# Patient Record
Sex: Male | Born: 1980 | Race: Black or African American | Hispanic: No | Marital: Married | Smoking: Former smoker
Health system: Southern US, Community
[De-identification: ages and names within clinical notes are randomized; demographics above are authoritative.]

## PROBLEM LIST (undated history)

## (undated) DIAGNOSIS — I10 Essential (primary) hypertension: Secondary | ICD-10-CM

## (undated) DIAGNOSIS — J45909 Unspecified asthma, uncomplicated: Secondary | ICD-10-CM

## (undated) DIAGNOSIS — G4730 Sleep apnea, unspecified: Secondary | ICD-10-CM

## (undated) HISTORY — DX: Essential (primary) hypertension: I10

---
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
## 2002-06-22 ENCOUNTER — Emergency Department (HOSPITAL_COMMUNITY): Admission: EM | Admit: 2002-06-22 | Discharge: 2002-06-22 | Payer: Self-pay | Admitting: Emergency Medicine

## 2002-10-02 ENCOUNTER — Emergency Department (HOSPITAL_COMMUNITY): Admission: EM | Admit: 2002-10-02 | Discharge: 2002-10-02 | Payer: Self-pay | Admitting: Emergency Medicine

## 2002-10-02 ENCOUNTER — Encounter: Payer: Self-pay | Admitting: Emergency Medicine

## 2002-10-14 ENCOUNTER — Emergency Department (HOSPITAL_COMMUNITY): Admission: EM | Admit: 2002-10-14 | Discharge: 2002-10-14 | Payer: Self-pay | Admitting: Emergency Medicine

## 2002-10-14 ENCOUNTER — Encounter: Payer: Self-pay | Admitting: Emergency Medicine

## 2003-09-22 ENCOUNTER — Emergency Department (HOSPITAL_COMMUNITY): Admission: EM | Admit: 2003-09-22 | Discharge: 2003-09-22 | Payer: Self-pay

## 2004-05-15 ENCOUNTER — Ambulatory Visit: Payer: Self-pay | Admitting: Nurse Practitioner

## 2004-06-14 ENCOUNTER — Emergency Department (HOSPITAL_COMMUNITY): Admission: EM | Admit: 2004-06-14 | Discharge: 2004-06-14 | Payer: Self-pay | Admitting: Emergency Medicine

## 2004-07-11 ENCOUNTER — Emergency Department (HOSPITAL_COMMUNITY): Admission: EM | Admit: 2004-07-11 | Discharge: 2004-07-11 | Payer: Self-pay | Admitting: Family Medicine

## 2005-04-09 ENCOUNTER — Emergency Department (HOSPITAL_COMMUNITY): Admission: EM | Admit: 2005-04-09 | Discharge: 2005-04-09 | Payer: Self-pay | Admitting: Family Medicine

## 2005-09-02 ENCOUNTER — Emergency Department (HOSPITAL_COMMUNITY): Admission: EM | Admit: 2005-09-02 | Discharge: 2005-09-03 | Payer: Self-pay | Admitting: Emergency Medicine

## 2005-10-24 ENCOUNTER — Emergency Department (HOSPITAL_COMMUNITY): Admission: EM | Admit: 2005-10-24 | Discharge: 2005-10-24 | Payer: Self-pay | Admitting: Family Medicine

## 2006-10-20 ENCOUNTER — Emergency Department (HOSPITAL_COMMUNITY): Admission: EM | Admit: 2006-10-20 | Discharge: 2006-10-20 | Payer: Self-pay | Admitting: Emergency Medicine

## 2006-10-26 ENCOUNTER — Emergency Department (HOSPITAL_COMMUNITY): Admission: EM | Admit: 2006-10-26 | Discharge: 2006-10-26 | Payer: Self-pay | Admitting: Family Medicine

## 2006-12-15 ENCOUNTER — Emergency Department (HOSPITAL_COMMUNITY): Admission: EM | Admit: 2006-12-15 | Discharge: 2006-12-15 | Payer: Self-pay | Admitting: Emergency Medicine

## 2007-08-19 ENCOUNTER — Emergency Department (HOSPITAL_COMMUNITY): Admission: EM | Admit: 2007-08-19 | Discharge: 2007-08-19 | Payer: Self-pay | Admitting: Emergency Medicine

## 2008-03-23 ENCOUNTER — Emergency Department (HOSPITAL_COMMUNITY): Admission: EM | Admit: 2008-03-23 | Discharge: 2008-03-23 | Payer: Self-pay | Admitting: Emergency Medicine

## 2008-03-26 ENCOUNTER — Emergency Department (HOSPITAL_COMMUNITY): Admission: EM | Admit: 2008-03-26 | Discharge: 2008-03-26 | Payer: Self-pay | Admitting: Family Medicine

## 2009-05-11 ENCOUNTER — Emergency Department (HOSPITAL_COMMUNITY): Admission: EM | Admit: 2009-05-11 | Discharge: 2009-05-11 | Payer: Self-pay | Admitting: Emergency Medicine

## 2010-04-25 ENCOUNTER — Emergency Department (HOSPITAL_COMMUNITY): Admission: EM | Admit: 2010-04-25 | Discharge: 2010-04-25 | Payer: Self-pay | Admitting: Family Medicine

## 2011-03-21 ENCOUNTER — Emergency Department (HOSPITAL_COMMUNITY)
Admission: EM | Admit: 2011-03-21 | Discharge: 2011-03-22 | Disposition: A | Discharge status: Home / Self Care | Payer: Self-pay | Attending: Emergency Medicine | Admitting: Emergency Medicine

## 2011-03-21 DIAGNOSIS — R110 Nausea: Secondary | ICD-10-CM | POA: Insufficient documentation

## 2011-03-21 DIAGNOSIS — R0602 Shortness of breath: Secondary | ICD-10-CM | POA: Insufficient documentation

## 2011-03-21 DIAGNOSIS — R0789 Other chest pain: Secondary | ICD-10-CM | POA: Insufficient documentation

## 2011-03-21 DIAGNOSIS — J45909 Unspecified asthma, uncomplicated: Secondary | ICD-10-CM | POA: Insufficient documentation

## 2011-03-22 ENCOUNTER — Emergency Department (HOSPITAL_COMMUNITY): Payer: Self-pay

## 2011-05-30 LAB — CULTURE, ROUTINE-ABSCESS

## 2013-03-07 ENCOUNTER — Encounter (HOSPITAL_COMMUNITY): Payer: Self-pay | Admitting: Emergency Medicine

## 2013-03-07 ENCOUNTER — Other Ambulatory Visit (HOSPITAL_COMMUNITY)
Admission: RE | Admit: 2013-03-07 | Discharge: 2013-03-07 | Disposition: A | Discharge status: Home / Self Care | Payer: Self-pay | Source: Ambulatory Visit | Attending: Emergency Medicine | Admitting: Emergency Medicine

## 2013-03-07 ENCOUNTER — Emergency Department (INDEPENDENT_AMBULATORY_CARE_PROVIDER_SITE_OTHER)
Admission: EM | Admit: 2013-03-07 | Discharge: 2013-03-07 | Disposition: A | Discharge status: Home / Self Care | Payer: Self-pay | Source: Home / Self Care

## 2013-03-07 DIAGNOSIS — Z9189 Other specified personal risk factors, not elsewhere classified: Secondary | ICD-10-CM

## 2013-03-07 DIAGNOSIS — Z202 Contact with and (suspected) exposure to infections with a predominantly sexual mode of transmission: Secondary | ICD-10-CM

## 2013-03-07 DIAGNOSIS — Z113 Encounter for screening for infections with a predominantly sexual mode of transmission: Secondary | ICD-10-CM | POA: Insufficient documentation

## 2013-03-07 HISTORY — DX: Unspecified asthma, uncomplicated: J45.909

## 2013-03-07 LAB — POCT URINALYSIS DIP (DEVICE)
Bilirubin Urine: NEGATIVE
Glucose, UA: NEGATIVE mg/dL
Ketones, ur: NEGATIVE mg/dL
Leukocytes, UA: NEGATIVE
Nitrite: NEGATIVE

## 2013-03-07 LAB — HIV ANTIBODY (ROUTINE TESTING W REFLEX): HIV: NONREACTIVE

## 2013-03-07 LAB — RPR: RPR Ser Ql: NONREACTIVE

## 2013-03-07 MED ORDER — CEFTRIAXONE SODIUM 1 G IJ SOLR
INTRAMUSCULAR | Status: AC
  Filled 2013-03-07: qty 10

## 2013-03-07 MED ORDER — LIDOCAINE HCL (PF) 1 % IJ SOLN
INTRAMUSCULAR | Status: AC
  Filled 2013-03-07: qty 5

## 2013-03-07 MED ORDER — CEFTRIAXONE SODIUM 1 G IJ SOLR
1.0000 g | Freq: Once | INTRAMUSCULAR | Status: AC
  Administered 2013-03-07: 1 g via INTRAMUSCULAR

## 2013-03-07 MED ORDER — AZITHROMYCIN 250 MG PO TABS
ORAL_TABLET | ORAL | Status: AC
  Filled 2013-03-07: qty 4

## 2013-03-07 MED ORDER — AZITHROMYCIN 250 MG PO TABS
1000.0000 mg | ORAL_TABLET | Freq: Once | ORAL | Status: AC
  Administered 2013-03-07: 1000 mg via ORAL

## 2013-03-07 NOTE — ED Notes (Signed)
Pt c/o discharge and itching on penis x 1 day. Had unprotected sex x 4 days ago. Denies abdominal pain, fever, or rash. No burning with urination. Patient is alert and oriented.

## 2013-03-11 NOTE — ED Provider Notes (Signed)
   History    CSN: [id number] Arrival date & time 03/07/13  1054  First MD Initiated Contact with Patient 03/07/13 1255     Chief Complaint  Patient presents with  . SEXUALLY TRANSMITTED DISEASE   (Consider location/radiation/quality/duration/timing/severity/associated sxs/prior Treatment) HPI   32 yo bm presents with penile discharge and itching x one day.  Had unprotected sex 4 days ago.  Discharge is white.  No dysuria, hematuria, abd pain, testicular pain.  No other complaints.   Past Medical History  Diagnosis Date  . Asthma    History reviewed. No pertinent past surgical history. History reviewed. No pertinent family history. History  Substance Use Topics  . Smoking status: Current Every Day Smoker -- 1.00 packs/day    Types: Cigarettes  . Smokeless tobacco: Not on file  . Alcohol Use: Yes     Comment: occasionally    Review of Systems  Constitutional: Negative.   HENT: Negative.   Eyes: Negative.   Respiratory: Negative.   Cardiovascular: Negative.   Gastrointestinal: Negative.   Endocrine: Negative.   Genitourinary: Positive for discharge. Negative for dysuria, urgency, frequency, hematuria, flank pain, decreased urine volume, penile swelling, scrotal swelling, enuresis, genital sores, penile pain and testicular pain.  Neurological: Negative.   Psychiatric/Behavioral: Negative.     Allergies  Review of patient's allergies indicates no known allergies.  Home Medications  No current outpatient prescriptions on file. BP 157/91  Pulse 64  Temp(Src) 98 F (36.7 C) (Oral)  Resp 14  SpO2 97% Physical Exam  Constitutional: He is oriented to person, place, and time. He appears well-developed and well-nourished.  HENT:  Head: Normocephalic and atraumatic.  Eyes: EOM are normal. Pupils are equal, round, and reactive to light.  Neck: Normal range of motion.  Cardiovascular: Normal rate and regular rhythm.   Pulmonary/Chest: Effort normal and breath sounds normal.   Abdominal: Soft. He exhibits no distension and no mass. There is no tenderness. There is no rebound and no guarding.  Genitourinary: Penis normal. No penile tenderness.  Musculoskeletal: Normal range of motion.  Neurological: He is alert and oriented to person, place, and time.  Skin: Skin is warm and dry.  Psychiatric: He has a normal mood and affect.    ED Course  Procedures (including critical care time) Labs Reviewed  RPR  HIV ANTIBODY (ROUTINE TESTING)  POCT URINALYSIS DIP (DEVICE)  URINE CYTOLOGY ANCILLARY ONLY   No results found. 1. Possible exposure to STD     MDM  Will go ahead and treat for possible urethritis.  Will contact him with results.  Return 3-5 days if continues to be symptomatic.  All questions answered.   Meds ordered this encounter  Medications  . cefTRIAXone (ROCEPHIN) injection 1 g    Sig:   . azithromycin (ZITHROMAX) tablet 1,000 mg    Sig:     Zonia Kief, PA-C 03/11/13 1425  Zonia Kief, PA-C 03/11/13 1426

## 2013-03-13 NOTE — ED Provider Notes (Signed)
Medical screening examination/treatment/procedure(s) were performed by non-physician practitioner and as supervising physician I was immediately available for consultation/collaboration.  Leslee Home, M.D.   Reuben Likes, MD 03/13/13 [phone]

## 2013-04-16 ENCOUNTER — Emergency Department (HOSPITAL_COMMUNITY)
Admission: EM | Admit: 2013-04-16 | Discharge: 2013-04-16 | Disposition: A | Discharge status: Home / Self Care | Payer: Self-pay | Attending: Emergency Medicine | Admitting: Emergency Medicine

## 2013-04-16 ENCOUNTER — Encounter (HOSPITAL_COMMUNITY): Payer: Self-pay | Admitting: *Deleted

## 2013-04-16 ENCOUNTER — Emergency Department (HOSPITAL_COMMUNITY): Payer: Self-pay

## 2013-04-16 DIAGNOSIS — J069 Acute upper respiratory infection, unspecified: Secondary | ICD-10-CM

## 2013-04-16 DIAGNOSIS — R6883 Chills (without fever): Secondary | ICD-10-CM | POA: Insufficient documentation

## 2013-04-16 DIAGNOSIS — R079 Chest pain, unspecified: Secondary | ICD-10-CM | POA: Insufficient documentation

## 2013-04-16 DIAGNOSIS — J45901 Unspecified asthma with (acute) exacerbation: Secondary | ICD-10-CM | POA: Insufficient documentation

## 2013-04-16 DIAGNOSIS — R0981 Nasal congestion: Secondary | ICD-10-CM

## 2013-04-16 DIAGNOSIS — J029 Acute pharyngitis, unspecified: Secondary | ICD-10-CM | POA: Insufficient documentation

## 2013-04-16 DIAGNOSIS — R609 Edema, unspecified: Secondary | ICD-10-CM | POA: Insufficient documentation

## 2013-04-16 DIAGNOSIS — F17200 Nicotine dependence, unspecified, uncomplicated: Secondary | ICD-10-CM | POA: Insufficient documentation

## 2013-04-16 DIAGNOSIS — J40 Bronchitis, not specified as acute or chronic: Secondary | ICD-10-CM

## 2013-04-16 DIAGNOSIS — J3489 Other specified disorders of nose and nasal sinuses: Secondary | ICD-10-CM | POA: Insufficient documentation

## 2013-04-16 DIAGNOSIS — R0602 Shortness of breath: Secondary | ICD-10-CM | POA: Insufficient documentation

## 2013-04-16 LAB — CBC WITH DIFFERENTIAL/PLATELET
Basophils Absolute: 0.1 10*3/uL (ref 0.0–0.1)
Basophils Relative: 1 % (ref 0–1)
Eosinophils Absolute: 0.6 K/uL (ref 0.0–0.7)
Eosinophils Relative: 7 % — ABNORMAL HIGH (ref 0–5)
HCT: 46.8 % (ref 39.0–52.0)
Hemoglobin: 16.4 g/dL (ref 13.0–17.0)
Lymphocytes Relative: 20 % (ref 12–46)
Lymphs Abs: 1.8 10*3/uL (ref 0.7–4.0)
MCH: 29 pg (ref 26.0–34.0)
MCHC: 35 g/dL (ref 30.0–36.0)
MCV: 82.8 fL (ref 78.0–100.0)
Monocytes Absolute: 1 10*3/uL (ref 0.1–1.0)
Monocytes Relative: 11 % (ref 3–12)
Neutro Abs: 5.5 10*3/uL (ref 1.7–7.7)
Neutrophils Relative %: 61 % (ref 43–77)
Platelets: 294 10*3/uL (ref 150–400)
RBC: 5.65 MIL/uL (ref 4.22–5.81)
RDW: 13.5 % (ref 11.5–15.5)
WBC: 9.1 10*3/uL (ref 4.0–10.5)

## 2013-04-16 LAB — BASIC METABOLIC PANEL WITH GFR
BUN: 9 mg/dL (ref 6–23)
Calcium: 9.6 mg/dL (ref 8.4–10.5)
Creatinine, Ser: 1 mg/dL (ref 0.50–1.35)
GFR calc non Af Amer: >90 mL/min (ref >90)
Glucose, Bld: 112 mg/dL — ABNORMAL HIGH (ref 70–99)
Potassium: 4.1 meq/L (ref 3.5–5.1)

## 2013-04-16 LAB — BASIC METABOLIC PANEL
CO2: 24 mEq/L (ref 19–32)
Chloride: 103 mEq/L (ref 96–112)
GFR calc Af Amer: >90 mL/min (ref >90)
Sodium: 137 mEq/L (ref 135–145)

## 2013-04-16 LAB — POCT I-STAT TROPONIN I: Troponin i, poc: 0.01 ng/mL (ref 0.00–0.08)

## 2013-04-16 MED ORDER — PREDNISONE 20 MG PO TABS
60.0000 mg | ORAL_TABLET | Freq: Once | ORAL | Status: AC
  Administered 2013-04-16: 60 mg via ORAL
  Filled 2013-04-16: qty 3

## 2013-04-16 MED ORDER — ALBUTEROL SULFATE HFA 108 (90 BASE) MCG/ACT IN AERS
1.0000 | INHALATION_SPRAY | Freq: Four times a day (QID) | RESPIRATORY_TRACT | Status: DC | PRN
  Administered 2013-04-16: 2 via RESPIRATORY_TRACT
  Filled 2013-04-16: qty 6.7

## 2013-04-16 MED ORDER — HYDROCOD POLST-CHLORPHEN POLST 10-8 MG/5ML PO LQCR: 5.0000 mL | Freq: Two times a day (BID) | ORAL | Status: DC | PRN

## 2013-04-16 MED ORDER — PREDNISONE 20 MG PO TABS: 40.0000 mg | ORAL_TABLET | Freq: Every day | ORAL | Status: DC

## 2013-04-16 MED ORDER — TRIAMCINOLONE ACETONIDE(NASAL) 55 MCG/ACT NA INHA: 2.0000 | Freq: Every day | NASAL | Status: DC

## 2013-04-16 NOTE — ED Notes (Signed)
Pt with cough and upper airway congestion x 2 days.  States chest and back (beneath R shoulder blade) began hurting yesterday, esp when coughing.  States coughing up yellow sputum.

## 2013-04-16 NOTE — Discharge Instructions (Signed)
 Bronchitis Bronchitis is the body's way of reacting to injury and/or infection (inflammation) of the bronchi. Bronchi are the air tubes that extend from the windpipe into the lungs. If the inflammation becomes severe, it may cause shortness of breath. CAUSES  Inflammation may be caused by:  A virus.  Germs (bacteria).  Dust.  Allergens.  Pollutants and many other irritants. The cells lining the bronchial tree are covered with tiny hairs (cilia). These constantly beat upward, away from the lungs, toward the mouth. This keeps the lungs free of pollutants. When these cells become too irritated and are unable to do their job, mucus begins to develop. This causes the characteristic cough of bronchitis. The cough clears the lungs when the cilia are unable to do their job. Without either of these protective mechanisms, the mucus would settle in the lungs. Then you would develop pneumonia. Smoking is a common cause of bronchitis and can contribute to pneumonia. Stopping this habit is the single most important thing you can do to help yourself. TREATMENT   Your caregiver may prescribe an antibiotic if the cough is caused by bacteria. Also, medicines that open up your airways make it easier to breathe. Your caregiver may also recommend or prescribe an expectorant. It will loosen the mucus to be coughed up. Only take over-the-counter or prescription medicines for pain, discomfort, or fever as directed by your caregiver.  Removing whatever causes the problem (smoking, for example) is critical to preventing the problem from getting worse.  Cough suppressants may be prescribed for relief of cough symptoms.  Inhaled medicines may be prescribed to help with symptoms now and to help prevent problems from returning.  For those with recurrent (chronic) bronchitis, there may be a need for steroid medicines. SEEK IMMEDIATE MEDICAL CARE IF:   During treatment, you develop more pus-like mucus (purulent  sputum).  You have a fever.  Your baby is older than 3 months with a rectal temperature of 102 F (38.9 C) or higher.  Your baby is 42 months old or younger with a rectal temperature of 100.4 F (38 C) or higher.  You become progressively more ill.  You have increased difficulty breathing, wheezing, or shortness of breath. It is necessary to seek immediate medical care if you are elderly or sick from any other disease. MAKE SURE YOU:   Understand these instructions.  Will watch your condition.  Will get help right away if you are not doing well or get worse. Document Released: 08/18/2005 Document Revised: 11/10/2011 Document Reviewed: 06/27/2008 [facility] Patient Information 2014 [place], [place].    Upper Respiratory Infection, Adult An upper respiratory infection (URI) is also sometimes known as the common cold. The upper respiratory tract includes the nose, sinuses, throat, trachea, and bronchi. Bronchi are the airways leading to the lungs. Most people improve within 1 week, but symptoms can last up to 2 weeks. A residual cough may last even longer.  CAUSES Many different viruses can infect the tissues lining the upper respiratory tract. The tissues become irritated and inflamed and often become very moist. Mucus production is also common. A cold is contagious. You can easily spread the virus to others by oral contact. This includes kissing, sharing a glass, coughing, or sneezing. Touching your mouth or nose and then touching a surface, which is then touched by another person, can also spread the virus. SYMPTOMS  Symptoms typically develop 1 to 3 days after you come in contact with a cold virus. Symptoms vary from person to person.  They may include:  Runny nose.  Sneezing.  Nasal congestion.  Sinus irritation.  Sore throat.  Loss of voice (laryngitis).  Cough.  Fatigue.  Muscle aches.  Loss of appetite.  Headache.  Low-grade fever. DIAGNOSIS  You might  diagnose your own cold based on familiar symptoms, since most people get a cold 2 to 3 times a year. Your caregiver can confirm this based on your exam. Most importantly, your caregiver can check that your symptoms are not due to another disease such as strep throat, sinusitis, pneumonia, asthma, or epiglottitis. Blood tests, throat tests, and X-rays are not necessary to diagnose a common cold, but they may sometimes be helpful in excluding other more serious diseases. Your caregiver will decide if any further tests are required. RISKS AND COMPLICATIONS  You may be at risk for a more severe case of the common cold if you smoke cigarettes, have chronic heart disease (such as heart failure) or lung disease (such as asthma), or if you have a weakened immune system. The very young and very old are also at risk for more serious infections. Bacterial sinusitis, middle ear infections, and bacterial pneumonia can complicate the common cold. The common cold can worsen asthma and chronic obstructive pulmonary disease (COPD). Sometimes, these complications can require emergency medical care and may be life-threatening. PREVENTION  The best way to protect against getting a cold is to practice good hygiene. Avoid oral or hand contact with people with cold symptoms. Wash your hands often if contact occurs. There is no clear evidence that vitamin C, vitamin E, echinacea, or exercise reduces the chance of developing a cold. However, it is always recommended to get plenty of rest and practice good nutrition. TREATMENT  Treatment is directed at relieving symptoms. There is no cure. Antibiotics are not effective, because the infection is caused by a virus, not by bacteria. Treatment may include:  Increased fluid intake. Sports drinks offer valuable electrolytes, sugars, and fluids.  Breathing heated mist or steam (vaporizer or shower).  Eating chicken soup or other clear broths, and maintaining good nutrition.  Getting  plenty of rest.  Using gargles or lozenges for comfort.  Controlling fevers with ibuprofen  or acetaminophen  as directed by your caregiver.  Increasing usage of your inhaler if you have asthma. Zinc gel and zinc lozenges, taken in the first 24 hours of the common cold, can shorten the duration and lessen the severity of symptoms. Pain medicines may help with fever, muscle aches, and throat pain. A variety of non-prescription medicines are available to treat congestion and runny nose. Your caregiver can make recommendations and may suggest nasal or lung inhalers for other symptoms.  HOME CARE INSTRUCTIONS   Only take over-the-counter or prescription medicines for pain, discomfort, or fever as directed by your caregiver.  Use a warm mist humidifier or inhale steam from a shower to increase air moisture. This may keep secretions moist and make it easier to breathe.  Drink enough water and fluids to keep your urine clear or pale yellow.  Rest as needed.  Return to work when your temperature has returned to normal or as your caregiver advises. You may need to stay home longer to avoid infecting others. You can also use a face mask and careful hand washing to prevent spread of the virus. SEEK MEDICAL CARE IF:   After the first few days, you feel you are getting worse rather than better.  You need your caregiver's advice about medicines to control symptoms.  You  develop chills, worsening shortness of breath, or brown or red sputum. These may be signs of pneumonia.  You develop yellow or brown nasal discharge or pain in the face, especially when you bend forward. These may be signs of sinusitis.  You develop a fever, swollen neck glands, pain with swallowing, or white areas in the back of your throat. These may be signs of strep throat. SEEK IMMEDIATE MEDICAL CARE IF:   You have a fever.  You develop severe or persistent headache, ear pain, sinus pain, or chest pain.  You develop wheezing,  a prolonged cough, cough up blood, or have a change in your usual mucus (if you have chronic lung disease).  You develop sore muscles or a stiff neck. Document Released: 02/11/2001 Document Revised: 11/10/2011 Document Reviewed: 12/20/2010 [facility] Patient Information 2014 [place], [place].    Narcotic and benzodiazepine use may cause drowsiness, slowed breathing or dependence.  Please use with caution and do not drive, operate machinery or watch young children alone while taking them.  Taking combinations of these medications or drinking alcohol will potentiate these effects.

## 2013-04-16 NOTE — ED Provider Notes (Signed)
CSN: [id number]     Arrival date & time 04/16/13  1624 History     First MD Initiated Contact with Patient 04/16/13 1814     Chief Complaint  Patient presents with  . Cough  . Chest Pain   (Consider location/radiation/quality/duration/timing/severity/associated sxs/prior Treatment) Patient is a 32 y.o. male presenting with cough and chest pain. The history is provided by the patient.  Cough Cough characteristics:  Productive Sputum characteristics:  Yellow Severity:  Moderate Onset quality:  Gradual Duration:  3 days Timing:  Constant Progression:  Unchanged Chronicity:  New Smoker: former smoker.   Context: upper respiratory infection and weather changes   Context: not sick contacts   Relieved by:  Nothing Ineffective treatments:  Decongestant and cough suppressants Associated symptoms: chest pain, chills, rhinorrhea, shortness of breath, sinus congestion and sore throat   Associated symptoms: no fever   Risk factors: no recent infection and no recent travel   Chest Pain Associated symptoms: cough and shortness of breath   Associated symptoms: no fever     Past Medical History  Diagnosis Date  . Asthma    History reviewed. No pertinent past surgical history. History reviewed. No pertinent family history. History  Substance Use Topics  . Smoking status: Current Every Day Smoker -- 1.00 packs/day    Types: Cigarettes  . Smokeless tobacco: Not on file  . Alcohol Use: Yes     Comment: occasionally    Review of Systems  Constitutional: Positive for chills. Negative for fever.  HENT: Positive for congestion, sore throat, rhinorrhea and sinus pressure.   Respiratory: Positive for cough and shortness of breath.   Cardiovascular: Positive for chest pain.  All other systems reviewed and are negative.    Allergies  Review of patient's allergies indicates no known allergies.  Home Medications   Current Outpatient Rx  Name  Route  Sig  Dispense  Refill  .  diphenhydrAMINE (BENADRYL) 25 MG tablet   Oral   Take 50 mg by mouth daily as needed (runny nose).         . GuaiFENesin (MUCINEX MAXIMUM STRENGTH PO)   Oral   Take 5 mL by mouth every 6 (six) hours as needed (congestion).         . Phenylephrine-DM-GG-APAP (TYLENOL COLD/FLU SEVERE PO)   Oral   Take 5 mL by mouth every 6 (six) hours as needed (for cold symptoms).         . chlorpheniramine-HYDROcodone (TUSSIONEX PENNKINETIC ER) 10-8 MG/5ML LQCR   Oral   Take 5 mL by mouth every 12 (twelve) hours as needed.   80 mL   0   . predniSONE (DELTASONE) 20 MG tablet   Oral   Take 2 tablets (40 mg total) by mouth daily.   12 tablet   0   . triamcinolone (NASACORT AQ) 55 MCG/ACT nasal inhaler   Nasal   Place 2 sprays into the nose daily. Do not use more than 2 weeks   1 Inhaler   0    BP 159/91  Pulse 86  Temp(Src) 98.4 F (36.9 C) (Oral)  Resp 20  SpO2 97% Physical Exam  Nursing note and vitals reviewed. Constitutional: He is oriented to person, place, and time. He appears well-developed and well-nourished.  HENT:  Head: Normocephalic and atraumatic.  Nose: Mucosal edema, rhinorrhea and sinus tenderness present. Right sinus exhibits maxillary sinus tenderness. Left sinus exhibits maxillary sinus tenderness.  Mouth/Throat: Uvula is midline, oropharynx is clear and moist and mucous  membranes are normal.  Eyes: Conjunctivae are normal. Right eye exhibits no discharge. Left eye exhibits no discharge. No scleral icterus.  Neck: Normal range of motion. Neck supple.  Cardiovascular: Normal rate, regular rhythm and intact distal pulses.   No murmur heard. Pulmonary/Chest: No respiratory distress. He has wheezes. He has no rales.  Abdominal: Soft.  Musculoskeletal: Normal range of motion. He exhibits no tenderness.  Neurological: He is alert and oriented to person, place, and time. He exhibits normal muscle tone. Coordination normal.  Skin: Skin is warm and dry. No rash noted.   Psychiatric: He has a normal mood and affect.    ED Course   Procedures (including critical care time)  Labs Reviewed  BASIC METABOLIC PANEL - Abnormal; Notable for the following:    Glucose, Bld 112 (*)    All other components within normal limits  CBC WITH DIFFERENTIAL - Abnormal; Notable for the following:    Eosinophils Relative 7 (*)    All other components within normal limits  POCT I-STAT TROPONIN I   Dg Chest 2 View (if Patient Has Fever And/or Copd)  04/16/2013   *RADIOLOGY REPORT*  Clinical Data: Chest pain and cough  CHEST - 2 VIEW  Comparison:  March 22, 2011  Findings:  Lungs clear.  Heart size and pulmonary vascularity are normal.  No adenopathy.  No bone lesions.  No pneumothorax.  IMPRESSION: No abnormality noted.   Original Report Authenticated By: Bretta Bang, M.D.   1. Bronchitis   2. Nasal congestion   3. URI (upper respiratory infection)     ra sat is 97% and I interpret to be adequate  MDM  Pt with nasal congestion, sinus congestion and likely URI causing post nasal drip and thus sore throat and cough.  Pt with yellow sputum consistent with bronchitis as well, mild whezing on exam.  No stridor.  Pt with paroxysmal coughing during exam.  Will give nasal decongestant, steroids, inhlaer and cough suppressant.  Pt wil improe over next several days.  No fever, not toxic appearing here.    Gavin Pound. Oletta Lamas, MD 04/16/13 [phone]

## 2013-06-26 ENCOUNTER — Emergency Department (HOSPITAL_COMMUNITY)
Admission: EM | Admit: 2013-06-26 | Discharge: 2013-06-26 | Disposition: A | Discharge status: Home / Self Care | Payer: Self-pay | Attending: Emergency Medicine | Admitting: Emergency Medicine

## 2013-06-26 ENCOUNTER — Encounter (HOSPITAL_COMMUNITY): Payer: Self-pay | Admitting: Emergency Medicine

## 2013-06-26 DIAGNOSIS — T63461A Toxic effect of venom of wasps, accidental (unintentional), initial encounter: Secondary | ICD-10-CM | POA: Insufficient documentation

## 2013-06-26 DIAGNOSIS — Y929 Unspecified place or not applicable: Secondary | ICD-10-CM | POA: Insufficient documentation

## 2013-06-26 DIAGNOSIS — F17200 Nicotine dependence, unspecified, uncomplicated: Secondary | ICD-10-CM | POA: Insufficient documentation

## 2013-06-26 DIAGNOSIS — Y939 Activity, unspecified: Secondary | ICD-10-CM | POA: Insufficient documentation

## 2013-06-26 DIAGNOSIS — T6391XA Toxic effect of contact with unspecified venomous animal, accidental (unintentional), initial encounter: Secondary | ICD-10-CM | POA: Insufficient documentation

## 2013-06-26 DIAGNOSIS — J45909 Unspecified asthma, uncomplicated: Secondary | ICD-10-CM | POA: Insufficient documentation

## 2013-06-26 DIAGNOSIS — W57XXXA Bitten or stung by nonvenomous insect and other nonvenomous arthropods, initial encounter: Secondary | ICD-10-CM

## 2013-06-26 MED ORDER — DIPHENHYDRAMINE HCL 25 MG PO CAPS
50.0000 mg | ORAL_CAPSULE | Freq: Once | ORAL | Status: AC
  Administered 2013-06-26: 50 mg via ORAL
  Filled 2013-06-26: qty 2

## 2013-06-26 NOTE — ED Provider Notes (Signed)
Medical screening examination/treatment/procedure(s) were performed by non-physician practitioner and as supervising physician I was immediately available for consultation/collaboration.  EKG Interpretation   None         Gwyneth Sprout, MD 06/26/13 2250

## 2013-06-26 NOTE — ED Provider Notes (Signed)
CSN: [id number]     Arrival date & time 06/26/13  1534 History   First MD Initiated Contact with Patient 06/26/13 1542    This chart was scribed for Luis Drape PA-C, a non-physician practitioner working with Gwyneth Sprout, MD by Lewanda Rife, ED Scribe. This patient was seen in room TR05C/TR05C and the patient's care was started at 4:39 PM     Chief Complaint  Patient presents with  . Insect Bite   (Consider location/radiation/quality/duration/timing/severity/associated sxs/prior Treatment) The history is provided by the patient. No language interpreter was used.   HPI Comments: Luis Beard is a 32 y.o. 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 Reports associated constant, but improving pain and swelling to site. Denies any aggravating or alleviating factors. Denies taking any medications PTA to relieve symptoms. Denies associated difficulty breathing, lip swelling, and tongue swelling.   Past Medical History  Diagnosis Date  . Asthma    No past surgical history on file. History reviewed. No pertinent family history. History  Substance Use Topics  . Smoking status: Current Every Day Smoker -- 1.00 packs/day    Types: Cigarettes  . Smokeless tobacco: Not on file  . Alcohol Use: Yes     Comment: occasionally    Review of Systems  Respiratory: Negative for shortness of breath.   Skin: Positive for rash.  All other systems reviewed and are negative.   A complete 10 system review of systems was obtained and all systems are negative except as noted in the HPI and PMHx.   Allergies  Review of patient's allergies indicates no known allergies.  Home Medications  No current outpatient prescriptions on file. BP 144/95  Pulse 67  Temp(Src) 97.8 F (36.6 C) (Oral)  Resp 20  SpO2 97% Physical  Exam  Nursing note and vitals reviewed. Constitutional: He is oriented to person, place, and time. He appears well-developed and well-nourished. No distress.  Speaking in complete sentences not in apparent distress   HENT:  Head: Normocephalic and atraumatic.  Mouth/Throat: Uvula is midline and oropharynx is clear and moist. No posterior oropharyngeal edema.  Airway patent   Eyes: Conjunctivae and EOM are normal.  Neck: Neck supple. No tracheal deviation present.  Cardiovascular: Normal rate, regular rhythm and normal heart sounds.  Exam reveals no gallop.   No murmur heard. Pulmonary/Chest: Effort normal and breath sounds normal. No respiratory distress. He has no wheezes. He has no rales. He exhibits no tenderness.  Musculoskeletal: Normal range of motion.  Neurological: He is alert and oriented to person, place, and time.  Skin: Skin is warm and dry. No rash noted.  Skin was clear, with no evidence of rash   Psychiatric: He has a normal mood and affect. His behavior is normal.    ED Course  Procedures  COORDINATION OF CARE:  Nursing notes reviewed. Vital signs reviewed. Initial pt interview and examination performed.   5:13 PM Pt still in no apparent distress. No rash, and no difficulty breathing. Pt informed of return precautions and is comfortable with discharge at this time.      Treatment plan initiated: Medications  diphenhydrAMINE (BENADRYL) capsule 50 mg (50 mg Oral Given 06/26/13 1648)   Initial diagnostic testing ordered.    EKG Interpretation   None       MDM   1. Insect bite  Patient with bee sting.  Childhood allergy.  No evidence of anaphalaxis.  No respiratory distress.  Observed for 2 hour post sting.  No change.  Continue benadryl.  Return precautions given.  Patient is stable and ready for discharge.  I personally performed the services described in this documentation, which was scribed in my presence. The recorded information has been reviewed and  is accurate.     Roxy Horseman, PA-C 06/26/13 [id number]

## 2013-06-26 NOTE — ED Notes (Signed)
Pt presents to department for evaluation of possible allergic reaction. States he was stung by yellow jacket this afternoon. Welt noted to L neck. Denies breathing problems, respirations unlabored, speaking complete sentences. Pt is alert and oriented x4.

## 2013-11-13 ENCOUNTER — Encounter (HOSPITAL_COMMUNITY): Payer: Self-pay | Admitting: Emergency Medicine

## 2013-11-13 ENCOUNTER — Emergency Department (INDEPENDENT_AMBULATORY_CARE_PROVIDER_SITE_OTHER)
Admission: EM | Admit: 2013-11-13 | Discharge: 2013-11-13 | Disposition: A | Discharge status: Home / Self Care | Payer: Self-pay | Source: Home / Self Care | Attending: Family Medicine | Admitting: Family Medicine

## 2013-11-13 ENCOUNTER — Other Ambulatory Visit (HOSPITAL_COMMUNITY)
Admission: RE | Admit: 2013-11-13 | Discharge: 2013-11-13 | Disposition: A | Discharge status: Home / Self Care | Payer: Self-pay | Source: Ambulatory Visit | Attending: Emergency Medicine | Admitting: Emergency Medicine

## 2013-11-13 DIAGNOSIS — Z202 Contact with and (suspected) exposure to infections with a predominantly sexual mode of transmission: Secondary | ICD-10-CM

## 2013-11-13 DIAGNOSIS — Z113 Encounter for screening for infections with a predominantly sexual mode of transmission: Secondary | ICD-10-CM | POA: Insufficient documentation

## 2013-11-13 DIAGNOSIS — R369 Urethral discharge, unspecified: Secondary | ICD-10-CM

## 2013-11-13 MED ORDER — LIDOCAINE HCL (PF) 1 % IJ SOLN
INTRAMUSCULAR | Status: AC
  Filled 2013-11-13: qty 5

## 2013-11-13 MED ORDER — CEFTRIAXONE SODIUM 250 MG IJ SOLR
250.0000 mg | Freq: Once | INTRAMUSCULAR | Status: AC
  Administered 2013-11-13: 250 mg via INTRAMUSCULAR

## 2013-11-13 MED ORDER — AZITHROMYCIN 250 MG PO TABS
1000.0000 mg | ORAL_TABLET | Freq: Once | ORAL | Status: AC
  Administered 2013-11-13: 1000 mg via ORAL

## 2013-11-13 MED ORDER — CEFTRIAXONE SODIUM 250 MG IJ SOLR
INTRAMUSCULAR | Status: AC
  Filled 2013-11-13: qty 250

## 2013-11-13 MED ORDER — AZITHROMYCIN 250 MG PO TABS
ORAL_TABLET | ORAL | Status: AC
  Filled 2013-11-13: qty 4

## 2013-11-13 NOTE — ED Provider Notes (Signed)
Medical screening examination/treatment/procedure(s) were performed by resident physician or non-physician practitioner and as supervising physician I was immediately available for consultation/collaboration.   Shadae Reino DOUGLAS MD.   Milas Schappell D Itzamara Casas, MD 11/13/13 2006 

## 2013-11-13 NOTE — ED Notes (Signed)
Call back number for lab issues verified 

## 2013-11-13 NOTE — ED Provider Notes (Signed)
CSN: [id number]     Arrival date & time 11/13/13  1840 History   First MD Initiated Contact with Patient 11/13/13 1933     Chief Complaint  Patient presents with  . SEXUALLY TRANSMITTED DISEASE    Patient is a 33 y.o. male presenting with penile discharge. The history is provided by the patient.  Penile Discharge This is a new problem. The current episode started 12 to 24 hours ago. The problem occurs constantly. The problem has not changed since onset.Pertinent negatives include no chest pain, no abdominal pain, no headaches and no shortness of breath. He has tried nothing for the symptoms.  Pt reports condom broke during intercourse approx 1 week ago. Yesterday pt began to notice clear and mucoid penile d/c and irritation (almost like itching) when he voids. Suspects he may have been exposed to STD. Denies rash or lesions to genital area.  Past Medical History  Diagnosis Date  . Asthma    History reviewed. No pertinent past surgical history. History reviewed. No pertinent family history. History  Substance Use Topics  . Smoking status: Current Every Day Smoker -- 1.00 packs/day    Types: Cigarettes  . Smokeless tobacco: Not on file  . Alcohol Use: Yes     Comment: occasionally    Review of Systems  Respiratory: Negative for shortness of breath.   Cardiovascular: Negative for chest pain.  Gastrointestinal: Negative for abdominal pain.  Genitourinary: Positive for discharge.  Neurological: Negative for headaches.  All other systems reviewed and are negative.    Allergies  Review of patient's allergies indicates no known allergies.  Home Medications  No current outpatient prescriptions on file. BP 143/76  Pulse 87  Temp(Src) 98 F (36.7 C) (Oral)  Resp 14  SpO2 99% Physical Exam  Constitutional: He is oriented to person, place, and time. He appears well-developed and well-nourished.  HENT:  Head: Normocephalic and atraumatic.  Eyes: Conjunctivae are normal.   Cardiovascular: Normal rate.   Pulmonary/Chest: Effort normal.  Neurological: He is alert and oriented to person, place, and time.  Skin: Skin is warm and dry.  Psychiatric: He has a normal mood and affect.    ED Course  Procedures (including critical care time) Labs Review Labs Reviewed  URINE CYTOLOGY ANCILLARY ONLY   Imaging Review No results found.   MDM   1. Penile discharge   2. Possible exposure to STD    Treated w/ Rocephin 250 mg IM and Zithromax 1 Gm PO. HIV testing offered but declined as he has recently had HIV testing and is neg. STD and safer sex info provided.    Leanne ChangKatherine P Neveah Bang, NP 11/13/13 1945

## 2013-11-13 NOTE — ED Notes (Signed)
Condom broke 2 days ago while having sex with this girl, and today noticed started to have a d/c

## 2013-11-14 LAB — URINE CYTOLOGY ANCILLARY ONLY
Chlamydia: NEGATIVE
Neisseria Gonorrhea: NEGATIVE
Trichomonas: NEGATIVE

## 2013-11-15 LAB — URINE CYTOLOGY ANCILLARY ONLY
Bacterial vaginitis: NEGATIVE
Candida vaginitis: NEGATIVE

## 2014-07-10 ENCOUNTER — Emergency Department (HOSPITAL_COMMUNITY): Payer: Self-pay

## 2014-07-10 ENCOUNTER — Encounter (HOSPITAL_COMMUNITY): Payer: Self-pay

## 2014-07-10 ENCOUNTER — Emergency Department (HOSPITAL_COMMUNITY)
Admission: EM | Admit: 2014-07-10 | Discharge: 2014-07-10 | Disposition: A | Discharge status: Home / Self Care | Payer: Self-pay | Attending: Emergency Medicine | Admitting: Emergency Medicine

## 2014-07-10 DIAGNOSIS — M549 Dorsalgia, unspecified: Secondary | ICD-10-CM | POA: Insufficient documentation

## 2014-07-10 DIAGNOSIS — R059 Cough, unspecified: Secondary | ICD-10-CM

## 2014-07-10 DIAGNOSIS — R05 Cough: Secondary | ICD-10-CM

## 2014-07-10 DIAGNOSIS — J029 Acute pharyngitis, unspecified: Secondary | ICD-10-CM | POA: Insufficient documentation

## 2014-07-10 DIAGNOSIS — J45909 Unspecified asthma, uncomplicated: Secondary | ICD-10-CM | POA: Insufficient documentation

## 2014-07-10 DIAGNOSIS — Z720 Tobacco use: Secondary | ICD-10-CM | POA: Insufficient documentation

## 2014-07-10 MED ORDER — SALINE SPRAY 0.65 % NA SOLN: 1.0000 | NASAL | Status: DC | PRN

## 2014-07-10 MED ORDER — IBUPROFEN 400 MG PO TABS
400.0000 mg | ORAL_TABLET | Freq: Once | ORAL | Status: AC
  Administered 2014-07-10: 400 mg via ORAL
  Filled 2014-07-10: qty 1

## 2014-07-10 MED ORDER — BENZONATATE 100 MG PO CAPS: 200.0000 mg | ORAL_CAPSULE | Freq: Two times a day (BID) | ORAL | Status: DC | PRN

## 2014-07-10 NOTE — ED Provider Notes (Signed)
CSN: [id number]     Arrival date & time 07/10/14  1221 History  This chart was scribed for non-physician practitioner, Roxy Horsemanobert Thiago Ragsdale, PA-C, working with Raeford RazorStephen Kohut, MD by Charline BillsEssence Howell, ED Scribe. This patient was seen in room TR08C/TR08C and the patient's care was started at 12:56 PM.   Chief Complaint  Patient presents with  . Nasal Congestion   The history is provided by the patient. No language interpreter was used.   HPI Comments: Luis Beard is a 33 y.o. male, with a h/o asthma, who presents to the Emergency Department with a chief complaint of constant nasal congestion onset 3 days ago. Pt reports associated HA, back pain, sore throat, productive cough with dark yellow sputum, subjective fever, sinus pressure, rhinorrhea. He has tried Mucinex and Tylenol Cold and Flu without relief.   Past Medical History  Diagnosis Date  . Asthma    History reviewed. No pertinent past surgical history. No family history on file. History  Substance Use Topics  . Smoking status: Current Every Day Smoker -- 1.00 packs/day    Types: Cigarettes  . Smokeless tobacco: Not on file  . Alcohol Use: Yes     Comment: occasionally    Review of Systems  Constitutional: Positive for fever (subjective).  HENT: Positive for congestion, rhinorrhea, sinus pressure and sore throat.   Respiratory: Positive for cough.   Musculoskeletal: Positive for back pain.  Neurological: Positive for headaches.   Allergies  Review of patient's allergies indicates no known allergies.  Home Medications   Prior to Admission medications   Not on File   Triage Vitals: BP 151/82 mmHg  Pulse 65  Temp(Src) 97.7 F (36.5 C) (Oral)  Resp 18  Ht 5\' 8"  (1.727 m)  Wt 250 lb (113.399 kg)  BMI 38.02 kg/m2  SpO2 97% Physical Exam  Constitutional: He is oriented to person, place, and time. He appears well-developed and well-nourished. No distress.  HENT:  Head: Normocephalic and atraumatic.  Mild tenderness to  palpation over the frontal and maxillary sinuses Oropharynx is mildly erythematous  No tonsillar exudates  No abscess Moist mucous membranes   Eyes: Conjunctivae and EOM are normal.  Neck: Neck supple.  Cardiovascular: Normal rate, regular rhythm and normal heart sounds.  Exam reveals no gallop and no friction rub.   No murmur heard. Pulmonary/Chest: Effort normal and breath sounds normal. No respiratory distress. He has no wheezes. He has no rales. He exhibits no tenderness.  Normal breath sounds No wheezes, rales or chest tenderness  Musculoskeletal: Normal range of motion.  Neurological: He is alert and oriented to person, place, and time.  Skin: Skin is warm and dry.  Psychiatric: He has a normal mood and affect. His behavior is normal.  Nursing note and vitals reviewed.  ED Course  Procedures (including critical care time) DIAGNOSTIC STUDIES: Oxygen Saturation is 97% on RA, normal by my interpretation.    COORDINATION OF CARE: 1:00 PM-Discussed treatment plan which includes CXR with pt at bedside and pt agreed to plan.   Labs Review Labs Reviewed - No data to display  Imaging Review Dg Chest 2 View  07/10/2014   CLINICAL DATA:  Cough, congestion, sore throat and body aches for 3 days, history asthma, smoking  EXAM: CHEST  2 VIEW  COMPARISON:  04/16/2013  FINDINGS: Normal heart size, mediastinal contours and pulmonary vascularity.  Lungs clear.  No pleural effusion or pneumothorax.  Bones unremarkable.  IMPRESSION: Normal exam.   Electronically Signed   By: Loraine LericheMark  Tyron RussellBoles M.D.   On: 07/10/2014 13:51    EKG Interpretation None      MDM   Final diagnoses:  Cough    Pt CXR negative for acute infiltrate. Patients symptoms are consistent with URI, likely viral etiology. Discussed that antibiotics are not indicated for viral infections. Pt will be discharged with symptomatic treatment.  Verbalizes understanding and is agreeable with plan. Pt is hemodynamically stable & in NAD  prior to dc.   I personally performed the services described in this documentation, which was scribed in my presence. The recorded information has been reviewed and is accurate.    Roxy Horsemanobert Caysen Whang, PA-C 07/10/14 1545  Raeford RazorStephen Kohut, MD 07/12/14 [phone]

## 2014-07-10 NOTE — ED Notes (Signed)
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 Has been congested feeling since Friday. Has tried OTC meds but nothing is helping.

## 2014-07-10 NOTE — Discharge Instructions (Signed)
Cough, Adult ° A cough is a reflex that helps clear your throat and airways. It can help heal the body or may be a reaction to an irritated airway. A cough may only last 2 or 3 weeks (acute) or may last more than 8 weeks (chronic).  °CAUSES °Acute cough: °· Viral or bacterial infections. °Chronic cough: °· Infections. °· Allergies. °· Asthma. °· Post-nasal drip. °· Smoking. °· Heartburn or acid reflux. °· Some medicines. °· Chronic lung problems (COPD). °· Cancer. °SYMPTOMS  °· Cough. °· Fever. °· Chest pain. °· Increased breathing rate. °· High-pitched whistling sound when breathing (wheezing). °· Colored mucus that you cough up (sputum). °TREATMENT  °· A bacterial cough may be treated with antibiotic medicine. °· A viral cough must run its course and will not respond to antibiotics. °· Your caregiver may recommend other treatments if you have a chronic cough. °HOME CARE INSTRUCTIONS  °· Only take over-the-counter or prescription medicines for pain, discomfort, or fever as directed by your caregiver. Use cough suppressants only as directed by your caregiver. °· Use a cold steam vaporizer or humidifier in your bedroom or home to help loosen secretions. °· Sleep in a semi-upright position if your cough is worse at night. °· Rest as needed. °· Stop smoking if you smoke. °SEEK IMMEDIATE MEDICAL CARE IF:  °· You have pus in your sputum. °· Your cough starts to worsen. °· You cannot control your cough with suppressants and are losing sleep. °· You begin coughing up blood. °· You have difficulty breathing. °· You develop pain which is getting worse or is uncontrolled with medicine. °· You have a fever. °MAKE SURE YOU:  °· Understand these instructions. °· Will watch your condition. °· Will get help right away if you are not doing well or get worse. °Document Released: 02/14/2011 Document Revised: 11/10/2011 Document Reviewed: 02/14/2011 °ExitCare® Patient Information ©2015 [employer]. This information is not intended  to replace advice given to you by your health care provider. Make sure you discuss any questions you have with your health care provider. °Upper Respiratory Infection, Adult °An upper respiratory infection (URI) is also sometimes known as the common cold. The upper respiratory tract includes the nose, sinuses, throat, trachea, and bronchi. Bronchi are the airways leading to the lungs. Most people improve within 1 week, but symptoms can last up to 2 weeks. A residual cough may last even longer.  °CAUSES °Many different viruses can infect the tissues lining the upper respiratory tract. The tissues become irritated and inflamed and often become very moist. Mucus production is also common. A cold is contagious. You can easily spread the virus to others by oral contact. This includes kissing, sharing a glass, coughing, or sneezing. Touching your mouth or nose and then touching a surface, which is then touched by another person, can also spread the virus. °SYMPTOMS  °Symptoms typically develop 1 to 3 days after you come in contact with a cold virus. Symptoms vary from person to person. They may include: °· Runny nose. °· Sneezing. °· Nasal congestion. °· Sinus irritation. °· Sore throat. °· Loss of voice (laryngitis). °· Cough. °· Fatigue. °· Muscle aches. °· Loss of appetite. °· Headache. °· Low-grade fever. °DIAGNOSIS  °You might diagnose your own cold based on familiar symptoms, since most people get a cold 2 to 3 times a year. Your caregiver can confirm this based on your exam. Most importantly, your caregiver can check that your symptoms are not due to another disease such   as strep throat, sinusitis, pneumonia, asthma, or epiglottitis. Blood tests, throat tests, and X-rays are not necessary to diagnose a common cold, but they may sometimes be helpful in excluding other more serious diseases. Your caregiver will decide if any further tests are required. °RISKS AND COMPLICATIONS  °You may be at risk for a more severe  case of the common cold if you smoke cigarettes, have chronic heart disease (such as heart failure) or lung disease (such as asthma), or if you have a weakened immune system. The very young and very old are also at risk for more serious infections. Bacterial sinusitis, middle ear infections, and bacterial pneumonia can complicate the common cold. The common cold can worsen asthma and chronic obstructive pulmonary disease (COPD). Sometimes, these complications can require emergency medical care and may be life-threatening. °PREVENTION  °The best way to protect against getting a cold is to practice good hygiene. Avoid oral or hand contact with people with cold symptoms. Wash your hands often if contact occurs. There is no clear evidence that vitamin C, vitamin E, echinacea, or exercise reduces the chance of developing a cold. However, it is always recommended to get plenty of rest and practice good nutrition. °TREATMENT  °Treatment is directed at relieving symptoms. There is no cure. Antibiotics are not effective, because the infection is caused by a virus, not by bacteria. Treatment may include: °· Increased fluid intake. Sports drinks offer valuable electrolytes, sugars, and fluids. °· Breathing heated mist or steam (vaporizer or shower). °· Eating chicken soup or other clear broths, and maintaining good nutrition. °· Getting plenty of rest. °· Using gargles or lozenges for comfort. °· Controlling fevers with ibuprofen or acetaminophen as directed by your caregiver. °· Increasing usage of your inhaler if you have asthma. °Zinc gel and zinc lozenges, taken in the first 24 hours of the common cold, can shorten the duration and lessen the severity of symptoms. Pain medicines may help with fever, muscle aches, and throat pain. A variety of non-prescription medicines are available to treat congestion and runny nose. Your caregiver can make recommendations and may suggest nasal or lung inhalers for other symptoms.  °HOME  CARE INSTRUCTIONS  °· Only take over-the-counter or prescription medicines for pain, discomfort, or fever as directed by your caregiver. °· Use a warm mist humidifier or inhale steam from a shower to increase air moisture. This may keep secretions moist and make it easier to breathe. °· Drink enough water and fluids to keep your urine clear or pale yellow. °· Rest as needed. °· Return to work when your temperature has returned to normal or as your caregiver advises. You may need to stay home longer to avoid infecting others. You can also use a face mask and careful hand washing to prevent spread of the virus. °SEEK MEDICAL CARE IF:  °· After the first few days, you feel you are getting worse rather than better. °· You need your caregiver's advice about medicines to control symptoms. °· You develop chills, worsening shortness of breath, or brown or red sputum. These may be signs of pneumonia. °· You develop yellow or brown nasal discharge or pain in the face, especially when you bend forward. These may be signs of sinusitis. °· You develop a fever, swollen neck glands, pain with swallowing, or white areas in the back of your throat. These may be signs of strep throat. °SEEK IMMEDIATE MEDICAL CARE IF:  °· You have a fever. °· You develop severe or persistent headache, ear   pain, sinus pain, or chest pain. °· You develop wheezing, a prolonged cough, cough up blood, or have a change in your usual mucus (if you have chronic lung disease). °· You develop sore muscles or a stiff neck. °Document Released: 02/11/2001 Document Revised: 11/10/2011 Document Reviewed: 11/23/2013 °ExitCare® Patient Information ©2015 [employer]. This information is not intended to replace advice given to you by your health care provider. Make sure you discuss any questions you have with your health care provider. ° °

## 2014-09-06 ENCOUNTER — Emergency Department (INDEPENDENT_AMBULATORY_CARE_PROVIDER_SITE_OTHER)
Admission: EM | Admit: 2014-09-06 | Discharge: 2014-09-06 | Disposition: A | Discharge status: Home / Self Care | Payer: Self-pay | Source: Home / Self Care | Attending: Family Medicine | Admitting: Family Medicine

## 2014-09-06 ENCOUNTER — Encounter (HOSPITAL_COMMUNITY): Payer: Self-pay | Admitting: Emergency Medicine

## 2014-09-06 DIAGNOSIS — J40 Bronchitis, not specified as acute or chronic: Secondary | ICD-10-CM

## 2014-09-06 MED ORDER — ALBUTEROL SULFATE HFA 108 (90 BASE) MCG/ACT IN AERS: 2.0000 | INHALATION_SPRAY | Freq: Four times a day (QID) | RESPIRATORY_TRACT | Status: DC | PRN

## 2014-09-06 MED ORDER — IPRATROPIUM-ALBUTEROL 0.5-2.5 (3) MG/3ML IN SOLN
3.0000 mL | Freq: Once | RESPIRATORY_TRACT | Status: AC
  Administered 2014-09-06: 3 mL via RESPIRATORY_TRACT

## 2014-09-06 MED ORDER — IPRATROPIUM BROMIDE 0.06 % NA SOLN: 2.0000 | Freq: Four times a day (QID) | NASAL | Status: DC

## 2014-09-06 MED ORDER — GUAIFENESIN-CODEINE 100-10 MG/5ML PO SOLN: 5.0000 mL | Freq: Every evening | ORAL | Status: DC | PRN

## 2014-09-06 MED ORDER — IPRATROPIUM-ALBUTEROL 0.5-2.5 (3) MG/3ML IN SOLN
RESPIRATORY_TRACT | Status: AC
  Filled 2014-09-06: qty 3

## 2014-09-06 MED ORDER — PREDNISONE 10 MG PO TABS: 30.0000 mg | ORAL_TABLET | Freq: Every day | ORAL | Status: DC

## 2014-09-06 NOTE — ED Provider Notes (Signed)
Luis Beard is a 34 y.o. male who presents to Urgent Care today for cough congestion sore throat. No fevers or chills. Patient had some wheezing and mild shortness of breath. Cough is nonproductive. Patient also has body ache and headache. He had a few episodes of vomiting which have since resolved. He notes soreness in his chest with coughing and deep inspiration. No exertional chest pain nonradiating pain. He's tried Tylenol and Mucinex which have helped a little. He is also uses albuterol which helped some tube has run out.   Past Medical History  Diagnosis Date  . Asthma    History reviewed. No pertinent past surgical history. History  Substance Use Topics  . Smoking status: Current Every Day Smoker -- 1.00 packs/day    Types: Cigarettes  . Smokeless tobacco: Not on file  . Alcohol Use: Yes     Comment: occasionally   ROS as above Medications: No current facility-administered medications for this encounter.   Current Outpatient Prescriptions  Medication Sig Dispense Refill  . albuterol (PROVENTIL HFA;VENTOLIN HFA) 108 (90 BASE) MCG/ACT inhaler Inhale 2 puffs into the lungs every 6 (six) hours as needed for wheezing or shortness of breath. 1 Inhaler 2  . guaiFENesin-codeine 100-10 MG/5ML syrup Take 5 mLs by mouth at bedtime as needed for cough. 120 mL 0  . ipratropium (ATROVENT) 0.06 % nasal spray Place 2 sprays into both nostrils 4 (four) times daily. 15 mL 1  . predniSONE (DELTASONE) 10 MG tablet Take 3 tablets (30 mg total) by mouth daily. 15 tablet 0  . [DISCONTINUED] sodium chloride (OCEAN) 0.65 % SOLN nasal spray Place 1 spray into both nostrils as needed for congestion. 1 Bottle 0   No Known Allergies   Exam:  BP 154/101 mmHg  Pulse 83  Temp(Src) 97.8 F (36.6 C) (Oral)  Resp 20  SpO2 95% Gen: Well NAD HEENT: EOMI,  MMM posterior pharynx cobblestoning. Normal tympanic membranes bilaterally Lungs: Normal work of breathing. Slight wheezing bilaterally Heart: RRR  no MRG Abd: NABS, Soft. Nondistended, Nontender Exts: Brisk capillary refill, warm and well perfused.   Patient was given a 2.5/0.5 mg DuoNeb nebulizer treatment and felt a little better.  No results found for this or any previous visit (from the past 24 hour(s)). No results found.  Assessment and Plan: 34 y.o. male with bronchitis. Treatment with prednisone and Atrovent nasal spray albuterol and codeine cough medication. Work note provided.  Discussed warning signs or symptoms. Please see discharge instructions. Patient expresses understanding.     Rodolph BongEvan S Corey, MD 09/06/14 [phone]

## 2014-09-06 NOTE — Discharge Instructions (Signed)
Thank you for coming in today. °Call or go to the emergency room if you get worse, have trouble breathing, have chest pains, or palpitations.  ° °Acute Bronchitis °Bronchitis is inflammation of the airways that extend from the windpipe into the lungs (bronchi). The inflammation often causes mucus to develop. This leads to a cough, which is the most common symptom of bronchitis.  °In acute bronchitis, the condition usually develops suddenly and goes away over time, usually in a couple weeks. Smoking, allergies, and asthma can make bronchitis worse. Repeated episodes of bronchitis may cause further lung problems.  °CAUSES °Acute bronchitis is most often caused by the same virus that causes a cold. The virus can spread from person to person (contagious) through coughing, sneezing, and touching contaminated objects. °SIGNS AND SYMPTOMS  °· Cough.   °· Fever.   °· Coughing up mucus.   °· Body aches.   °· Chest congestion.   °· Chills.   °· Shortness of breath.   °· Sore throat.   °DIAGNOSIS  °Acute bronchitis is usually diagnosed through a physical exam. Your health care provider will also ask you questions about your medical history. Tests, such as chest X-rays, are sometimes done to rule out other conditions.  °TREATMENT  °Acute bronchitis usually goes away in a couple weeks. Oftentimes, no medical treatment is necessary. Medicines are sometimes given for relief of fever or cough. Antibiotic medicines are usually not needed but may be prescribed in certain situations. In some cases, an inhaler may be recommended to help reduce shortness of breath and control the cough. A cool mist vaporizer may also be used to help thin bronchial secretions and make it easier to clear the chest.  °HOME CARE INSTRUCTIONS °· Get plenty of rest.   °· Drink enough fluids to keep your urine clear or pale yellow (unless you have a medical condition that requires fluid restriction). Increasing fluids may help thin your respiratory secretions  (sputum) and reduce chest congestion, and it will prevent dehydration.   °· Take medicines only as directed by your health care provider. °· If you were prescribed an antibiotic medicine, finish it all even if you start to feel better. °· Avoid smoking and secondhand smoke. Exposure to cigarette smoke or irritating chemicals will make bronchitis worse. If you are a smoker, consider using nicotine gum or skin patches to help control withdrawal symptoms. Quitting smoking will help your lungs heal faster.   °· Reduce the chances of another bout of acute bronchitis by washing your hands frequently, avoiding people with cold symptoms, and trying not to touch your hands to your mouth, nose, or eyes.   °· Keep all follow-up visits as directed by your health care provider.   °SEEK MEDICAL CARE IF: °Your symptoms do not improve after 1 week of treatment.  °SEEK IMMEDIATE MEDICAL CARE IF: °· You develop an increased fever or chills.   °· You have chest pain.   °· You have severe shortness of breath. °· You have bloody sputum.   °· You develop dehydration. °· You faint or repeatedly feel like you are going to pass out. °· You develop repeated vomiting. °· You develop a severe headache. °MAKE SURE YOU:  °· Understand these instructions. °· Will watch your condition. °· Will get help right away if you are not doing well or get worse. °Document Released: 09/25/2004 Document Revised: 01/02/2014 Document Reviewed: 02/08/2013 °[employer]® Patient Information ©2015 [employer]. This information is not intended to replace advice given to you by your health care provider. Make sure you discuss any questions you have with your   health care provider. ° °

## 2014-09-06 NOTE — ED Notes (Signed)
C/o cold sx onset 5-6 days Sx include: cough, BA, HA, v/d, bilateral rib pain due to cough Alert, no signs of acute distress.

## 2014-09-12 ENCOUNTER — Emergency Department (HOSPITAL_COMMUNITY)
Admission: EM | Admit: 2014-09-12 | Discharge: 2014-09-12 | Disposition: A | Discharge status: Home / Self Care | Payer: Self-pay | Attending: Emergency Medicine | Admitting: Emergency Medicine

## 2014-09-12 ENCOUNTER — Emergency Department (HOSPITAL_COMMUNITY): Payer: Self-pay

## 2014-09-12 ENCOUNTER — Encounter (HOSPITAL_COMMUNITY): Payer: Self-pay | Admitting: *Deleted

## 2014-09-12 DIAGNOSIS — Z720 Tobacco use: Secondary | ICD-10-CM | POA: Insufficient documentation

## 2014-09-12 DIAGNOSIS — R05 Cough: Secondary | ICD-10-CM

## 2014-09-12 DIAGNOSIS — R059 Cough, unspecified: Secondary | ICD-10-CM

## 2014-09-12 DIAGNOSIS — J45909 Unspecified asthma, uncomplicated: Secondary | ICD-10-CM | POA: Insufficient documentation

## 2014-09-12 DIAGNOSIS — R112 Nausea with vomiting, unspecified: Secondary | ICD-10-CM

## 2014-09-12 DIAGNOSIS — B349 Viral infection, unspecified: Secondary | ICD-10-CM | POA: Insufficient documentation

## 2014-09-12 DIAGNOSIS — R109 Unspecified abdominal pain: Secondary | ICD-10-CM

## 2014-09-12 DIAGNOSIS — Z79899 Other long term (current) drug therapy: Secondary | ICD-10-CM | POA: Insufficient documentation

## 2014-09-12 DIAGNOSIS — R1013 Epigastric pain: Secondary | ICD-10-CM | POA: Insufficient documentation

## 2014-09-12 LAB — CBC WITH DIFFERENTIAL/PLATELET
Basophils Absolute: 0 10*3/uL (ref 0.0–0.1)
Basophils Relative: 0 % (ref 0–1)
Eosinophils Absolute: 0.2 10*3/uL (ref 0.0–0.7)
Eosinophils Relative: 3 % (ref 0–5)
HCT: 48.7 % (ref 39.0–52.0)
HEMOGLOBIN: 16.1 g/dL (ref 13.0–17.0)
LYMPHS PCT: 17 % (ref 12–46)
Lymphs Abs: 1.3 10*3/uL (ref 0.7–4.0)
MCH: 27.2 pg (ref 26.0–34.0)
MCHC: 33.1 g/dL (ref 30.0–36.0)
MCV: 82.3 fL (ref 78.0–100.0)
MONOS PCT: 9 % (ref 3–12)
Monocytes Absolute: 0.7 10*3/uL (ref 0.1–1.0)
NEUTROS ABS: 5.3 10*3/uL (ref 1.7–7.7)
Neutrophils Relative %: 71 % (ref 43–77)
Platelets: 307 10*3/uL (ref 150–400)
RBC: 5.92 MIL/uL — AB (ref 4.22–5.81)
RDW: 13.5 % (ref 11.5–15.5)
WBC: 7.6 10*3/uL (ref 4.0–10.5)

## 2014-09-12 LAB — URINALYSIS, ROUTINE W REFLEX MICROSCOPIC
BILIRUBIN URINE: NEGATIVE
Glucose, UA: NEGATIVE mg/dL
Hgb urine dipstick: NEGATIVE
Ketones, ur: NEGATIVE mg/dL
Leukocytes, UA: NEGATIVE
Nitrite: NEGATIVE
PH: 7.5 (ref 5.0–8.0)
Protein, ur: NEGATIVE mg/dL
SPECIFIC GRAVITY, URINE: 1.034 — AB (ref 1.005–1.030)
Urobilinogen, UA: 1 mg/dL (ref 0.0–1.0)

## 2014-09-12 LAB — COMPREHENSIVE METABOLIC PANEL
ALK PHOS: 93 U/L (ref 39–117)
ALT: 42 U/L (ref 0–53)
AST: 28 U/L (ref 0–37)
Albumin: 3.9 g/dL (ref 3.5–5.2)
Anion gap: 11 (ref 5–15)
BILIRUBIN TOTAL: 1.4 mg/dL — AB (ref 0.3–1.2)
BUN: 11 mg/dL (ref 6–23)
CALCIUM: 9 mg/dL (ref 8.4–10.5)
CO2: 24 mmol/L (ref 19–32)
Chloride: 102 mEq/L (ref 96–112)
Creatinine, Ser: 1.14 mg/dL (ref 0.50–1.35)
GFR calc Af Amer: >90 mL/min (ref >90)
GFR, EST NON AFRICAN AMERICAN: 84 mL/min — AB (ref >90)
Glucose, Bld: 124 mg/dL — ABNORMAL HIGH (ref 70–99)
Potassium: 4 mmol/L (ref 3.5–5.1)
SODIUM: 137 mmol/L (ref 135–145)
Total Protein: 7.3 g/dL (ref 6.0–8.3)

## 2014-09-12 LAB — LIPASE, BLOOD: LIPASE: 31 U/L (ref 11–59)

## 2014-09-12 MED ORDER — IOHEXOL 300 MG/ML  SOLN
25.0000 mL | INTRAMUSCULAR | Status: AC
  Administered 2014-09-12: 25 mL via ORAL

## 2014-09-12 MED ORDER — MORPHINE SULFATE 4 MG/ML IJ SOLN
4.0000 mg | Freq: Once | INTRAMUSCULAR | Status: AC
  Administered 2014-09-12: 4 mg via INTRAVENOUS
  Filled 2014-09-12: qty 1

## 2014-09-12 MED ORDER — IOHEXOL 300 MG/ML  SOLN
100.0000 mL | Freq: Once | INTRAMUSCULAR | Status: AC | PRN
  Administered 2014-09-12: 100 mL via INTRAVENOUS

## 2014-09-12 MED ORDER — SODIUM CHLORIDE 0.9 % IV BOLUS (SEPSIS)
1000.0000 mL | Freq: Once | INTRAVENOUS | Status: AC
  Administered 2014-09-12: 1000 mL via INTRAVENOUS

## 2014-09-12 MED ORDER — ONDANSETRON HCL 4 MG/2ML IJ SOLN
4.0000 mg | Freq: Once | INTRAMUSCULAR | Status: AC
  Administered 2014-09-12: 4 mg via INTRAVENOUS
  Filled 2014-09-12: qty 2

## 2014-09-12 MED ORDER — ONDANSETRON 4 MG PO TBDP: 4.0000 mg | ORAL_TABLET | Freq: Three times a day (TID) | ORAL | Status: DC | PRN

## 2014-09-12 MED ORDER — BENZONATATE 100 MG PO CAPS: 100.0000 mg | ORAL_CAPSULE | Freq: Three times a day (TID) | ORAL | Status: DC | PRN

## 2014-09-12 NOTE — ED Provider Notes (Signed)
CSN: [id number]     Arrival date & time 09/12/14  1236 History   First MD Initiated Contact with Patient 09/12/14 1442     Chief Complaint  Patient presents with  . Abdominal Pain  . Emesis  . Nasal Congestion  . Generalized Body Aches     (Consider location/radiation/quality/duration/timing/severity/associated sxs/prior Treatment) Patient is a 34 y.o. male presenting with abdominal pain and vomiting.  Abdominal Pain Pain location:  Epigastric Pain quality: cramping and sharp   Pain radiates to:  Back Pain severity:  Severe Onset quality:  Gradual Duration:  1 day Timing:  Constant Progression:  Worsening Chronicity:  New Context: recent illness (URI, started on prednisone)   Relieved by:  Nothing Worsened by:  Nothing tried Ineffective treatments:  None tried Associated symptoms: anorexia, chills, cough, fatigue, nausea and vomiting   Associated symptoms: no chest pain, no constipation, no diarrhea (had at beginning of illness (1/1 however has resolved)), no dysuria, no fever, no shortness of breath and no sore throat   Cough:    Cough characteristics:  Non-productive Emesis Associated symptoms: abdominal pain and chills   Associated symptoms: no diarrhea (had at beginning of illness (1/1 however has resolved)), no headaches and no sore throat     Past Medical History  Diagnosis Date  . Asthma    History reviewed. No pertinent past surgical history. History reviewed. No pertinent family history. History  Substance Use Topics  . Smoking status: Current Every Day Smoker -- 1.00 packs/day    Types: Cigarettes  . Smokeless tobacco: Not on file  . Alcohol Use: Yes     Comment: occasionally    Review of Systems  Constitutional: Positive for chills and fatigue. Negative for fever.  HENT: Positive for congestion. Negative for sore throat.   Eyes: Negative for visual disturbance.  Respiratory: Positive for cough. Negative for shortness of breath.   Cardiovascular:  Negative for chest pain.  Gastrointestinal: Positive for nausea, vomiting, abdominal pain and anorexia. Negative for diarrhea (had at beginning of illness (1/1 however has resolved)) and constipation.  Genitourinary: Negative for dysuria and difficulty urinating.  Musculoskeletal: Negative for back pain and neck stiffness.  Skin: Negative for rash.  Neurological: Negative for syncope and headaches.      Allergies  Review of patient's allergies indicates no known allergies.  Home Medications   Prior to Admission medications   Medication Sig Start Date End Date Taking? Authorizing Provider  albuterol (PROVENTIL HFA;VENTOLIN HFA) 108 (90 BASE) MCG/ACT inhaler Inhale 2 puffs into the lungs every 6 (six) hours as needed for wheezing or shortness of breath. 09/06/14  Yes Rodolph Bong, MD  guaiFENesin-codeine 100-10 MG/5ML syrup Take 5 mLs by mouth at bedtime as needed for cough. 09/06/14  Yes Rodolph Bong, MD  ipratropium (ATROVENT) 0.06 % nasal spray Place 2 sprays into both nostrils 4 (four) times daily. 09/06/14  Yes Rodolph Bong, MD  predniSONE (DELTASONE) 10 MG tablet Take 3 tablets (30 mg total) by mouth daily. Patient not taking: Reported on 09/12/2014 09/06/14   Rodolph Bong, MD   BP 125/59 mmHg  Pulse 66  Temp(Src) 98.8 F (37.1 C) (Oral)  Resp 18  SpO2 97% Physical Exam  Constitutional: He is oriented to person, place, and time. He appears well-developed and well-nourished. No distress.  HENT:  Head: Normocephalic and atraumatic.  Eyes: Conjunctivae and EOM are normal.  Neck: Normal range of motion.  Cardiovascular: Normal rate, regular rhythm, normal heart sounds and intact distal  pulses.  Exam reveals no gallop and no friction rub.   No murmur heard. Pulmonary/Chest: Effort normal and breath sounds normal. No respiratory distress. He has no wheezes. He has no rales.  Abdominal: Soft. He exhibits no distension. There is tenderness in the right lower quadrant. There is tenderness at  McBurney's point and positive Murphy's sign. There is no rebound and no guarding.  Musculoskeletal: He exhibits no edema.  Neurological: He is alert and oriented to person, place, and time.  Skin: Skin is warm and dry. He is not diaphoretic.  Nursing note and vitals reviewed.   ED Course  Procedures (including critical care time) Labs Review Labs Reviewed  COMPREHENSIVE METABOLIC PANEL - Abnormal; Notable for the following:    Glucose, Bld 124 (*)    Total Bilirubin 1.4 (*)    GFR calc non Af Amer 84 (*)    All other components within normal limits  CBC WITH DIFFERENTIAL - Abnormal; Notable for the following:    RBC 5.92 (*)    All other components within normal limits  LIPASE, BLOOD  URINALYSIS, ROUTINE W REFLEX MICROSCOPIC    Imaging Review No results found.   EKG Interpretation None      MDM   Final diagnoses:  Cough   34 year old male withmedical history presents with concern of nausea, vomiting and abdominal pain. Patient also reports continuing URI symptoms since January 1. He was seen at an urgent care on the seventh and was given prednisone.  Patient with RLQ tenderness on exam and given abdominal pain had CT abd/pelvis which showed no sign of appendicitis or other intraabdominal abnormality. Normal lipase/transaminases.  CXR shows no sign of pneumonia. Persisting sinus congestion/cough/nausea and /vomiting abdominal pain likely viral syndrome.   Given rx for zofran and tessalon for cough.  Patient discharged in stable condition with understanding of reasons to return.    Alvira MondayErin Konstantinos Cordoba, MD 09/13/14 1158  Audree CamelScott T Goldston, MD 09/15/14 2109

## 2014-09-12 NOTE — ED Notes (Signed)
Epigastric pain radiating to back; 3  episode of emesis.

## 2014-09-12 NOTE — ED Notes (Signed)
Generalized body aches, congestion, emesis, abd. Pain x 2 weeks; worse in las 24 hrs.

## 2014-09-12 NOTE — ED Notes (Signed)
Pt ambulatory to restroom with steady gait. Dr.Goldston aware pts results have came back

## 2015-01-07 ENCOUNTER — Encounter (HOSPITAL_COMMUNITY): Payer: Self-pay | Admitting: Emergency Medicine

## 2015-01-07 ENCOUNTER — Emergency Department (HOSPITAL_COMMUNITY): Payer: Medicaid Other

## 2015-01-07 ENCOUNTER — Emergency Department (HOSPITAL_COMMUNITY)
Admission: EM | Admit: 2015-01-07 | Discharge: 2015-01-07 | Disposition: A | Discharge status: Home / Self Care | Payer: Self-pay | Attending: Emergency Medicine | Admitting: Emergency Medicine

## 2015-01-07 DIAGNOSIS — J45909 Unspecified asthma, uncomplicated: Secondary | ICD-10-CM | POA: Insufficient documentation

## 2015-01-07 DIAGNOSIS — M940 Chondrocostal junction syndrome [Tietze]: Secondary | ICD-10-CM | POA: Insufficient documentation

## 2015-01-07 DIAGNOSIS — F419 Anxiety disorder, unspecified: Secondary | ICD-10-CM | POA: Insufficient documentation

## 2015-01-07 DIAGNOSIS — Z79899 Other long term (current) drug therapy: Secondary | ICD-10-CM | POA: Insufficient documentation

## 2015-01-07 DIAGNOSIS — Z720 Tobacco use: Secondary | ICD-10-CM | POA: Insufficient documentation

## 2015-01-07 DIAGNOSIS — R0789 Other chest pain: Secondary | ICD-10-CM | POA: Insufficient documentation

## 2015-01-07 MED ORDER — IBUPROFEN 200 MG PO TABS
400.0000 mg | ORAL_TABLET | Freq: Once | ORAL | Status: AC
  Administered 2015-01-07: 400 mg via ORAL
  Filled 2015-01-07: qty 2

## 2015-01-07 MED ORDER — NAPROXEN 500 MG PO TABS: 500.0000 mg | ORAL_TABLET | Freq: Two times a day (BID) | ORAL | Status: DC

## 2015-01-07 NOTE — ED Provider Notes (Signed)
CSN: [id number]     Arrival date & time 01/07/15  1859 History   First MD Initiated Contact with Patient 01/07/15 1912     Chief Complaint  Patient presents with  . Chest Pain   Dyer Klug is a 34 y.o. male with a history of asthma who presents to the emergency department complaining of right chest wall pain ongoing for the past 4 days. He reports his pain is constant and is worse with movement or use of his arms. He complains of 7 out of 10 pain describes it as an ache. He reports some increasing amount of coughing but his cough is nonproductive. He denies current shortness of breath. Patient reports he is a smoker and smokes about 2 cigars a day. He also admits to intermittent marijuana use. He reports feeling like his breathing is fine. He has not taken anything for treatment of his pain. The patient denies personal or family history of DVTs or PEs. The patient denies personal or family history of blood clotting disorders such as factor V Leiden, protein C or S deficiency. The patient denies recent long travel, recent surgery or hemoptysis. The patient denies fevers, chills, palpitations, hemoptysis, abdominal pain, nausea, vomiting, numbness, tingling, weakness, leg pain, leg swelling, or rashes. Patient denies personal or close family history of MI.  (Consider location/radiation/quality/duration/timing/severity/associated sxs/prior Treatment) HPI  Past Medical History  Diagnosis Date  . Asthma    No past surgical history on file. No family history on file. History  Substance Use Topics  . Smoking status: Current Every Day Smoker -- 1.00 packs/day    Types: Cigarettes  . Smokeless tobacco: Not on file  . Alcohol Use: Yes     Comment: occasionally    Review of Systems  Constitutional: Negative for fever and chills.  HENT: Negative for congestion, ear pain and sore throat.   Eyes: Negative for pain and visual disturbance.  Respiratory: Positive for cough. Negative for chest  tightness, shortness of breath and wheezing.   Cardiovascular: Positive for chest pain. Negative for palpitations and leg swelling.  Gastrointestinal: Negative for nausea, vomiting, abdominal pain and diarrhea.  Genitourinary: Negative for dysuria and hematuria.  Musculoskeletal: Negative for back pain and neck pain.  Skin: Negative for rash.  Neurological: Negative for syncope, weakness, light-headedness and headaches.      Allergies  Review of patient's allergies indicates no known allergies.  Home Medications   Prior to Admission medications   Medication Sig Start Date End Date Taking? Authorizing Provider  diphenhydrAMINE (SOMINEX) 25 MG tablet Take 50 mg by mouth at bedtime as needed for allergies.   Yes Historical Provider, MD  albuterol (PROVENTIL HFA;VENTOLIN HFA) 108 (90 BASE) MCG/ACT inhaler Inhale 2 puffs into the lungs every 6 (six) hours as needed for wheezing or shortness of breath. Patient not taking: Reported on 01/07/2015 09/06/14   Rodolph Bong, MD  benzonatate (TESSALON) 100 MG capsule Take 1 capsule (100 mg total) by mouth 3 (three) times daily as needed for cough. Patient not taking: Reported on 01/07/2015 09/12/14   Alvira Monday, MD  guaiFENesin-codeine 100-10 MG/5ML syrup Take 5 mLs by mouth at bedtime as needed for cough. Patient not taking: Reported on 01/07/2015 09/06/14   Rodolph Bong, MD  ipratropium (ATROVENT) 0.06 % nasal spray Place 2 sprays into both nostrils 4 (four) times daily. Patient not taking: Reported on 01/07/2015 09/06/14   Rodolph Bong, MD  naproxen (NAPROSYN) 500 MG tablet Take 1 tablet (500 mg total) by  mouth 2 (two) times daily with a meal. 01/07/15   Everlene FarrierWilliam Kaamil Morefield, PA-C  ondansetron (ZOFRAN ODT) 4 MG disintegrating tablet Take 1 tablet (4 mg total) by mouth every 8 (eight) hours as needed for nausea or vomiting. Patient not taking: Reported on 01/07/2015 09/12/14   Alvira MondayErin Schlossman, MD  predniSONE (DELTASONE) 10 MG tablet Take 3 tablets (30 mg total) by  mouth daily. Patient not taking: Reported on 09/12/2014 09/06/14   Rodolph BongEvan S Corey, MD   BP 147/87 mmHg  Pulse 81  Temp(Src) 98.1 F (36.7 C) (Oral)  Resp 18  SpO2 97% Physical Exam  Constitutional: He is oriented to person, place, and time. He appears well-developed and well-nourished. No distress.  Nontoxic appearing. Overweight male.   HENT:  Head: Normocephalic and atraumatic.  Right Ear: External ear normal.  Left Ear: External ear normal.  Mouth/Throat: Oropharynx is clear and moist. No oropharyngeal exudate.  Eyes: Conjunctivae are normal. Pupils are equal, round, and reactive to light. Right eye exhibits no discharge. Left eye exhibits no discharge.  Neck: Neck supple. No JVD present. No tracheal deviation present.  Cardiovascular: Normal rate, regular rhythm, normal heart sounds and intact distal pulses.  Exam reveals no gallop and no friction rub.   No murmur heard. Bilateral radial, posterior tibialis and dorsalis pedis pulses are intact.    Pulmonary/Chest: Effort normal and breath sounds normal. No respiratory distress. He has no wheezes. He has no rales. He exhibits tenderness.  Lungs are clear to auscultation bilaterally. The patient has pinpoint right chest wall tenderness that reproduces his chest pain. No rashes or deformity to his chest.  Abdominal: Soft. He exhibits no distension. There is no tenderness.  Musculoskeletal: He exhibits no edema or tenderness.  No lower extremity edema or tenderness.  Lymphadenopathy:    He has no cervical adenopathy.  Neurological: He is alert and oriented to person, place, and time. Coordination normal.  Skin: Skin is warm and dry. No rash noted. He is not diaphoretic. No erythema. No pallor.  Psychiatric: His behavior is normal. His mood appears anxious.  Appears anxious. Reports feeling anxious about being in the hospital.   Nursing note and vitals reviewed.   ED Course  Procedures (including critical care time) Labs Review Labs  Reviewed - No data to display  Imaging Review Dg Chest 2 View  01/07/2015   CLINICAL DATA:  Chest pain radiating to the right lateral chest. Cough.  EXAM: CHEST  2 VIEW  COMPARISON:  09/12/2014  FINDINGS: Low lung volumes are present, causing crowding of the pulmonary vasculature.  The lungs appear clear.  Cardiac and mediastinal contours normal.  No pleural effusion identified.  IMPRESSION: No active cardiopulmonary disease.   Electronically Signed   By: Gaylyn RongWalter  Liebkemann M.D.   On: 01/07/2015 20:59     EKG Interpretation   Date/Time:  Sunday Jan 07 2015 19:05:23 EDT Ventricular Rate:  105 PR Interval:  128 QRS Duration: 82 QT Interval:  327 QTC Calculation: 432 R Axis:   48 Text Interpretation:  Sinus tachycardia Baseline wander in lead(s) I III  aVL No significant change since last tracing Confirmed by Ethelda ChickJACUBOWITZ  MD,  SAM [phone]([id number]) on 01/07/2015 7:44:44 PM      MDM   Meds given in ED:  Medications  ibuprofen (ADVIL,MOTRIN) tablet 400 mg (400 mg Oral Given 01/07/15 2001)    New Prescriptions   NAPROXEN (NAPROSYN) 500 MG TABLET    Take 1 tablet (500 mg total) by mouth 2 (two) times  daily with a meal.    Final diagnoses:  Chest wall pain  Costochondritis    This is a 34 y.o. male complaining of pinpoint right-sided chest wall pain ongoing for the past 4 days. This pain is constant and worse with cough or movement. His pain is better with staying still. He denies any shortness of breath. On exam patient is afebrile and nontoxic appearing. He appears slightly anxious. He reports feeling anxious about being in the emergency department. His chest pain is reproducible on palpation on the right side of his chest. His pain worsens or lifting his arm. His lungs are clear to auscultation. No murmurs on heart exam. Patient's EKG is unremarkable. His chest x-ray is also unremarkable. At reevaluation the patient reports feeling better and feeling less anxious. His heart rate is 75 and his  oxygen saturation is 96% on room air. His blood pressure has improved to 135/60. Patient exam and history consistent with costochondritis chest wall pain. See no need for blood work at this time. Dr. Ethelda ChickJacubowitz agrees. Will provide the patient a prescription for naproxen. Strict return precautions given. I advised the patient to follow-up with their primary care provider this week. I advised the patient to return to the emergency department with new or worsening symptoms or new concerns. The patient verbalized understanding and agreement with plan.    This patient was discussed with and evaluated by Dr. Ethelda ChickJacubowitz who agrees with assessment and plan.     Everlene FarrierWilliam Enda Santo, PA-C 01/07/15 2130  Doug SouSam Jacubowitz, MD 01/07/15 [phone]

## 2015-01-07 NOTE — Discharge Instructions (Signed)
Chest Wall Pain °Chest wall pain is pain in or around the bones and muscles of your chest. It may take up to 6 weeks to get better. It may take longer if you must stay physically active in your work and activities.  °CAUSES  °Chest wall pain may happen on its own. However, it may be caused by: °· A viral illness like the flu. °· Injury. °· Coughing. °· Exercise. °· Arthritis. °· Fibromyalgia. °· Shingles. °HOME CARE INSTRUCTIONS  °· Avoid overtiring physical activity. Try not to strain or perform activities that cause pain. This includes any activities using your chest or your abdominal and side muscles, especially if heavy weights are used. °· Put ice on the sore area. °¨ Put ice in a plastic bag. °¨ Place a towel between your skin and the bag. °¨ Leave the ice on for 15-20 minutes per hour while awake for the first 2 days. °· Only take over-the-counter or prescription medicines for pain, discomfort, or fever as directed by your caregiver. °SEEK IMMEDIATE MEDICAL CARE IF:  °· Your pain increases, or you are very uncomfortable. °· You have a fever. °· Your chest pain becomes worse. °· You have new, unexplained symptoms. °· You have nausea or vomiting. °· You feel sweaty or lightheaded. °· You have a cough with phlegm (sputum), or you cough up blood. °MAKE SURE YOU:  °· Understand these instructions. °· Will watch your condition. °· Will get help right away if you are not doing well or get worse. °Document Released: 08/18/2005 Document Revised: 11/10/2011 Document Reviewed: 04/14/2011 °[employer]® Patient Information ©2015 [employer]. This information is not intended to replace advice given to you by your health care provider. Make sure you discuss any questions you have with your health care provider. ° °Costochondritis °Costochondritis, sometimes called Tietze syndrome, is a swelling and irritation (inflammation) of the tissue (cartilage) that connects your ribs with your breastbone (sternum). It causes pain in  the chest and rib area. Costochondritis usually goes away on its own over time. It can take up to 6 weeks or longer to get better, especially if you are unable to limit your activities. °CAUSES  °Some cases of costochondritis have no known cause. Possible causes include: °· Injury (trauma). °· Exercise or activity such as lifting. °· Severe coughing. °SIGNS AND SYMPTOMS °· Pain and tenderness in the chest and rib area. °· Pain that gets worse when coughing or taking deep breaths. °· Pain that gets worse with specific movements. °DIAGNOSIS  °Your health care provider will do a physical exam and ask about your symptoms. Chest X-rays or other tests may be done to rule out other problems. °TREATMENT  °Costochondritis usually goes away on its own over time. Your health care provider may prescribe medicine to help relieve pain. °HOME CARE INSTRUCTIONS  °· Avoid exhausting physical activity. Try not to strain your ribs during normal activity. This would include any activities using chest, abdominal, and side muscles, especially if heavy weights are used. °· Apply ice to the affected area for the first 2 days after the pain begins. °¨ Put ice in a plastic bag. °¨ Place a towel between your skin and the bag. °¨ Leave the ice on for 20 minutes, 2-3 times a day. °· Only take over-the-counter or prescription medicines as directed by your health care provider. °SEEK MEDICAL CARE IF: °· You have redness or swelling at the rib joints. These are signs of infection. °· Your pain does not go away despite rest   or medicine. °SEEK IMMEDIATE MEDICAL CARE IF:  °· Your pain increases or you are very uncomfortable. °· You have shortness of breath or difficulty breathing. °· You cough up blood. °· You have worse chest pains, sweating, or vomiting. °· You have a fever or persistent symptoms for more than 2-3 days. °· You have a fever and your symptoms suddenly get worse. °MAKE SURE YOU:  °· Understand these instructions. °· Will watch your  condition. °· Will get help right away if you are not doing well or get worse. °Document Released: 05/28/2005 Document Revised: 06/08/2013 Document Reviewed: 03/22/2013 °ExitCare® Patient Information ©2015 [employer]. This information is not intended to replace advice given to you by your health care provider. Make sure you discuss any questions you have with your health care provider. ° °

## 2015-01-07 NOTE — ED Provider Notes (Signed)
Complains of right-sided parasternal chest pain onset 5 days ago. Pain covers a 1 cm diameter area Constant worse with cough or changing positions. Improved with remaining still No shortness of breath cough is nonproductive. No fever. No treatment prior to coming here. On exam alert no distress lungs clear auscultation heart regular rate and rhythm abdomen nondistended nontender. Chest is exquisitely tender at right parasternal area here pain is exacerbated by forcible flexion of right shoulder. Exam and history is consistent with chest wall pain  Luis SouSam Brixon Zhen, MD 01/07/15 2040

## 2015-01-07 NOTE — ED Notes (Signed)
Pt c/o central CP x 2 days.  States it hurts worse when he lays down and when he coughs.  Denies having a hx of more coughing recently.  Denies NVD.

## 2015-09-18 ENCOUNTER — Encounter (HOSPITAL_COMMUNITY): Payer: Self-pay | Admitting: *Deleted

## 2015-09-18 ENCOUNTER — Emergency Department (INDEPENDENT_AMBULATORY_CARE_PROVIDER_SITE_OTHER)
Admission: EM | Admit: 2015-09-18 | Discharge: 2015-09-18 | Disposition: A | Discharge status: Home / Self Care | Payer: Self-pay | Source: Home / Self Care | Attending: Emergency Medicine | Admitting: Emergency Medicine

## 2015-09-18 DIAGNOSIS — J020 Streptococcal pharyngitis: Secondary | ICD-10-CM

## 2015-09-18 LAB — POCT RAPID STREP A: Streptococcus, Group A Screen (Direct): POSITIVE — AB

## 2015-09-18 MED ORDER — AMOXICILLIN 500 MG PO CAPS: 500.0000 mg | ORAL_CAPSULE | Freq: Two times a day (BID) | ORAL | Status: DC

## 2015-09-18 MED ORDER — HYDROCODONE-HOMATROPINE 5-1.5 MG/5ML PO SYRP: 5.0000 mL | ORAL_SOLUTION | Freq: Four times a day (QID) | ORAL | Status: DC | PRN

## 2015-09-18 NOTE — ED Provider Notes (Signed)
CSN: [id number]     Arrival date & time 09/18/15  1303 History   First MD Initiated Contact with Patient 09/18/15 1341     Chief Complaint  Patient presents with  . URI   (Consider location/radiation/quality/duration/timing/severity/associated sxs/prior Treatment) HPI  He is a 35 year old man here for evaluation of sore throat. He states his symptoms started 2 days ago with nasal congestion, rhinorrhea, sneezing, sore throat, and cough. He also reports body aches. No known fever. Symptoms worsened last night, particularly the sore throat. He also reports a change in his voice. He is able to tolerate his secretions and liquids. No nausea or vomiting. No shortness of breath. He does report intermittent wheezing.  Past Medical History  Diagnosis Date  . Asthma    History reviewed. No pertinent past surgical history. History reviewed. No pertinent family history. Social History  Substance Use Topics  . Smoking status: Current Every Day Smoker -- 1.00 packs/day    Types: Cigarettes  . Smokeless tobacco: None  . Alcohol Use: Yes     Comment: occasionally    Review of Systems As in history of present illness Allergies  Review of patient's allergies indicates no known allergies.  Home Medications   Prior to Admission medications   Medication Sig Start Date End Date Taking? Authorizing Provider  albuterol (PROVENTIL HFA;VENTOLIN HFA) 108 (90 BASE) MCG/ACT inhaler Inhale 2 puffs into the lungs every 6 (six) hours as needed for wheezing or shortness of breath. Patient not taking: Reported on 01/07/2015 09/06/14   Rodolph Bong, MD  amoxicillin (AMOXIL) 500 MG capsule Take 1 capsule (500 mg total) by mouth 2 (two) times daily. 09/18/15   Charm Rings, MD  diphenhydrAMINE (SOMINEX) 25 MG tablet Take 50 mg by mouth at bedtime as needed for allergies.    Historical Provider, MD  guaiFENesin-codeine 100-10 MG/5ML syrup Take 5 mLs by mouth at bedtime as needed for cough. Patient not taking: Reported  on 01/07/2015 09/06/14   Rodolph Bong, MD  HYDROcodone-homatropine [facility]) 5-1.5 MG/5ML syrup Take 5 mLs by mouth every 6 (six) hours as needed for cough. 09/18/15   Charm Rings, MD  naproxen (NAPROSYN) 500 MG tablet Take 1 tablet (500 mg total) by mouth 2 (two) times daily with a meal. 01/07/15   Everlene Farrier, PA-C   Meds Ordered and Administered this Visit  Medications - No data to display  BP 192/86 mmHg  Pulse 109  Temp(Src) 98.2 F (36.8 C) (Oral)  Resp 28  SpO2 98% No data found.  repeat manual blood pressure 160/90  Physical Exam  Constitutional: He is oriented to person, place, and time. He appears well-developed and well-nourished. No distress.  HENT:  Mouth/Throat: Oropharyngeal exudate present.  Oropharynx is quite erythematous. No focal abscess identified. He has copious nasal discharge. Nasal mucosa is edematous and somewhat erythematous. TMs normal bilaterally.  Neck: Neck supple.  Cardiovascular: Normal rate, regular rhythm and normal heart sounds.   No murmur heard. Pulmonary/Chest: Effort normal and breath sounds normal. No respiratory distress. He has no wheezes. He has no rales.  Lymphadenopathy:    He has no cervical adenopathy.  Neurological: He is alert and oriented to person, place, and time.    ED Course  Procedures (including critical care time)  Labs Review Labs Reviewed  POCT RAPID STREP A - Abnormal; Notable for the following:    Streptococcus, Group A Screen (Direct) POSITIVE (*)    All other components within normal limits  Imaging Review No results found.    MDM   1. Strep pharyngitis    He likely also has a viral illness given the nasal congestion and cough. We'll treat with amoxicillin for strep throat. Hycodan as needed for cough. Return precautions reviewed.    Charm Rings, MD 09/18/15 1416

## 2015-09-18 NOTE — ED Notes (Signed)
Pt     Reports      Symptoms  Of  Cough       Congested    And       sorethroat    With   Symptoms         For  Several  Days          Pt   Reports  Pain   When  He  Swallows  As   Well

## 2015-09-18 NOTE — Discharge Instructions (Signed)
You have strep throat. Take amoxicillin twice a day for 10 days. You may also have a virus on top of this. Use over-the-counter nasal saline spray to help with the congestion. Take Hycodan every 4-6 hours as needed for cough. This will also help with the sore throat. Do not drive while taking this medicine. You should see improvement over the next 2 days. Follow-up as needed.

## 2015-10-05 ENCOUNTER — Emergency Department (HOSPITAL_COMMUNITY): Payer: Medicaid Other

## 2015-10-05 ENCOUNTER — Encounter (HOSPITAL_COMMUNITY): Payer: Self-pay

## 2015-10-05 ENCOUNTER — Emergency Department (HOSPITAL_COMMUNITY)
Admission: EM | Admit: 2015-10-05 | Discharge: 2015-10-05 | Disposition: A | Discharge status: Home / Self Care | Payer: Medicaid Other | Attending: Emergency Medicine | Admitting: Emergency Medicine

## 2015-10-05 DIAGNOSIS — Z792 Long term (current) use of antibiotics: Secondary | ICD-10-CM | POA: Insufficient documentation

## 2015-10-05 DIAGNOSIS — J069 Acute upper respiratory infection, unspecified: Secondary | ICD-10-CM | POA: Insufficient documentation

## 2015-10-05 DIAGNOSIS — Z791 Long term (current) use of non-steroidal anti-inflammatories (NSAID): Secondary | ICD-10-CM | POA: Insufficient documentation

## 2015-10-05 DIAGNOSIS — M542 Cervicalgia: Secondary | ICD-10-CM | POA: Insufficient documentation

## 2015-10-05 DIAGNOSIS — J45909 Unspecified asthma, uncomplicated: Secondary | ICD-10-CM | POA: Insufficient documentation

## 2015-10-05 DIAGNOSIS — G478 Other sleep disorders: Secondary | ICD-10-CM | POA: Insufficient documentation

## 2015-10-05 DIAGNOSIS — F17210 Nicotine dependence, cigarettes, uncomplicated: Secondary | ICD-10-CM | POA: Insufficient documentation

## 2015-10-05 DIAGNOSIS — J029 Acute pharyngitis, unspecified: Secondary | ICD-10-CM

## 2015-10-05 MED ORDER — PHENYLEPH-PROMETHAZINE-COD 5-6.25-10 MG/5ML PO SYRP: 5.0000 mL | ORAL_SOLUTION | Freq: Four times a day (QID) | ORAL | Status: DC | PRN

## 2015-10-05 MED ORDER — DEXAMETHASONE SODIUM PHOSPHATE 10 MG/ML IJ SOLN
10.0000 mg | Freq: Once | INTRAMUSCULAR | Status: AC
  Administered 2015-10-05: 10 mg via INTRAMUSCULAR
  Filled 2015-10-05: qty 1

## 2015-10-05 NOTE — ED Provider Notes (Signed)
CSN: [id number]     Arrival date & time 10/05/15  2032 History  By signing my name below, I, [facility], attest that this documentation has been prepared under the direction and in the presence of Felicie Morn, NP. Electronically Signed: Randell Patient, ED Scribe. 10/05/2015. 8:55 PM.   Chief Complaint  Patient presents with  . Sore Throat  . Generalized Body Aches   The history is provided by the patient. No language interpreter was used.  HPI Comments: Luis Beard is a 35 y.o. male with an hx of asthma who presents to the Emergency Department complaining of constant, moderate, sore throat onset 3 week ago. Patient reports that he has seen by Urgent Care 3 weeks ago for his symptoms and was prescribed medications which improved his symptoms until they worsened 3 days after his visit to the Urgent Care. He endorses associated congestion, neck pain, cough productive of yellowish-green mucus, sneezing, body aches, chills, and sleep disturbance. He has taken his prescribed medications and used a saline nasal spray without relief. He denies nausea and vomiting  Past Medical History  Diagnosis Date  . Asthma    History reviewed. No pertinent past surgical history. History reviewed. No pertinent family history. Social History  Substance Use Topics  . Smoking status: Current Every Day Smoker -- 1.00 packs/day    Types: Cigarettes  . Smokeless tobacco: None  . Alcohol Use: Yes     Comment: occasionally    Review of Systems  Constitutional: Positive for chills.  HENT: Positive for congestion, sneezing and sore throat.   Respiratory: Positive for cough.   Gastrointestinal: Negative for nausea and vomiting.  Musculoskeletal: Positive for neck pain.  Psychiatric/Behavioral: Positive for sleep disturbance (Secondary to cough).  All other systems reviewed and are negative.     Allergies  Review of patient's allergies indicates no known allergies.  Home Medications   Prior  to Admission medications   Medication Sig Start Date End Date Taking? Authorizing Provider  albuterol (PROVENTIL HFA;VENTOLIN HFA) 108 (90 BASE) MCG/ACT inhaler Inhale 2 puffs into the lungs every 6 (six) hours as needed for wheezing or shortness of breath. Patient not taking: Reported on 01/07/2015 09/06/14   Rodolph Bong, MD  amoxicillin (AMOXIL) 500 MG capsule Take 1 capsule (500 mg total) by mouth 2 (two) times daily. 09/18/15   Charm Rings, MD  diphenhydrAMINE (SOMINEX) 25 MG tablet Take 50 mg by mouth at bedtime as needed for allergies.    Historical Provider, MD  guaiFENesin-codeine 100-10 MG/5ML syrup Take 5 mLs by mouth at bedtime as needed for cough. Patient not taking: Reported on 01/07/2015 09/06/14   Rodolph Bong, MD  HYDROcodone-homatropine [facility]) 5-1.5 MG/5ML syrup Take 5 mLs by mouth every 6 (six) hours as needed for cough. 09/18/15   Charm Rings, MD  naproxen (NAPROSYN) 500 MG tablet Take 1 tablet (500 mg total) by mouth 2 (two) times daily with a meal. 01/07/15   Everlene Farrier, PA-C   BP 147/101 mmHg  Pulse 86  Temp(Src) 97.5 F (36.4 C) (Oral)  Resp 20  Ht  (1.803 m)  Wt 290 lb (131.543 kg)  BMI 40.46 kg/m2  SpO2 99% Physical Exam  Constitutional: He is oriented to person, place, and time. He appears well-developed and well-nourished. No distress.  HENT:  Head: Normocephalic and atraumatic.  Nose: Mucosal edema present. Right sinus exhibits maxillary sinus tenderness. Left sinus exhibits maxillary sinus tenderness.  Tenderness to the maxillary sinuses with palpation and  percussion. Edematous nasal mucosa.  Eyes: Conjunctivae and EOM are normal.  Neck: Neck supple. No tracheal deviation present.  No lymphadenopathy.  Cardiovascular: Normal rate.   Pulmonary/Chest: Effort normal and breath sounds normal. No respiratory distress.  Lungs CTA bilateally.   Musculoskeletal: Normal range of motion.  Lymphadenopathy:    He has no cervical adenopathy.  Neurological: He  is alert and oriented to person, place, and time.  Skin: Skin is warm and dry.  Psychiatric: He has a normal mood and affect. His behavior is normal.  Nursing note and vitals reviewed.   ED Course  Procedures   DIAGNOSTIC STUDIES: Oxygen Saturation is 99% on RA, normal by my interpretation.    COORDINATION OF CARE: 8:54 PM Will order chest x-ray. Discussed treatment plan with pt at bedside and pt agreed to plan.  Labs Review Labs Reviewed - No data to display  Imaging Review Dg Chest 2 View  10/05/2015  CLINICAL DATA:  Shortness of breath on and off with cough. EXAM: CHEST  2 VIEW COMPARISON:  01/07/2015 FINDINGS: Borderline bronchitic markings. There is no edema, consolidation, effusion, or pneumothorax. Normal heart size and aortic contours. IMPRESSION: No active cardiopulmonary disease. Electronically Signed   By: Marnee Spring M.D.   On: 10/05/2015 21:34   I have personally reviewed and evaluated these images and lab results as part of my medical decision-making.   EKG Interpretation None      MDM   Final diagnoses:  None    Pharyngitis. Pain with swallowing. Tolerating po intake. Decadron IM. Upper respiratory infection. Care instructions provided. Return precautions discussed.  I personally performed the services described in this documentation, which was scribed in my presence. The recorded information has been reviewed and is accurate.   Felicie Morn, NP 10/06/15 0139  Arby Barrette, MD 10/07/15 [phone]

## 2015-10-05 NOTE — ED Notes (Signed)
Pt states he was seen by Urgent Care ~ 3 weeks ago. Was dx with strep and flu. States he was given amoxacillin and hycodan with no relief of symptoms. States continued pain in throat, body aches and chills. Denies any N/V, some soft stools.

## 2015-10-05 NOTE — ED Notes (Signed)
See providers assessment.  

## 2015-10-05 NOTE — Discharge Instructions (Signed)
Pharyngitis °Pharyngitis is redness, pain, and swelling (inflammation) of your pharynx.  °CAUSES  °Pharyngitis is usually caused by infection. Most of the time, these infections are from viruses (viral) and are part of a cold. However, sometimes pharyngitis is caused by bacteria (bacterial). Pharyngitis can also be caused by allergies. Viral pharyngitis may be spread from person to person by coughing, sneezing, and personal items or utensils (cups, forks, spoons, toothbrushes). Bacterial pharyngitis may be spread from person to person by more intimate contact, such as kissing.  °SIGNS AND SYMPTOMS  °Symptoms of pharyngitis include:   °· Sore throat.   °· Tiredness (fatigue).   °· Low-grade fever.   °· Headache. °· Joint pain and muscle aches. °· Skin rashes. °· Swollen lymph nodes. °· Plaque-like film on throat or tonsils (often seen with bacterial pharyngitis). °DIAGNOSIS  °Your health care provider will ask you questions about your illness and your symptoms. Your medical history, along with a physical exam, is often all that is needed to diagnose pharyngitis. Sometimes, a rapid strep test is done. Other lab tests may also be done, depending on the suspected cause.  °TREATMENT  °Viral pharyngitis will usually get better in 3-4 days without the use of medicine. Bacterial pharyngitis is treated with medicines that kill germs (antibiotics).  °HOME CARE INSTRUCTIONS  °· Drink enough water and fluids to keep your urine clear or pale yellow.   °· Only take over-the-counter or prescription medicines as directed by your health care provider:   °· If you are prescribed antibiotics, make sure you finish them even if you start to feel better.   °· Do not take aspirin.   °· Get lots of rest.   °· Gargle with 8 oz of salt water (½ tsp of salt per 1 qt of water) as often as every 1-2 hours to soothe your throat.   °· Throat lozenges (if you are not at risk for choking) or sprays may be used to soothe your throat. °SEEK MEDICAL  CARE IF:  °· You have large, tender lumps in your neck. °· You have a rash. °· You cough up green, yellow-brown, or bloody spit. °SEEK IMMEDIATE MEDICAL CARE IF:  °· Your neck becomes stiff. °· You drool or are unable to swallow liquids. °· You vomit or are unable to keep medicines or liquids down. °· You have severe pain that does not go away with the use of recommended medicines. °· You have trouble breathing (not caused by a stuffy nose). °MAKE SURE YOU:  °· Understand these instructions. °· Will watch your condition. °· Will get help right away if you are not doing well or get worse. °  °This information is not intended to replace advice given to you by your health care provider. Make sure you discuss any questions you have with your health care provider. °  °Document Released: 08/18/2005 Document Revised: 06/08/2013 Document Reviewed: 04/25/2013 °Elsevier Interactive Patient Education ©2016 [employer] Inc[employer] °Upper Respiratory Infection, Adult °Most upper respiratory infections (URIs) are a viral infection of the air passages leading to the lungs. A URI affects the nose, throat, and upper air passages. The most common type of URI is nasopharyngitis and is typically referred to as "the common cold." °URIs run their course and usually go away on their own. Most of the time, a URI does not require medical attention, but sometimes a bacterial infection in the upper airways can follow a viral infection. This is called a secondary infection. Sinus and middle ear infections are common types of secondary upper respiratory infections. °Bacterial pneumonia   can also complicate a URI. A URI can worsen asthma and chronic obstructive pulmonary disease (COPD). Sometimes, these complications can require emergency medical care and may be life threatening.  °CAUSES °Almost all URIs are caused by viruses. A virus is a type of germ and can spread from one person to another.  °RISKS FACTORS °You may be at risk for a URI if:  °· You  smoke.   °· You have chronic heart or lung disease. °· You have a weakened defense (immune) system.   °· You are very young or very old.   °· You have nasal allergies or asthma. °· You work in crowded or poorly ventilated areas. °· You work in health care facilities or schools. °SIGNS AND SYMPTOMS  °Symptoms typically develop 2-3 days after you come in contact with a cold virus. Most viral URIs last 7-10 days. However, viral URIs from the influenza virus (flu virus) can last 14-18 days and are typically more severe. Symptoms may include:  °· Runny or stuffy (congested) nose.   °· Sneezing.   °· Cough.   °· Sore throat.   °· Headache.   °· Fatigue.   °· Fever.   °· Loss of appetite.   °· Pain in your forehead, behind your eyes, and over your cheekbones (sinus pain). °· Muscle aches.   °DIAGNOSIS  °Your health care provider may diagnose a URI by: °· Physical exam. °· Tests to check that your symptoms are not due to another condition such as: °¨ Strep throat. °¨ Sinusitis. °¨ Pneumonia. °¨ Asthma. °TREATMENT  °A URI goes away on its own with time. It cannot be cured with medicines, but medicines may be prescribed or recommended to relieve symptoms. Medicines may help: °· Reduce your fever. °· Reduce your cough. °· Relieve nasal congestion. °HOME CARE INSTRUCTIONS  °· Take medicines only as directed by your health care provider.   °· Gargle warm saltwater or take cough drops to comfort your throat as directed by your health care provider. °· Use a warm mist humidifier or inhale steam from a shower to increase air moisture. This may make it easier to breathe. °· Drink enough fluid to keep your urine clear or pale yellow.   °· Eat soups and other clear broths and maintain good nutrition.   °· Rest as needed.   °· Return to work when your temperature has returned to normal or as your health care provider advises. You may need to stay home longer to avoid infecting others. You can also use a face mask and careful hand  washing to prevent spread of the virus. °· Increase the usage of your inhaler if you have asthma.   °· Do not use any tobacco products, including cigarettes, chewing tobacco, or electronic cigarettes. If you need help quitting, ask your health care provider. °PREVENTION  °The best way to protect yourself from getting a cold is to practice good hygiene.  °· Avoid oral or hand contact with people with cold symptoms.   °· Wash your hands often if contact occurs.   °There is no clear evidence that vitamin C, vitamin E, echinacea, or exercise reduces the chance of developing a cold. However, it is always recommended to get plenty of rest, exercise, and practice good nutrition.  °SEEK MEDICAL CARE IF:  °· You are getting worse rather than better.   °· Your symptoms are not controlled by medicine.   °· You have chills. °· You have worsening shortness of breath. °· You have brown or red mucus. °· You have yellow or brown nasal discharge. °· You have pain in your face, especially   when you bend forward. °· You have a fever. °· You have swollen neck glands. °· You have pain while swallowing. °· You have white areas in the back of your throat. °SEEK IMMEDIATE MEDICAL CARE IF:  °· You have severe or persistent: °¨ Headache. °¨ Ear pain. °¨ Sinus pain. °¨ Chest pain. °· You have chronic lung disease and any of the following: °¨ Wheezing. °¨ Prolonged cough. °¨ Coughing up blood. °¨ A change in your usual mucus. °· You have a stiff neck. °· You have changes in your: °¨ Vision. °¨ Hearing. °¨ Thinking. °¨ Mood. °MAKE SURE YOU:  °· Understand these instructions. °· Will watch your condition. °· Will get help right away if you are not doing well or get worse. °  °This information is not intended to replace advice given to you by your health care provider. Make sure you discuss any questions you have with your health care provider. °  °Document Released: 02/11/2001 Document Revised: 01/02/2015 Document Reviewed: 11/23/2013 °Elsevier  Interactive Patient Education ©2016 Elsevier Inc. ° °

## 2015-12-18 DIAGNOSIS — G4733 Obstructive sleep apnea (adult) (pediatric): Secondary | ICD-10-CM | POA: Insufficient documentation

## 2015-12-18 DIAGNOSIS — J0141 Acute recurrent pansinusitis: Secondary | ICD-10-CM | POA: Insufficient documentation

## 2015-12-18 DIAGNOSIS — J343 Hypertrophy of nasal turbinates: Secondary | ICD-10-CM | POA: Insufficient documentation

## 2015-12-18 DIAGNOSIS — J302 Other seasonal allergic rhinitis: Secondary | ICD-10-CM | POA: Insufficient documentation

## 2016-01-12 IMAGING — CR DG CHEST 2V
2 series · 2 of 2 positions shown · non-contrast
Comparison: July 10, 2014.

CLINICAL DATA: Cough.

EXAM:
CHEST  2 VIEW

[chest pa]
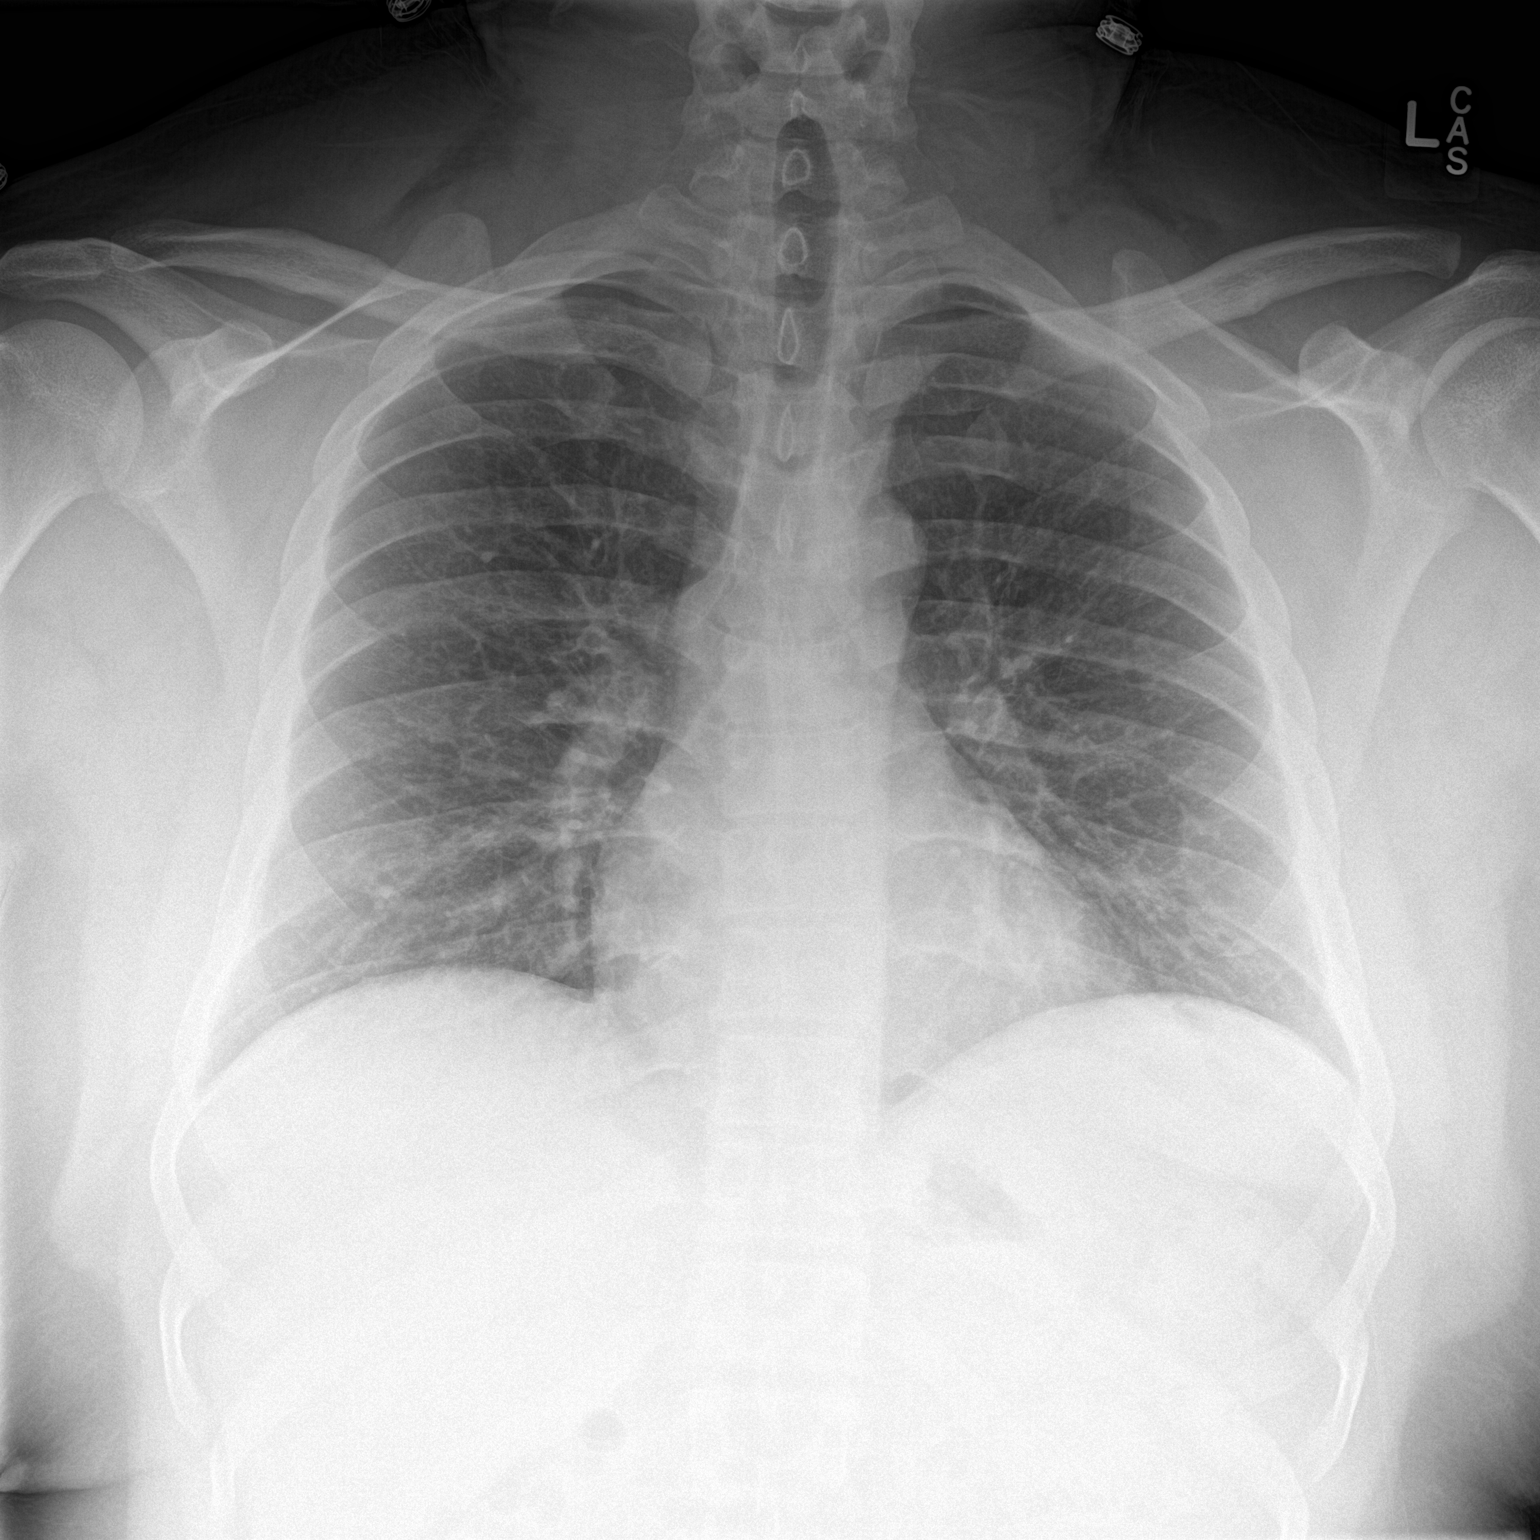

[chest lat]
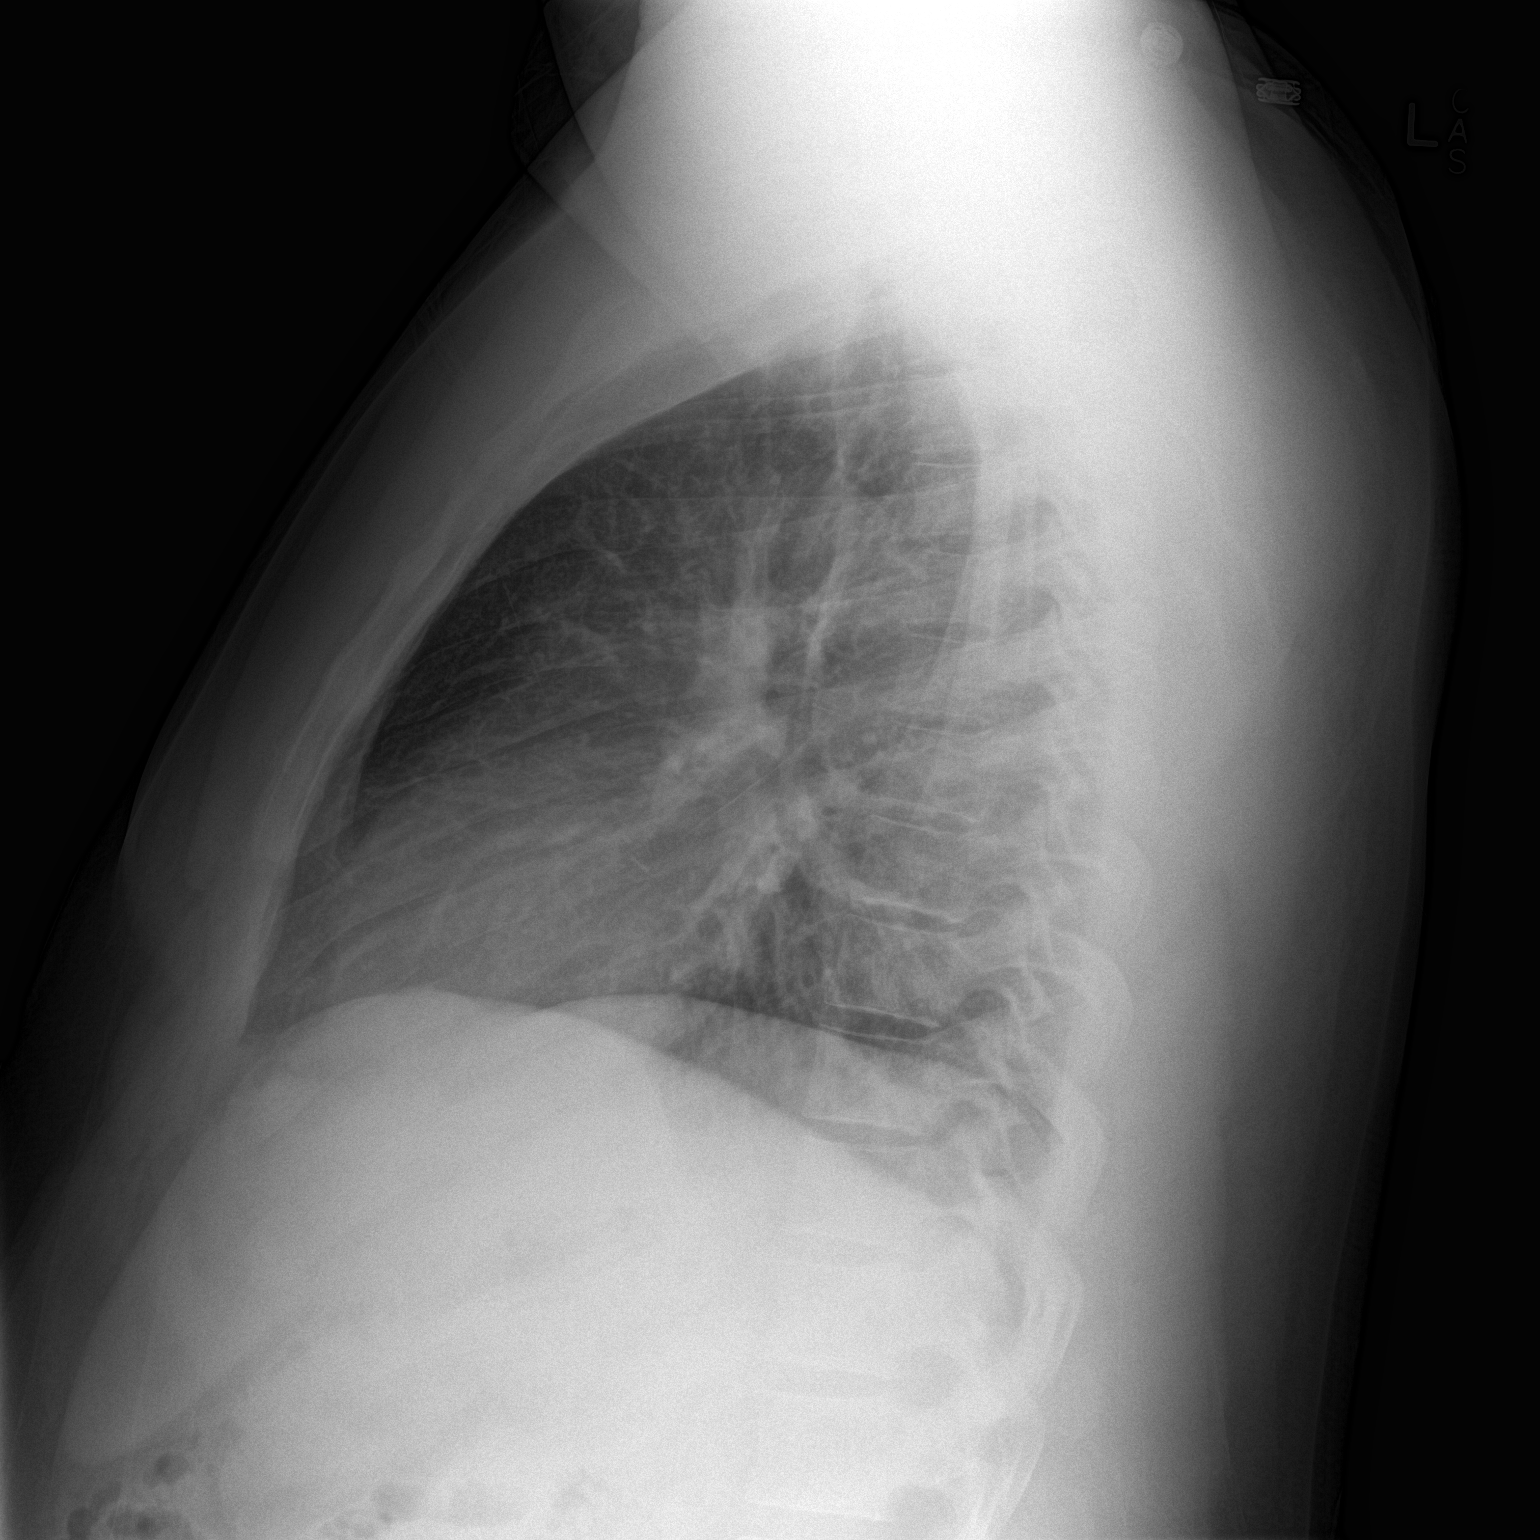

[2 of 2 positions shown; findings below may reference images not displayed]

FINDINGS: The heart size and mediastinal contours are within normal limits.
Both lungs are clear. No pneumothorax or pleural effusion is noted.
The visualized skeletal structures are unremarkable.
IMPRESSION: No acute cardiopulmonary abnormality seen.

## 2016-01-12 IMAGING — CT CT ABD-PELV W/ CM
2 of 4 series · 16 of 46 positions shown, 18 images · IV contrast (Omni 300)
Comparison: None.

CLINICAL DATA: Generalized abdominal pain, emesis, worsening over
the last 24 hr

EXAM:
CT ABDOMEN AND PELVIS WITH CONTRAST
TECHNIQUE: Multidetector CT imaging of the abdomen and pelvis was performed
using the standard protocol following bolus administration of
intravenous contrast.
CONTRAST:  100mL OMNIPAQUE IOHEXOL 300 MG/ML  SOLN

[Series 2: abd/ pelvis 5.0 i30f 1 · axial · 0.85mm/px · z∈[+864,+1304]mm · 13 of 98 slices shown, 15 images]
[im 5/98  soft-tissue]
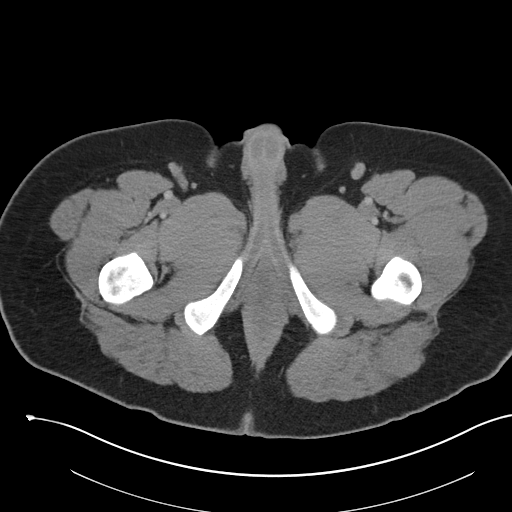
[im 5/98  bone]
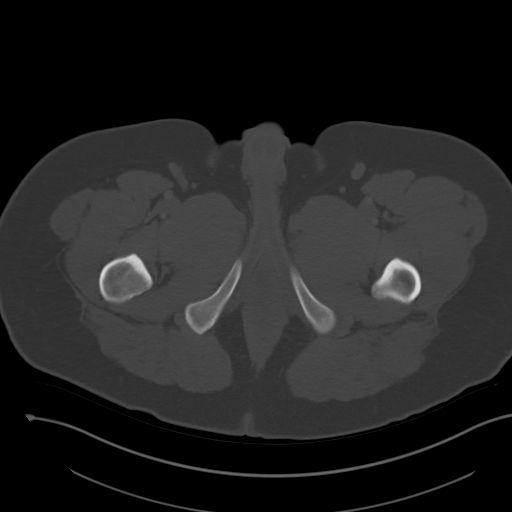
[im 13/98  soft-tissue]
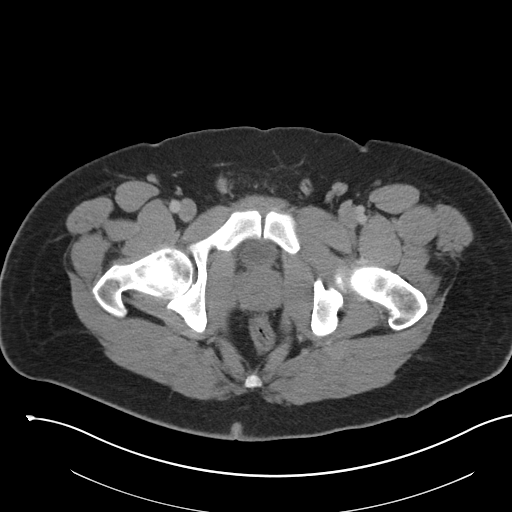
[im 21/98  soft-tissue]
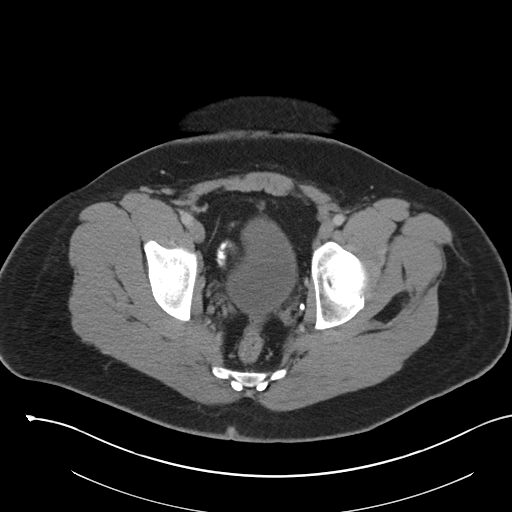
[im 29/98  soft-tissue]
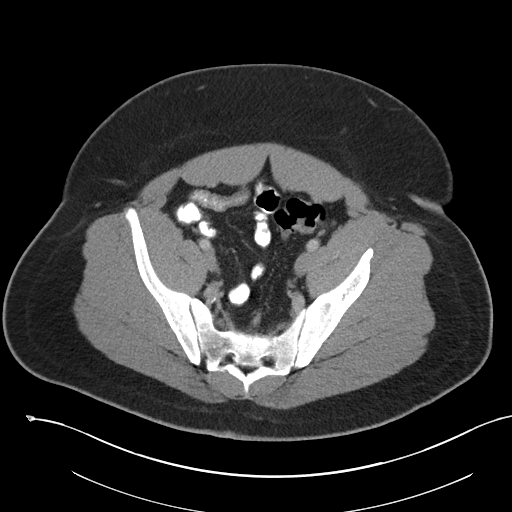
[im 33/98  soft-tissue]
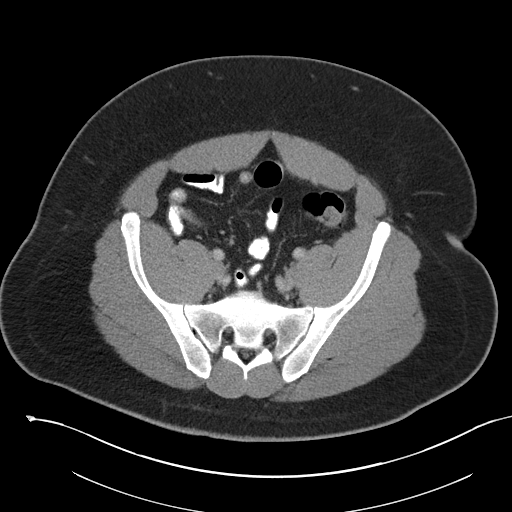
[im 41/98  soft-tissue]
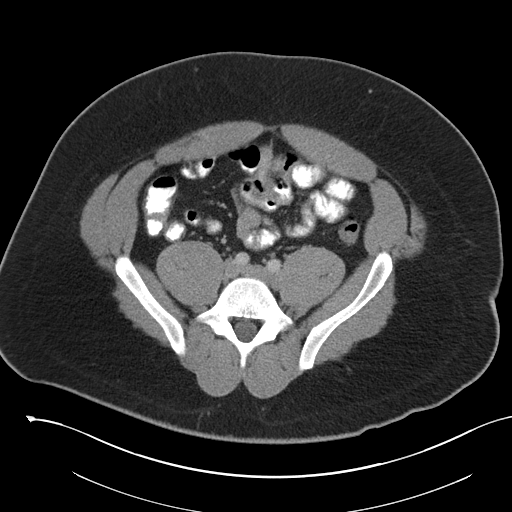
[im 49/98  soft-tissue]
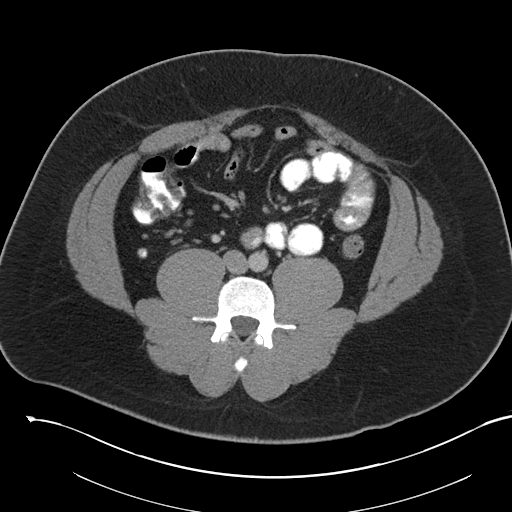
[im 57/98  soft-tissue]
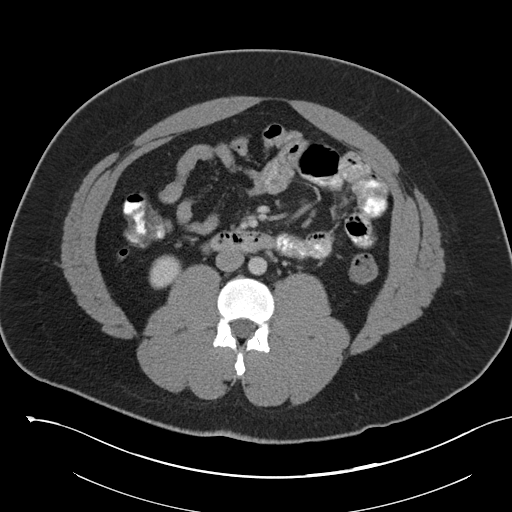
[im 65/98  soft-tissue]
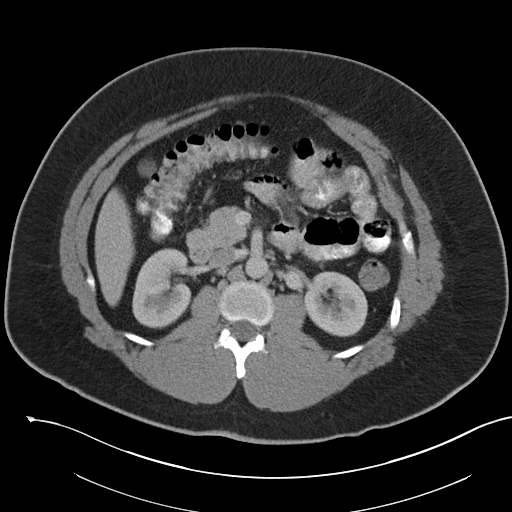
[im 65/98  bone]
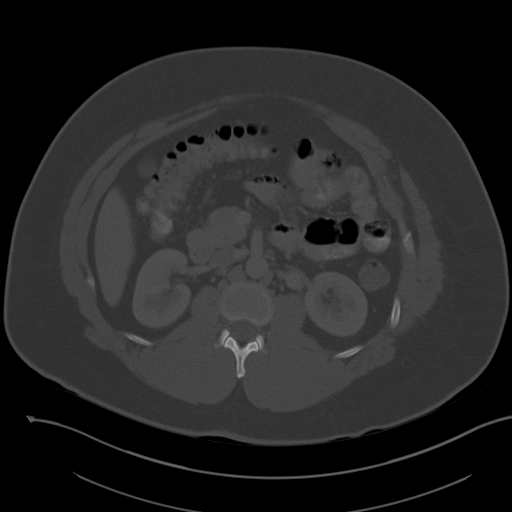
[im 69/98  soft-tissue]
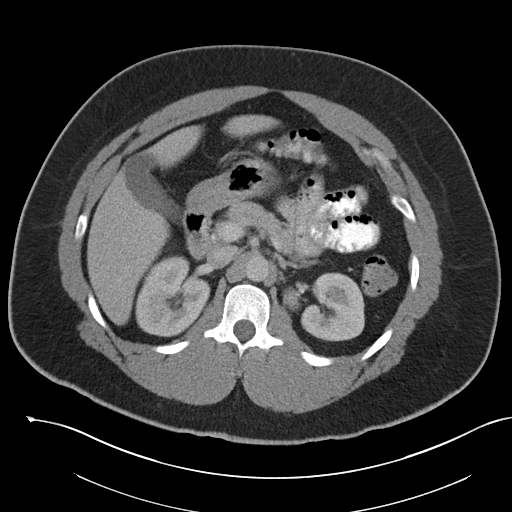
[im 77/98  soft-tissue]
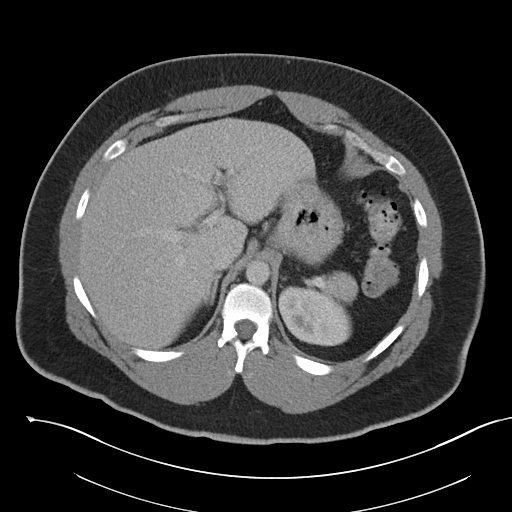
[im 85/98  soft-tissue]
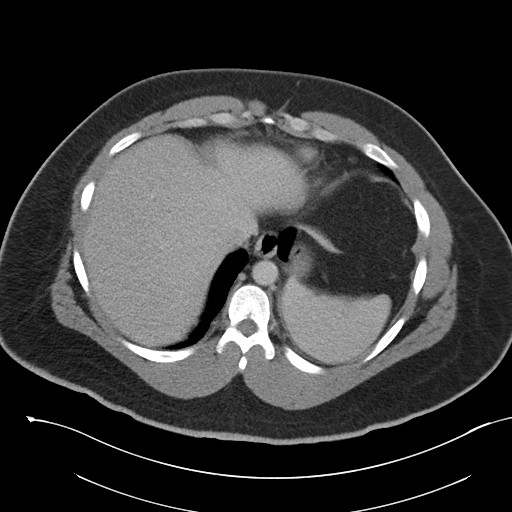
[im 93/98  soft-tissue]
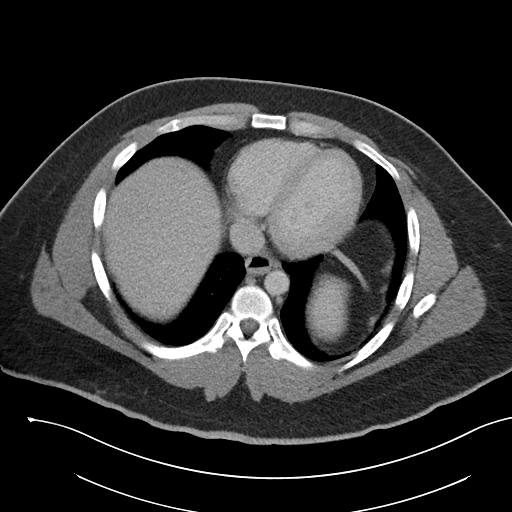

[Series 5: coronals · coronal · 0.77mm/px · 3 of 181 slices shown]
[im 61/181  soft-tissue]
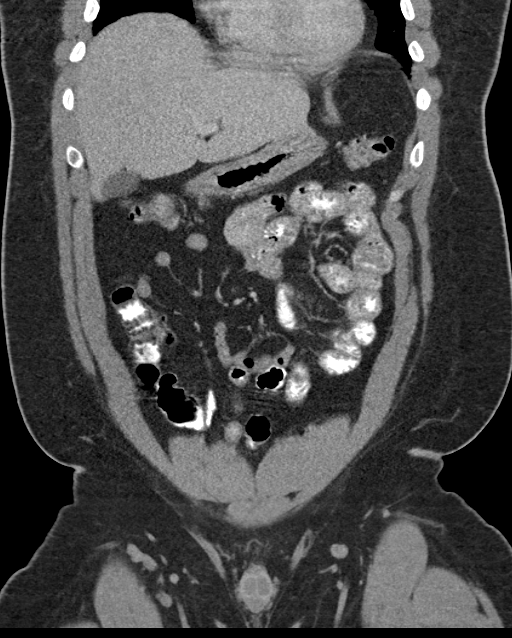
[im 81/181  soft-tissue]
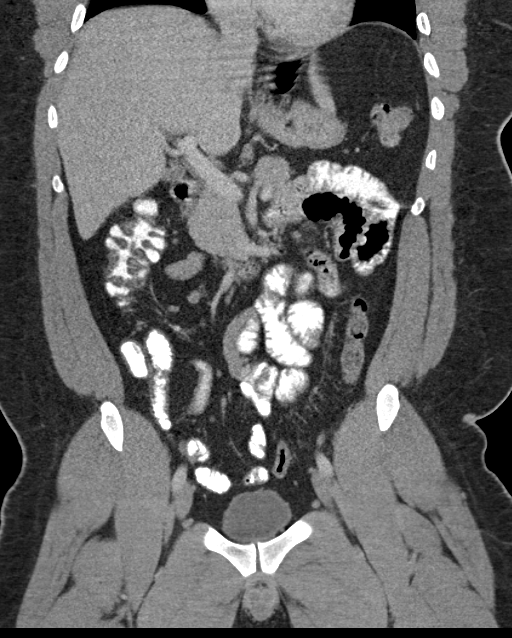
[im 101/181  soft-tissue]
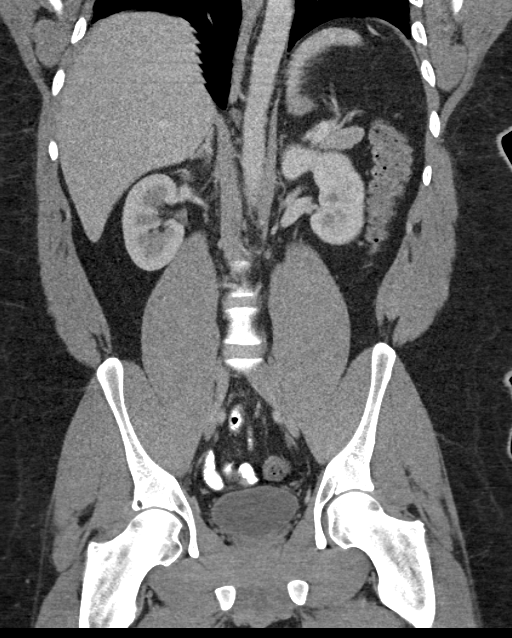

[16 of 46 positions shown; findings below may reference images not displayed]

FINDINGS: Lower chest: Respiratory motion artifact noted. Lung bases otherwise
clear. Normal heart size. No pericardial or pleural effusion.

Abdomen: Liver, gallbladder, biliary system, pancreas, spleen,
accessory splenule, adrenal glands, and kidneys are within normal
limits for age and demonstrate no acute process.

Negative for bowel obstruction, dilatation, ileus, or free air.

No abdominal free fluid, fluid collection, hemorrhage, abscess, or
adenopathy.

Normal retrocecal appendix.

Normal aorta. Negative for aneurysm. No retroperitoneal abnormality.

Pelvis: No pelvic free fluid, fluid collection, hemorrhage, abscess
or adenopathy. Urinary bladder unremarkable. No acute distal bowel
process. No inguinal abnormality or hernia.

No acute osseous finding.
IMPRESSION: No acute intra-abdominal or pelvic finding by CT.

## 2016-02-20 ENCOUNTER — Emergency Department (HOSPITAL_COMMUNITY)
Admission: EM | Admit: 2016-02-20 | Discharge: 2016-02-20 | Disposition: A | Discharge status: Home / Self Care | Payer: No Typology Code available for payment source | Attending: Emergency Medicine | Admitting: Emergency Medicine

## 2016-02-20 ENCOUNTER — Emergency Department (HOSPITAL_COMMUNITY): Payer: No Typology Code available for payment source

## 2016-02-20 ENCOUNTER — Encounter (HOSPITAL_COMMUNITY): Payer: Self-pay | Admitting: Emergency Medicine

## 2016-02-20 DIAGNOSIS — Z79899 Other long term (current) drug therapy: Secondary | ICD-10-CM | POA: Diagnosis not present

## 2016-02-20 DIAGNOSIS — F17210 Nicotine dependence, cigarettes, uncomplicated: Secondary | ICD-10-CM | POA: Diagnosis not present

## 2016-02-20 DIAGNOSIS — J40 Bronchitis, not specified as acute or chronic: Secondary | ICD-10-CM

## 2016-02-20 DIAGNOSIS — R51 Headache: Secondary | ICD-10-CM | POA: Diagnosis not present

## 2016-02-20 DIAGNOSIS — J45909 Unspecified asthma, uncomplicated: Secondary | ICD-10-CM | POA: Insufficient documentation

## 2016-02-20 DIAGNOSIS — J209 Acute bronchitis, unspecified: Secondary | ICD-10-CM | POA: Insufficient documentation

## 2016-02-20 DIAGNOSIS — R05 Cough: Secondary | ICD-10-CM | POA: Diagnosis present

## 2016-02-20 MED ORDER — GUAIFENESIN-CODEINE 100-10 MG/5ML PO SOLN: 5.0000 mL | Freq: Every evening | ORAL | Status: DC | PRN

## 2016-02-20 MED ORDER — ALBUTEROL SULFATE HFA 108 (90 BASE) MCG/ACT IN AERS: 2.0000 | INHALATION_SPRAY | Freq: Four times a day (QID) | RESPIRATORY_TRACT | Status: DC | PRN

## 2016-02-20 MED ORDER — PREDNISONE 20 MG PO TABS: ORAL_TABLET | ORAL | Status: DC

## 2016-02-20 MED ORDER — HYDROCODONE-HOMATROPINE 5-1.5 MG/5ML PO SYRP: 5.0000 mL | ORAL_SOLUTION | Freq: Four times a day (QID) | ORAL | Status: DC | PRN

## 2016-02-20 NOTE — ED Notes (Signed)
Pt states for the last three nights when he lays down he starts coughing and having a headache. Pt reports history of asthma. Pt denies any shortness of breath at this time but states at night while hes coughing he has had some episodes of sob. pts lungs are clear in all fields at this time.

## 2016-02-20 NOTE — ED Notes (Signed)
See PA note for full assessment.  Patient alert and oriented at discharge.  Patient verbalized understanding of discharge instructions and use of inhaler.  Ambulatory to the waiting room with this RN.

## 2016-02-20 NOTE — Discharge Instructions (Signed)

## 2016-02-20 NOTE — ED Provider Notes (Signed)
CSN: [id number]     Arrival date & time 02/20/16  1518 History   First MD Initiated Contact with Patient 02/20/16 1555     Chief Complaint  Patient presents with  . Cough  . Headache     (Consider location/radiation/quality/duration/timing/severity/associated sxs/prior Treatment) HPI   35 year old male with history of asthma presents with complaints of cough. Patient reports gradual onset of progressive worsening cough for the past 3 days. Cough is prominent normally at nighttime. He endorsed having cough productive with yellow sputum, sharp throbbing headache, some occasional wheezing and having pleuritic chest pain. Cough is been persistent keeping him up at night but during the day his symptoms seems to abate. He denies any associated fever, chills, itchy eyes, sneezing, ear pain, neck pain, abdominal pain, back pain. Denies any environmental changes. He has been using DayQuil, NyQuil, Sudafed, and nebulizer machine at home without adequate relief. He is a nonsmoker. No prior history of PE or DVT, no recent surgery, prolonged bed rest, unilateral leg swelling or calf pain.  Past Medical History  Diagnosis Date  . Asthma    History reviewed. No pertinent past surgical history. No family history on file. Social History  Substance Use Topics  . Smoking status: Current Every Day Smoker -- 1.00 packs/day    Types: Cigarettes  . Smokeless tobacco: None  . Alcohol Use: Yes     Comment: occasionally    Review of Systems  All other systems reviewed and are negative.     Allergies  Review of patient's allergies indicates no known allergies.  Home Medications   Prior to Admission medications   Medication Sig Start Date End Date Taking? Authorizing Provider  albuterol (PROVENTIL HFA;VENTOLIN HFA) 108 (90 BASE) MCG/ACT inhaler Inhale 2 puffs into the lungs every 6 (six) hours as needed for wheezing or shortness of breath. Patient not taking: Reported on 01/07/2015 09/06/14   Rodolph BongEvan S  Corey, MD  amoxicillin (AMOXIL) 500 MG capsule Take 1 capsule (500 mg total) by mouth 2 (two) times daily. 09/18/15   Charm RingsErin J Honig, MD  diphenhydrAMINE (SOMINEX) 25 MG tablet Take 50 mg by mouth at bedtime as needed for allergies.    Historical Provider, MD  guaiFENesin-codeine 100-10 MG/5ML syrup Take 5 mLs by mouth at bedtime as needed for cough. Patient not taking: Reported on 01/07/2015 09/06/14   Rodolph BongEvan S Corey, MD  HYDROcodone-homatropine [facility](HYCODAN) 5-1.5 MG/5ML syrup Take 5 mLs by mouth every 6 (six) hours as needed for cough. 09/18/15   Charm RingsErin J Honig, MD  naproxen (NAPROSYN) 500 MG tablet Take 1 tablet (500 mg total) by mouth 2 (two) times daily with a meal. 01/07/15   Everlene FarrierWilliam Dansie, PA-C  Phenyleph-Promethazine-Cod 5-6.25-10 MG/5ML SYRP Take 5 mLs by mouth 4 (four) times daily as needed. 10/05/15   Felicie Mornavid Smith, NP   BP 142/105 mmHg  Pulse 80  Temp(Src) 97.5 F (36.4 C) (Oral)  Resp 22  SpO2 96% Physical Exam  Constitutional: He is oriented to person, place, and time. He appears well-developed and well-nourished. No distress.  African-American male sitting in the chair in no acute discomfort, nontoxic in appearance  HENT:  Head: Atraumatic.  Right Ear: External ear normal.  Left Ear: External ear normal.  Nose: Nose normal.  Mouth/Throat: Oropharynx is clear and moist.  Eyes: Conjunctivae are normal.  Neck: Normal range of motion. Neck supple.  No nuchal rigidity  Cardiovascular: Normal rate and regular rhythm.   Pulmonary/Chest: Effort normal and breath sounds normal. No respiratory distress.  He has no wheezes. He has no rales. He exhibits no tenderness.  Abdominal: Soft. There is no tenderness.  Musculoskeletal: He exhibits no edema.  Lymphadenopathy:    He has no cervical adenopathy.  Neurological: He is alert and oriented to person, place, and time.  Skin: No rash noted.  Psychiatric: He has a normal mood and affect.  Nursing note and vitals reviewed.   ED Course  Procedures  (including critical care time) Labs Review Labs Reviewed - No data to display  Imaging Review Dg Chest 2 View  02/20/2016  CLINICAL DATA:  Shortness of breath, cough, asthma EXAM: CHEST  2 VIEW COMPARISON:  10/05/2015 FINDINGS: The heart size and mediastinal contours are within normal limits. Both lungs are clear. The visualized skeletal structures are unremarkable. IMPRESSION: No active cardiopulmonary disease. Electronically Signed   By: Charlett Nose M.D.   On: 02/20/2016 16:31   I have personally reviewed and evaluated these images and lab results as part of my medical decision-making.   EKG Interpretation None      MDM   Final diagnoses:  Bronchitis    BP 142/105 mmHg  Pulse 80  Temp(Src) 97.5 F (36.4 C) (Oral)  Resp 22  SpO2 96%   4:10 PM Patient presents with persistent nightly cough along with headache. Patient has no nuchal rigidity concerning for meningitis and no fever. Blood pressure is reasonable. Endorse persistent cough, currently not taking any ACE inhibitor. Has some URI symptoms, therefore, chest x-ray ordered to rule out pneumonia given his underlying history of asthma.  4:37 PM CXR neg for pna.  Suspect bronchitis causing his sxs.  Will provide cough suppressant, albuterol inhaler and sxs treatment.  Return precaution discussed.    Fayrene Helper, PA-C 02/20/16 1639  Arby Barrette, MD 02/20/16 [phone]

## 2016-03-06 ENCOUNTER — Encounter (HOSPITAL_COMMUNITY): Payer: Self-pay

## 2016-03-06 ENCOUNTER — Emergency Department (HOSPITAL_COMMUNITY)
Admission: EM | Admit: 2016-03-06 | Discharge: 2016-03-06 | Disposition: A | Discharge status: Home / Self Care | Payer: No Typology Code available for payment source | Attending: Emergency Medicine | Admitting: Emergency Medicine

## 2016-03-06 ENCOUNTER — Emergency Department (HOSPITAL_COMMUNITY): Payer: No Typology Code available for payment source

## 2016-03-06 DIAGNOSIS — J209 Acute bronchitis, unspecified: Secondary | ICD-10-CM | POA: Insufficient documentation

## 2016-03-06 DIAGNOSIS — Z79899 Other long term (current) drug therapy: Secondary | ICD-10-CM | POA: Insufficient documentation

## 2016-03-06 DIAGNOSIS — R05 Cough: Secondary | ICD-10-CM | POA: Diagnosis present

## 2016-03-06 DIAGNOSIS — J45909 Unspecified asthma, uncomplicated: Secondary | ICD-10-CM | POA: Diagnosis not present

## 2016-03-06 DIAGNOSIS — F17210 Nicotine dependence, cigarettes, uncomplicated: Secondary | ICD-10-CM | POA: Insufficient documentation

## 2016-03-06 MED ORDER — AZITHROMYCIN 250 MG PO TABS: ORAL_TABLET | ORAL | Status: DC

## 2016-03-06 MED ORDER — PREDNISONE 10 MG PO TABS: 20.0000 mg | ORAL_TABLET | Freq: Two times a day (BID) | ORAL | Status: DC

## 2016-03-06 MED ORDER — ALBUTEROL SULFATE (2.5 MG/3ML) 0.083% IN NEBU
5.0000 mg | INHALATION_SOLUTION | Freq: Once | RESPIRATORY_TRACT | Status: AC
  Administered 2016-03-06: 5 mg via RESPIRATORY_TRACT
  Filled 2016-03-06: qty 6

## 2016-03-06 MED ORDER — METHYLPREDNISOLONE SODIUM SUCC 125 MG IJ SOLR
125.0000 mg | Freq: Once | INTRAMUSCULAR | Status: AC
  Administered 2016-03-06: 125 mg via INTRAMUSCULAR
  Filled 2016-03-06: qty 2

## 2016-03-06 NOTE — ED Provider Notes (Signed)
CSN: [id number]     Arrival date & time 03/06/16  0915 History   First MD Initiated Contact with Patient 03/06/16 1003     Chief Complaint  Patient presents with  . Cough     (Consider location/radiation/quality/duration/timing/severity/associated sxs/prior Treatment) HPI Comments: Patient is a 35 year old male with no significant past medical history. He presents for evaluation of a several week history of chest congestion, productive cough, generalized malaise. He was seen here one week ago and was told he had bronchitis. He was treated with steroids and an inhaler as well as cough medication, but this does not seem to be helping. He reports continued shortness of breath and productive cough of yellow sputum.  Patient is a 35 y.o. male presenting with cough. The history is provided by the patient.  Cough Cough characteristics:  Productive Sputum characteristics:  Yellow Severity:  Moderate Onset quality:  Gradual Duration:  3 weeks Timing:  Constant Progression:  Worsening Chronicity:  New Smoker: no   Relieved by:  Nothing Worsened by:  Nothing tried   Past Medical History  Diagnosis Date  . Asthma    History reviewed. No pertinent past surgical history. History reviewed. No pertinent family history. Social History  Substance Use Topics  . Smoking status: Current Every Day Smoker -- 1.00 packs/day    Types: Cigarettes  . Smokeless tobacco: None  . Alcohol Use: Yes     Comment: occasionally    Review of Systems  Respiratory: Positive for cough.   All other systems reviewed and are negative.     Allergies  Review of patient's allergies indicates no known allergies.  Home Medications   Prior to Admission medications   Medication Sig Start Date End Date Taking? Authorizing Provider  albuterol (PROVENTIL HFA;VENTOLIN HFA) 108 (90 Base) MCG/ACT inhaler Inhale 2 puffs into the lungs every 6 (six) hours as needed for wheezing or shortness of breath. 02/20/16   Fayrene HelperBowie  Tran, PA-C  amoxicillin (AMOXIL) 500 MG capsule Take 1 capsule (500 mg total) by mouth 2 (two) times daily. 09/18/15   Charm RingsErin J Honig, MD  diphenhydrAMINE (SOMINEX) 25 MG tablet Take 50 mg by mouth at bedtime as needed for allergies.    Historical Provider, MD  guaiFENesin-codeine 100-10 MG/5ML syrup Take 5 mLs by mouth at bedtime as needed for cough. 02/20/16   Fayrene HelperBowie Tran, PA-C  HYDROcodone-homatropine (HYCODAN) 5-1.5 MG/5ML syrup Take 5 mLs by mouth every 6 (six) hours as needed for cough. 02/20/16   Fayrene HelperBowie Tran, PA-C  naproxen (NAPROSYN) 500 MG tablet Take 1 tablet (500 mg total) by mouth 2 (two) times daily with a meal. 01/07/15   Everlene FarrierWilliam Dansie, PA-C  Phenyleph-Promethazine-Cod 5-6.25-10 MG/5ML SYRP Take 5 mLs by mouth 4 (four) times daily as needed. 10/05/15   Felicie Mornavid Smith, NP  predniSONE (DELTASONE) 20 MG tablet 2 tabs po daily x 4 days 02/20/16   Fayrene HelperBowie Tran, PA-C   BP 152/109 mmHg  Pulse 90  Temp(Src) 98.6 F (37 C) (Oral)  Resp 22  Ht 5\' 11"  (1.803 m)  Wt 280 lb (127.007 kg)  BMI 39.07 kg/m2  SpO2 91% Physical Exam  Constitutional: He is oriented to person, place, and time. He appears well-developed and well-nourished. No distress.  HENT:  Head: Normocephalic and atraumatic.  Mouth/Throat: Oropharynx is clear and moist.  Neck: Normal range of motion. Neck supple.  Cardiovascular: Normal rate and regular rhythm.  Exam reveals no friction rub.   No murmur heard. Pulmonary/Chest: Effort normal and breath sounds normal.  No respiratory distress. He has no wheezes. He has no rales.  Abdominal: Soft. Bowel sounds are normal. He exhibits no distension. There is no tenderness.  Musculoskeletal: Normal range of motion. He exhibits no edema.  Neurological: He is alert and oriented to person, place, and time. Coordination normal.  Skin: Skin is warm and dry. He is not diaphoretic.  Nursing note and vitals reviewed.   ED Course  Procedures (including critical care time) Labs Review Labs Reviewed  - No data to display  Imaging Review Dg Chest 2 View  03/06/2016  CLINICAL DATA:  Cough.  Shortness of breath. EXAM: CHEST  2 VIEW COMPARISON:  02/20/2016. FINDINGS: Mediastinum and hilar structures are normal. Mild cardiomegaly. Low lung volumes with mild bibasilar atelectasis and/or infiltrates. No pleural effusion or pneumothorax. IMPRESSION: 1. Mild stable cardiomegaly. 2. No focal infiltrate. Low lung volumes with mild bibasilar atelectasis and/or infiltrates. Electronically Signed   By: Maisie Fushomas  Register   On: 03/06/2016 09:50   I have personally reviewed and evaluated these images and lab results as part of my medical decision-making.    MDM   Final diagnoses:  None    Patient presents with a several week history of persistent cough productive of yellow sputum that is unrelieved with steroids and over-the-counter medications. As the symptoms are not improving with the above interventions and several weeks' time, I feel as though it is appropriate to prescribe an antibiotic. He will be given Zithromax and an additional course of prednisone. He is to return as needed if he worsens.    Geoffery Lyonsouglas Elizah Lydon, MD 03/06/16 1055

## 2016-03-06 NOTE — Discharge Instructions (Signed)
Zithromax and prednisone as prescribed.  Continue using your inhaler 2 puffs every 4 hours as needed for wheezing or difficulty breathing.  Return to the emergency department if you develop severe chest pain, worsening breathing, or other new and concerning symptoms.   Acute Bronchitis Bronchitis is inflammation of the airways that extend from the windpipe into the lungs (bronchi). The inflammation often causes mucus to develop. This leads to a cough, which is the most common symptom of bronchitis.  In acute bronchitis, the condition usually develops suddenly and goes away over time, usually in a couple weeks. Smoking, allergies, and asthma can make bronchitis worse. Repeated episodes of bronchitis may cause further lung problems.  CAUSES Acute bronchitis is most often caused by the same virus that causes a cold. The virus can spread from person to person (contagious) through coughing, sneezing, and touching contaminated objects. SIGNS AND SYMPTOMS   Cough.   Fever.   Coughing up mucus.   Body aches.   Chest congestion.   Chills.   Shortness of breath.   Sore throat.  DIAGNOSIS  Acute bronchitis is usually diagnosed through a physical exam. Your health care provider will also ask you questions about your medical history. Tests, such as chest X-rays, are sometimes done to rule out other conditions.  TREATMENT  Acute bronchitis usually goes away in a couple weeks. Oftentimes, no medical treatment is necessary. Medicines are sometimes given for relief of fever or cough. Antibiotic medicines are usually not needed but may be prescribed in certain situations. In some cases, an inhaler may be recommended to help reduce shortness of breath and control the cough. A cool mist vaporizer may also be used to help thin bronchial secretions and make it easier to clear the chest.  HOME CARE INSTRUCTIONS  Get plenty of rest.   Drink enough fluids to keep your urine clear or pale yellow  (unless you have a medical condition that requires fluid restriction). Increasing fluids may help thin your respiratory secretions (sputum) and reduce chest congestion, and it will prevent dehydration.   Take medicines only as directed by your health care provider.  If you were prescribed an antibiotic medicine, finish it all even if you start to feel better.  Avoid smoking and secondhand smoke. Exposure to cigarette smoke or irritating chemicals will make bronchitis worse. If you are a smoker, consider using nicotine gum or skin patches to help control withdrawal symptoms. Quitting smoking will help your lungs heal faster.   Reduce the chances of another bout of acute bronchitis by washing your hands frequently, avoiding people with cold symptoms, and trying not to touch your hands to your mouth, nose, or eyes.   Keep all follow-up visits as directed by your health care provider.  SEEK MEDICAL CARE IF: Your symptoms do not improve after 1 week of treatment.  SEEK IMMEDIATE MEDICAL CARE IF:  You develop an increased fever or chills.   You have chest pain.   You have severe shortness of breath.  You have bloody sputum.   You develop dehydration.  You faint or repeatedly feel like you are going to pass out.  You develop repeated vomiting.  You develop a severe headache. MAKE SURE YOU:   Understand these instructions.  Will watch your condition.  Will get help right away if you are not doing well or get worse.   This information is not intended to replace advice given to you by your health care provider. Make sure you discuss any questions  you have with your health care provider.   Document Released: 09/25/2004 Document Revised: 09/08/2014 Document Reviewed: 02/08/2013 Elsevier Interactive Patient Education [employer].

## 2016-03-06 NOTE — ED Notes (Signed)
Pt is in stable condition upon d/c and ambulates from ED. 

## 2016-03-06 NOTE — ED Notes (Signed)
Dr. Delo at the bedside 

## 2016-03-06 NOTE — ED Notes (Signed)
Pt presents with worsening cough, pt seen here for same, received inhaler and steroids and cough medication.  Pt took for 1 week, reports relief but reports onset of 102 fever yesterday.  Pt reports shortness of breath.

## 2016-04-09 ENCOUNTER — Ambulatory Visit (HOSPITAL_BASED_OUTPATIENT_CLINIC_OR_DEPARTMENT_OTHER): Payer: No Typology Code available for payment source | Attending: Internal Medicine | Admitting: Internal Medicine

## 2016-04-09 VITALS — Ht 69.0 in | Wt 300.0 lb

## 2016-04-09 DIAGNOSIS — R0683 Snoring: Secondary | ICD-10-CM | POA: Diagnosis not present

## 2016-04-09 DIAGNOSIS — R5383 Other fatigue: Secondary | ICD-10-CM | POA: Insufficient documentation

## 2016-04-09 DIAGNOSIS — G4719 Other hypersomnia: Secondary | ICD-10-CM | POA: Diagnosis not present

## 2016-04-09 DIAGNOSIS — Z6841 Body Mass Index (BMI) 40.0 and over, adult: Secondary | ICD-10-CM | POA: Diagnosis not present

## 2016-04-09 DIAGNOSIS — E669 Obesity, unspecified: Secondary | ICD-10-CM | POA: Insufficient documentation

## 2016-04-09 DIAGNOSIS — G4733 Obstructive sleep apnea (adult) (pediatric): Secondary | ICD-10-CM | POA: Diagnosis not present

## 2016-04-13 DIAGNOSIS — G4733 Obstructive sleep apnea (adult) (pediatric): Secondary | ICD-10-CM

## 2016-04-13 NOTE — Procedures (Signed)
Patient Name: Luis Beard, Luis Beard Date: 04/09/2016 Gender: Male D.O.B: Dec 14, 1980 Age (years): 34 Referring Provider: Nolene Ebbs Height (inches): 51 Interpreting Physician: Baird Lyons MD, ABSM Weight (lbs): 300 RPSGT: Carolin Coy BMI: 44 MRN: [id number] Neck Size: 17.00 CLINICAL INFORMATION Sleep Study Type: Split Night CPAP Indication for sleep study: Excessive Daytime Sleepiness, Fatigue, Obesity, OSA, Snoring Epworth Sleepiness Score: 22  SLEEP STUDY TECHNIQUE As per the AASM Manual for the Scoring of Sleep and Associated Events v2.3 (April 2016) with a hypopnea requiring 4% desaturations. The channels recorded and monitored were frontal, central and occipital EEG, electrooculogram (EOG), submentalis EMG (chin), nasal and oral airflow, thoracic and abdominal wall motion, anterior tibialis EMG, snore microphone, electrocardiogram, and pulse oximetry. Continuous positive airway pressure (CPAP) was initiated when the patient met split night criteria and was titrated according to treat sleep-disordered breathing.  MEDICATIONS Medications taken by the patient : charted for review Medications administered by patient during sleep study : No sleep medicine administered.  RESPIRATORY PARAMETERS Diagnostic Total AHI (/hr): 111.7 RDI (/hr): 116.1 OA Index (/hr): 78.3 CA Index (/hr): 0.0 REM AHI (/hr): 76.4 NREM AHI (/hr): 113.2 Supine AHI (/hr): 100.0 Non-supine AHI (/hr): 113.20 Min O2 Sat (%): 51.00 Mean O2 (%): 74.53 Time below 88% (min): 124.0   Titration Optimal Pressure (cm): 15 AHI at Optimal Pressure (/hr): 0.0 Min O2 at Optimal Pressure (%): 90.0 Supine % at Optimal (%): 0 Sleep % at Optimal (%): 100    SLEEP ARCHITECTURE The recording time for the entire night was 392.2 minutes. During a baseline period of 223.8 minutes, the patient slept for 136.4 minutes in REM and nonREM, yielding a sleep efficiency of 60.9%. Sleep onset after lights out was 6.4 minutes  with a REM latency of 89.0 minutes. The patient spent 40.61% of the night in stage N1 sleep, 55.36% in stage N2 sleep, 0.00% in stage N3 and 4.03% in REM. During the titration period of 163.5 minutes, the patient slept for 133.8 minutes in REM and nonREM, yielding a sleep efficiency of 81.9%. Sleep onset after CPAP initiation was 13.7 minutes with a REM latency of 84.0 minutes. The patient spent 8.59% of the night in stage N1 sleep, 78.70% in stage N2 sleep, 0.00% in stage N3 and 12.70% in REM.  CARDIAC DATA The 2 lead EKG demonstrated sinus rhythm. The mean heart rate was 88.42 beats per minute. Other EKG findings include: None.  LEG MOVEMENT DATA The total Periodic Limb Movements of Sleep (PLMS) were 0. The PLMS index was 0.00 .  IMPRESSIONS - Severe obstructive sleep apnea occurred during the diagnostic portion of the study (AHI = 111.7/hour). An optimal PAP pressure was selected for this patient ( 15 cm of water) - No significant central sleep apnea occurred during the diagnostic portion of the study (CAI = 0.0/hour). - Severe oxygen desaturation was noted during the diagnostic portion of the study (Min O2 = 51.00%). - Supplemental O2 was added and titrated to 3L/m per protocol, prior to CPAP control. - The patient snored with Moderate snoring volume during the diagnostic portion of the study. - No cardiac abnormalities were noted during this study. - Clinically significant periodic limb movements did not occur during sleep.  DIAGNOSIS - Obstructive Sleep Apnea (327.23 [G47.33 ICD-10])  RECOMMENDATIONS - Trial of CPAP therapy on 15 cm H2O with a Medium size Resmed Full Face Mask AirFit F20 mask and heated humidification. - Significant O2 desaturation was noted before CPAP control. Consider reassessment of O2 needs. - Avoid  alcohol, sedatives and other CNS depressants that may worsen sleep apnea and disrupt normal sleep architecture. - Sleep hygiene should be reviewed to assess factors  that may improve sleep quality. - Weight management and regular exercise should be initiated or continued.  [Electronically signed] 04/13/2016 09:45 AM  Baird Lyons MD, ABSM Diplomate, American Board of Sleep Medicine   NPI: [id number]  [place], American Board of Sleep Medicine  ELECTRONICALLY SIGNED ON:  04/13/2016, 9:41 AM [place] PH: [phone]   FX: [phone] [place]

## 2017-04-29 ENCOUNTER — Encounter (HOSPITAL_COMMUNITY): Payer: Self-pay | Admitting: Emergency Medicine

## 2017-04-29 ENCOUNTER — Ambulatory Visit (HOSPITAL_COMMUNITY)
Admission: EM | Admit: 2017-04-29 | Discharge: 2017-04-29 | Disposition: A | Discharge status: Home / Self Care | Payer: No Typology Code available for payment source | Attending: Family Medicine | Admitting: Family Medicine

## 2017-04-29 DIAGNOSIS — S46819A Strain of other muscles, fascia and tendons at shoulder and upper arm level, unspecified arm, initial encounter: Secondary | ICD-10-CM

## 2017-04-29 DIAGNOSIS — S46811A Strain of other muscles, fascia and tendons at shoulder and upper arm level, right arm, initial encounter: Secondary | ICD-10-CM

## 2017-04-29 MED ORDER — DICLOFENAC POTASSIUM 50 MG PO TABS: 50.0000 mg | ORAL_TABLET | Freq: Three times a day (TID) | ORAL | 0 refills | Status: DC

## 2017-04-29 NOTE — Discharge Instructions (Signed)
Apply heat to the muscles that are aching. Limit the use of those same muscles. If it hurts do not do it. It may take several days for complete healing. Take the medication as needed for muscle inflammation and pain.
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
## 2017-05-27 ENCOUNTER — Encounter (HOSPITAL_COMMUNITY): Payer: Self-pay | Admitting: Emergency Medicine

## 2017-05-27 ENCOUNTER — Ambulatory Visit (HOSPITAL_COMMUNITY)
Admission: EM | Admit: 2017-05-27 | Discharge: 2017-05-27 | Disposition: A | Discharge status: Home / Self Care | Payer: Self-pay | Attending: Family Medicine | Admitting: Family Medicine

## 2017-05-27 DIAGNOSIS — J40 Bronchitis, not specified as acute or chronic: Secondary | ICD-10-CM

## 2017-05-27 MED ORDER — HYDROCODONE-HOMATROPINE 5-1.5 MG/5ML PO SYRP: 5.0000 mL | ORAL_SOLUTION | Freq: Four times a day (QID) | ORAL | 0 refills | Status: DC | PRN

## 2017-05-27 MED ORDER — AZITHROMYCIN 250 MG PO TABS: 250.0000 mg | ORAL_TABLET | Freq: Every day | ORAL | 0 refills | Status: DC

## 2017-05-27 NOTE — ED Triage Notes (Signed)
PT reports sinus pain and pressure, drainage, and productive cough for 1 week. PT has tried mucinex with no relief.

## 2017-05-28 NOTE — ED Provider Notes (Signed)
  [facility]   [id number] 05/27/17 Arrival Time: 1850  ASSESSMENT & PLAN:  1. Bronchitis     Meds ordered this encounter  Medications  . HYDROcodone-homatropine (HYCODAN) 5-1.5 MG/5ML syrup    Sig: Take 5 mLs by mouth every 6 (six) hours as needed for cough.    Dispense:  90 mL    Refill:  0  . azithromycin (ZITHROMAX) 250 MG tablet    Sig: Take 1 tablet (250 mg total) by mouth daily. Take first 2 tablets together, then 1 every day until finished.    Dispense:  6 tablet    Refill:  0   Medication sedation precautions given. OTC analgesics and symptom care as needed. He may f/u as needed. Reviewed expectations re: course of current medical issues. Questions answered. Outlined signs and symptoms indicating need for more acute intervention. Patient verbalized understanding. After Visit Summary given.   SUBJECTIVE:  Luis Beard is a 36 y.o. male who presents with complaint of nasal congestion, post-nasal drainage, and a persistent cough for the past two weeks. Nasal congestion getting better. Cough is dry and persistent. No wheezing or SOB. No n/v. Afebrile. Non-smoker. OTC medications without relief. No specific aggravating or alleviating factors reported.  ROS: As per HPI.   OBJECTIVE:  Vitals:   05/27/17 1942 05/27/17 1943  BP:  130/86  Pulse:  78  Resp:  16  Temp:  98 F (36.7 C)  TempSrc:  Oral  SpO2:  97%  Weight: 240 lb (108.9 kg)   Height:  (1.727 m)      General appearance: alert; no distress HEENT: nasal congestion; clear runny nose; throat irritation secondary to post-nasal drainage Neck: supple without LAD Lungs: clear to auscultation bilaterally; active cough, dry Skin: warm and dry Psychological: alert and cooperative; normal mood and affect  No Known Allergies  Past Medical History:  Diagnosis Date  . Asthma    Social History   Social History  . Marital status: Married    Spouse name: N/A  . Number of children: N/A    . Years of education: N/A   Occupational History  . Not on file.   Social History Main Topics  . Smoking status: Former Smoker    Packs/day: 1.00    Types: Cigars    Quit date: 04/15/2017  . Smokeless tobacco: Never Used  . Alcohol use Yes     Comment: occasionally  . Drug use: Yes    Frequency: 7.0 times per week    Types: Marijuana  . Sexual activity: Yes    Birth control/ protection: Condom   Other Topics Concern  . Not on file   Social History Narrative  . No narrative on file            Mardella Layman, MD 05/28/17 0900

## 2017-11-13 ENCOUNTER — Encounter (HOSPITAL_COMMUNITY): Payer: Self-pay | Admitting: Emergency Medicine

## 2017-11-13 ENCOUNTER — Ambulatory Visit (INDEPENDENT_AMBULATORY_CARE_PROVIDER_SITE_OTHER): Payer: Self-pay

## 2017-11-13 ENCOUNTER — Ambulatory Visit (HOSPITAL_COMMUNITY)
Admission: EM | Admit: 2017-11-13 | Discharge: 2017-11-13 | Disposition: A | Discharge status: Home / Self Care | Payer: Self-pay | Attending: Family Medicine | Admitting: Family Medicine

## 2017-11-13 DIAGNOSIS — M75101 Unspecified rotator cuff tear or rupture of right shoulder, not specified as traumatic: Secondary | ICD-10-CM

## 2017-11-13 DIAGNOSIS — M25511 Pain in right shoulder: Secondary | ICD-10-CM

## 2017-11-13 MED ORDER — HYDROCODONE-ACETAMINOPHEN 5-325 MG PO TABS: 1.0000 | ORAL_TABLET | Freq: Four times a day (QID) | ORAL | 0 refills | Status: DC | PRN
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
## 2017-11-13 NOTE — Discharge Instructions (Addendum)
Your exam shows a rotator cuff tear.  The x-rays do not show any bony dislocation or fracture.
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
## 2018-01-23 ENCOUNTER — Encounter (HOSPITAL_COMMUNITY): Payer: Self-pay

## 2018-01-23 ENCOUNTER — Other Ambulatory Visit: Payer: Self-pay

## 2018-01-23 ENCOUNTER — Ambulatory Visit (HOSPITAL_COMMUNITY)
Admission: EM | Admit: 2018-01-23 | Discharge: 2018-01-23 | Disposition: A | Discharge status: Home / Self Care | Payer: Self-pay | Attending: Family Medicine | Admitting: Family Medicine

## 2018-01-23 DIAGNOSIS — Z202 Contact with and (suspected) exposure to infections with a predominantly sexual mode of transmission: Secondary | ICD-10-CM

## 2018-01-23 DIAGNOSIS — R369 Urethral discharge, unspecified: Secondary | ICD-10-CM | POA: Insufficient documentation

## 2018-01-23 DIAGNOSIS — Z87891 Personal history of nicotine dependence: Secondary | ICD-10-CM | POA: Insufficient documentation

## 2018-01-23 MED ORDER — CEFTRIAXONE SODIUM 250 MG IJ SOLR
250.0000 mg | Freq: Once | INTRAMUSCULAR | Status: AC
  Administered 2018-01-23: 250 mg via INTRAMUSCULAR

## 2018-01-23 MED ORDER — AZITHROMYCIN 250 MG PO TABS
ORAL_TABLET | ORAL | Status: AC
  Filled 2018-01-23: qty 4

## 2018-01-23 MED ORDER — AZITHROMYCIN 250 MG PO TABS
1000.0000 mg | ORAL_TABLET | Freq: Once | ORAL | Status: AC
  Administered 2018-01-23: 1000 mg via ORAL

## 2018-01-23 MED ORDER — LIDOCAINE HCL (PF) 1 % IJ SOLN
INTRAMUSCULAR | Status: AC
Start: 2018-01-23 — End: ?
  Filled 2018-01-23: qty 2

## 2018-01-23 MED ORDER — CEFTRIAXONE SODIUM 250 MG IJ SOLR
INTRAMUSCULAR | Status: AC
  Filled 2018-01-23: qty 250

## 2018-01-23 NOTE — ED Provider Notes (Signed)
[facility]    CSN: [id number] Arrival date & time: 01/23/18  1937     History   Chief Complaint Chief Complaint  Patient presents with  . Penile Discharge    HPI Luis Beard is a 37 y.o. male.   This is a 37 year old male, comes in today for STD testings.  Patient endorses 3-day duration of milky penile discharge without dysuria, nausea, abdominal pain or fever.  Patient is sexually active.  Reports that he always use condoms but had sex 3 days ago and the condom broke.      Past Medical History:  Diagnosis Date  . Asthma     There are no active problems to display for this patient.   History reviewed. No pertinent surgical history.     Home Medications    Prior to Admission medications   Medication Sig Start Date End Date Taking? Authorizing Provider  HYDROcodone-acetaminophen (NORCO) 5-325 MG tablet Take 1 tablet by mouth every 6 (six) hours as needed for moderate pain. 11/13/17   Elvina Sidle, MD    Family History History reviewed. No pertinent family history.  Social History Social History   Tobacco Use  . Smoking status: Former Smoker    Packs/day: 1.00    Types: Cigars    Last attempt to quit: 04/15/2017    Years since quitting: 0.7  . Smokeless tobacco: Never Used  Substance Use Topics  . Alcohol use: Yes    Comment: occasionally  . Drug use: Yes    Frequency: 7.0 times per week    Types: Marijuana     Allergies   Patient has no known allergies.   Review of Systems Review of Systems  Constitutional:       As stated in the HPI     Physical Exam Triage Vital Signs ED Triage Vitals  Enc Vitals Group     BP 01/23/18 2030 (!) 150/84     Pulse Rate 01/23/18 2030 (!) 59     Resp 01/23/18 2030 16     Temp 01/23/18 2030 98.1 F (36.7 C)     Temp Source 01/23/18 2030 Oral     SpO2 01/23/18 2030 98 %     Weight --      Height --      Head Circumference --      Peak Flow --      Pain Score 01/23/18 2031 0   Pain Loc --      Pain Edu? --      Excl. in GC? --    No data found.  Updated Vital Signs BP (!) 150/84   Pulse (!) 59   Temp 98.1 F (36.7 C) (Oral)   Resp 16   SpO2 98%   Visual Acuity Right Eye Distance:   Left Eye Distance:   Bilateral Distance:    Right Eye Near:   Left Eye Near:    Bilateral Near:     Physical Exam  Constitutional: He is oriented to person, place, and time. He appears well-developed and well-nourished.  Cardiovascular: Normal rate.  Pulmonary/Chest: Effort normal.  Genitourinary:  Genitourinary Comments: Declined  Neurological: He is alert and oriented to person, place, and time.  Nursing note and vitals reviewed.    UC Treatments / Results  Labs (all labs ordered are listed, but only abnormal results are displayed) Labs Reviewed  URINE CYTOLOGY ANCILLARY ONLY    EKG None  Radiology No results found.  Procedures Procedures (including critical care time)  Medications Ordered in UC Medications  azithromycin (ZITHROMAX) tablet 1,000 mg (1,000 mg Oral Given 01/23/18 2113)  cefTRIAXone (ROCEPHIN) injection 250 mg (250 mg Intramuscular Given 01/23/18 2110)    Initial Impression / Assessment and Plan / UC Course  I have reviewed the triage vital signs and the nursing notes.  Pertinent labs & imaging results that were available during my care of the patient were reviewed by me and considered in my medical decision making (see chart for details).   Final Clinical Impressions(s) / UC Diagnoses   Final diagnoses:  Penile discharge   Declined penile exam.  Treatment the urgent care today with azithromycin and Rocephin.  Cytology pending for gonorrhea, chlamydia, or trichomonas.  Will call patient with results.   Discharge Instructions   None    ED Prescriptions    None     Controlled Substance Prescriptions [place] Controlled Substance Registry consulted? Not Applicable   Lucia Estelle, NP 01/23/18 2124

## 2018-01-23 NOTE — ED Triage Notes (Signed)
Pt presents today with penile discharge that started last night. States he had sex 3-4 days ago and the condom broke. States he does feel irritated in that area as well.

## 2018-01-26 LAB — URINE CYTOLOGY ANCILLARY ONLY
CHLAMYDIA, DNA PROBE: NEGATIVE
Neisseria Gonorrhea: NEGATIVE
Trichomonas: NEGATIVE

## 2018-07-31 ENCOUNTER — Ambulatory Visit (HOSPITAL_COMMUNITY)
Admission: EM | Admit: 2018-07-31 | Discharge: 2018-07-31 | Disposition: A | Discharge status: Home / Self Care | Payer: Self-pay | Attending: Emergency Medicine | Admitting: Emergency Medicine

## 2018-07-31 ENCOUNTER — Other Ambulatory Visit: Payer: Self-pay

## 2018-07-31 ENCOUNTER — Encounter (HOSPITAL_COMMUNITY): Payer: Self-pay | Admitting: Emergency Medicine

## 2018-07-31 DIAGNOSIS — J019 Acute sinusitis, unspecified: Secondary | ICD-10-CM

## 2018-07-31 MED ORDER — IBUPROFEN 800 MG PO TABS
ORAL_TABLET | ORAL | Status: AC
  Filled 2018-07-31: qty 1

## 2018-07-31 MED ORDER — PSEUDOEPH-BROMPHEN-DM 30-2-10 MG/5ML PO SYRP: 5.0000 mL | ORAL_SOLUTION | Freq: Four times a day (QID) | ORAL | 0 refills | Status: DC | PRN

## 2018-07-31 MED ORDER — PREDNISONE 50 MG PO TABS: 50.0000 mg | ORAL_TABLET | Freq: Every day | ORAL | 0 refills | Status: AC

## 2018-07-31 MED ORDER — IBUPROFEN 800 MG PO TABS
800.0000 mg | ORAL_TABLET | Freq: Once | ORAL | Status: AC
  Administered 2018-07-31: 800 mg via ORAL

## 2018-07-31 MED ORDER — CETIRIZINE HCL 10 MG PO CAPS: 10.0000 mg | ORAL_CAPSULE | Freq: Every day | ORAL | 0 refills | Status: DC

## 2018-07-31 MED ORDER — AMOXICILLIN-POT CLAVULANATE 875-125 MG PO TABS: 1.0000 | ORAL_TABLET | Freq: Two times a day (BID) | ORAL | 0 refills | Status: AC

## 2018-07-31 MED ORDER — FLUTICASONE PROPIONATE 50 MCG/ACT NA SUSP: 1.0000 | Freq: Every day | NASAL | 0 refills | Status: DC

## 2018-07-31 NOTE — Discharge Instructions (Addendum)
Please begin taking Augmentin twice daily for the next 10 days to treat sinus infection Please begin prednisone daily with breakfast to help with inflammation and sinus and decreased sinus pressure Please begin daily Zyrtec and Flonase nasal spray to help with nasal swelling, congestion and drainage Please use cough syrup as needed for cough Please drink plenty of fluids  Please follow-up if symptoms not resolving, worsening, developing chest pain, shortness of breath, worsening headache, fevers

## 2018-07-31 NOTE — ED Triage Notes (Signed)
Fever, body aches, coughing, runny nose, sore throat for a week.  Head particularly hurts in the face

## 2018-07-31 NOTE — ED Provider Notes (Signed)
[facility]    CSN: [id number] Arrival date & time: 07/31/18  1723     History   Chief Complaint Chief Complaint  Patient presents with  . URI    HPI Luis Beard is a 36 y.o. male history of asthma presenting today for evaluation of URI symptoms.  Patient states that he has had cough, congestion, runny nose, sore throat and body aches.  Symptoms have been going on for approximately 1 week.  His main complaint is pressure is feeling in his face.  He is also had hot cold chills, but none known fevers recently.  Fever was the first day and a half.  He has felt fatigued and weak.  Denies nausea or vomiting, denies diarrhea.  He has tried Mucinex, DayQuil, Alka-Seltzer, Vicks without relief.  HPI  Past Medical History:  Diagnosis Date  . Asthma     There are no active problems to display for this patient.   History reviewed. No pertinent surgical history.     Home Medications    Prior to Admission medications   Medication Sig Start Date End Date Taking? Authorizing Provider  amoxicillin-clavulanate (AUGMENTIN) 875-125 MG tablet Take 1 tablet by mouth every 12 (twelve) hours for 10 days. 07/31/18 08/10/18  ,  C, PA-C  brompheniramine-pseudoephedrine-DM 30-2-10 MG/5ML syrup Take 5 mLs by mouth 4 (four) times daily as needed. 07/31/18   ,  C, PA-C  Cetirizine HCl 10 MG CAPS Take 1 capsule (10 mg total) by mouth daily for 10 days. 07/31/18 08/10/18  ,  C, PA-C  fluticasone (FLONASE) 50 MCG/ACT nasal spray Place 1-2 sprays into both nostrils daily for 7 days. 07/31/18 08/07/18  ,  C, PA-C  predniSONE (DELTASONE) 50 MG tablet Take 1 tablet (50 mg total) by mouth daily for 5 days. 07/31/18 08/05/18  , Junius Creamer, PA-C    Family History Family History  Problem Relation Age of Onset  . Hypertension Mother   . Diabetes Mother     Social History Social History   Tobacco Use  . Smoking status: Former Smoker      Packs/day: 1.00    Types: Cigars    Last attempt to quit: 04/15/2017    Years since quitting: 1.2  . Smokeless tobacco: Never Used  Substance Use Topics  . Alcohol use: Yes    Comment: occasionally  . Drug use: Yes    Frequency: 7.0 times per week    Types: Marijuana     Allergies   Patient has no known allergies.   Review of Systems Review of Systems  Constitutional: Positive for chills, fatigue and fever. Negative for activity change and appetite change.  HENT: Positive for congestion, rhinorrhea, sinus pressure and sore throat. Negative for ear pain and trouble swallowing.   Eyes: Negative for discharge and redness.  Respiratory: Positive for cough. Negative for chest tightness and shortness of breath.   Cardiovascular: Negative for chest pain.  Gastrointestinal: Negative for abdominal pain, diarrhea, nausea and vomiting.  Musculoskeletal: Positive for myalgias.  Skin: Negative for rash.  Neurological: Positive for headaches. Negative for dizziness and light-headedness.     Physical Exam Triage Vital Signs ED Triage Vitals  Enc Vitals Group     BP 07/31/18 1809 (!) 162/103     Pulse Rate 07/31/18 1809 (!) 119     Resp 07/31/18 1809 (!) 24     Temp 07/31/18 1809 99.4 F (37.4 C)     Temp Source 07/31/18 1809 Oral  SpO2 07/31/18 1809 99 %     Weight --      Height --      Head Circumference --      Peak Flow --      Pain Score 07/31/18 1807 7     Pain Loc --      Pain Edu? --      Excl. in GC? --    No data found.  Updated Vital Signs BP (!) 142/100 Comment: large cuff  Pulse (!) 108   Temp 99.4 F (37.4 C) (Oral)   Resp (!) 24   SpO2 99%   Visual Acuity Right Eye Distance:   Left Eye Distance:   Bilateral Distance:    Right Eye Near:   Left Eye Near:    Bilateral Near:     Physical Exam  Constitutional: He appears well-developed and well-nourished.  HENT:  Head: Normocephalic and atraumatic.  Right TM erythematous, but does not appear  dull, left TM nonerythematous  Bilateral nares occluded by a swollen turbinates, clear rhinorrhea present bilaterally  Oral mucosa pink and moist, no tonsillar enlargement or exudate. Posterior pharynx patent and erythematous, no uvula deviation or swelling. Normal phonation.  Eyes: Conjunctivae are normal.  Neck: Neck supple.  Cardiovascular: Regular rhythm.  No murmur heard. Tachycardic  Pulmonary/Chest: Effort normal and breath sounds normal. No respiratory distress.  Breathing comfortably at rest, CTABL, no wheezing, rales or other adventitious sounds auscultated  Abdominal: Soft. There is no tenderness.  Musculoskeletal: He exhibits no edema.  Neurological: He is alert.  Skin: Skin is warm and dry.  Psychiatric: He has a normal mood and affect.  Nursing note and vitals reviewed.    UC Treatments / Results  Labs (all labs ordered are listed, but only abnormal results are displayed) Labs Reviewed - No data to display  EKG None  Radiology No results found.  Procedures Procedures (including critical care time)  Medications Ordered in UC Medications  ibuprofen (ADVIL,MOTRIN) tablet 800 mg (has no administration in time range)    Initial Impression / Assessment and Plan / UC Course  I have reviewed the triage vital signs and the nursing notes.  Pertinent labs & imaging results that were available during my care of the patient were reviewed by me and considered in my medical decision making (see chart for details).     Patient with URI symptoms greater than 1 week, tachycardic, appears clammy and sweating in room, will treat for sinusitis with Augmentin.  Will also recommend Zyrtec and Flonase to help with congestion.  Prednisone to help with inflammation.  Cough syrup as needed.Discussed strict return precautions. Patient verbalized understanding and is agreeable with plan.  Final Clinical Impressions(s) / UC Diagnoses   Final diagnoses:  Acute sinusitis with  symptoms > 10 days     Discharge Instructions     Please begin taking Augmentin twice daily for the next 10 days to treat sinus infection Please begin prednisone daily with breakfast to help with inflammation and sinus and decreased sinus pressure Please begin daily Zyrtec and Flonase nasal spray to help with nasal swelling, congestion and drainage Please use cough syrup as needed for cough Please drink plenty of fluids  Please follow-up if symptoms not resolving, worsening, developing chest pain, shortness of breath, worsening headache, fevers   ED Prescriptions    Medication Sig Dispense Auth. Provider   amoxicillin-clavulanate (AUGMENTIN) 875-125 MG tablet Take 1 tablet by mouth every 12 (twelve) hours for 10 days. 20 tablet  ,  C, PA-C   predniSONE (DELTASONE) 50 MG tablet Take 1 tablet (50 mg total) by mouth daily for 5 days. 5 tablet ,  C, PA-C   Cetirizine HCl 10 MG CAPS Take 1 capsule (10 mg total) by mouth daily for 10 days. 10 capsule ,  C, PA-C   fluticasone (FLONASE) 50 MCG/ACT nasal spray Place 1-2 sprays into both nostrils daily for 7 days. 1 g ,  C, PA-C   brompheniramine-pseudoephedrine-DM 30-2-10 MG/5ML syrup Take 5 mLs by mouth 4 (four) times daily as needed. 120 mL ,  C, PA-C     Controlled Substance Prescriptions [place] Controlled Substance Registry consulted? Not Applicable   Lew Dawes, [place] 07/31/18 4098

## 2018-08-23 ENCOUNTER — Other Ambulatory Visit: Payer: Self-pay

## 2018-08-23 ENCOUNTER — Emergency Department (HOSPITAL_COMMUNITY): Payer: Self-pay

## 2018-08-23 ENCOUNTER — Encounter (HOSPITAL_COMMUNITY): Payer: Self-pay | Admitting: Emergency Medicine

## 2018-08-23 ENCOUNTER — Emergency Department (HOSPITAL_COMMUNITY)
Admission: EM | Admit: 2018-08-23 | Discharge: 2018-08-23 | Disposition: A | Discharge status: Home / Self Care | Payer: Self-pay | Attending: Emergency Medicine | Admitting: Emergency Medicine

## 2018-08-23 DIAGNOSIS — R51 Headache: Secondary | ICD-10-CM | POA: Insufficient documentation

## 2018-08-23 DIAGNOSIS — H9201 Otalgia, right ear: Secondary | ICD-10-CM | POA: Insufficient documentation

## 2018-08-23 DIAGNOSIS — F1290 Cannabis use, unspecified, uncomplicated: Secondary | ICD-10-CM | POA: Insufficient documentation

## 2018-08-23 DIAGNOSIS — R05 Cough: Secondary | ICD-10-CM | POA: Insufficient documentation

## 2018-08-23 DIAGNOSIS — Z87891 Personal history of nicotine dependence: Secondary | ICD-10-CM | POA: Insufficient documentation

## 2018-08-23 DIAGNOSIS — R0981 Nasal congestion: Secondary | ICD-10-CM | POA: Insufficient documentation

## 2018-08-23 DIAGNOSIS — R591 Generalized enlarged lymph nodes: Secondary | ICD-10-CM | POA: Insufficient documentation

## 2018-08-23 DIAGNOSIS — J020 Streptococcal pharyngitis: Secondary | ICD-10-CM | POA: Insufficient documentation

## 2018-08-23 DIAGNOSIS — M7918 Myalgia, other site: Secondary | ICD-10-CM | POA: Insufficient documentation

## 2018-08-23 LAB — INFLUENZA PANEL BY PCR (TYPE A & B)
Influenza A By PCR: NEGATIVE
Influenza B By PCR: NEGATIVE

## 2018-08-23 LAB — GROUP A STREP BY PCR: Group A Strep by PCR: DETECTED — AB

## 2018-08-23 MED ORDER — PENICILLIN G BENZATHINE 1200000 UNIT/2ML IM SUSP
1.2000 10*6.[IU] | Freq: Once | INTRAMUSCULAR | Status: AC
  Administered 2018-08-23: 1.2 10*6.[IU] via INTRAMUSCULAR
  Filled 2018-08-23: qty 2

## 2018-08-23 MED ORDER — ACETAMINOPHEN 500 MG PO TABS
1000.0000 mg | ORAL_TABLET | Freq: Once | ORAL | Status: AC
  Administered 2018-08-23: 1000 mg via ORAL
  Filled 2018-08-23: qty 2

## 2018-08-23 MED ORDER — IBUPROFEN 400 MG PO TABS
600.0000 mg | ORAL_TABLET | Freq: Once | ORAL | Status: AC
  Administered 2018-08-23: 600 mg via ORAL
  Filled 2018-08-23: qty 1

## 2018-08-23 MED ORDER — IBUPROFEN 600 MG PO TABS: 600.0000 mg | ORAL_TABLET | Freq: Four times a day (QID) | ORAL | 0 refills | Status: DC | PRN

## 2018-08-23 NOTE — ED Provider Notes (Signed)
[facility] Provider Note   CSN: [id number] Arrival date & time: 08/23/18  1101     History   Chief Complaint Chief Complaint  Patient presents with  . Sore Throat  . Generalized Body Aches  . Cough    HPI Luis Beard is a 37 y.o. male.  HPI Patient states he had acute onset subjective fever chills, diffuse myalgias, right ear pain, sore throat, nasal congestion, cough productive of sputum and generalized headache.  States his daughter was diagnosed with strep throat last week.  He was seen in urgent care roughly a month ago and diagnosed with sinusitis.  States he had a course of antibiotics and his symptoms improved.  No chest pain or difficulty breathing.  No new rashes. Past Medical History:  Diagnosis Date  . Asthma     There are no active problems to display for this patient.   History reviewed. No pertinent surgical history.      Home Medications    Prior to Admission medications   Medication Sig Start Date End Date Taking? Authorizing Provider  brompheniramine-pseudoephedrine-DM 30-2-10 MG/5ML syrup Take 5 mLs by mouth 4 (four) times daily as needed. 07/31/18   Wieters, Hallie C, PA-C  Cetirizine HCl 10 MG CAPS Take 1 capsule (10 mg total) by mouth daily for 10 days. 07/31/18 08/10/18  Wieters, Hallie C, PA-C  fluticasone (FLONASE) 50 MCG/ACT nasal spray Place 1-2 sprays into both nostrils daily for 7 days. 07/31/18 08/07/18  Wieters, Hallie C, PA-C  ibuprofen (ADVIL,MOTRIN) 600 MG tablet Take 1 tablet (600 mg total) by mouth every 6 (six) hours as needed for moderate pain. 08/23/18   Loren RacerYelverton, Cobie Marcoux, MD    Family History Family History  Problem Relation Age of Onset  . Hypertension Mother   . Diabetes Mother     Social History Social History   Tobacco Use  . Smoking status: Former Smoker    Packs/day: 1.00    Types: Cigars    Last attempt to quit: 04/15/2017    Years since quitting: 1.3  . Smokeless  tobacco: Never Used  Substance Use Topics  . Alcohol use: Yes    Comment: occasionally  . Drug use: Yes    Frequency: 7.0 times per week    Types: Marijuana     Allergies   Patient has no known allergies.   Review of Systems Review of Systems  Constitutional: Positive for chills, fatigue and fever.  HENT: Positive for congestion, sinus pressure and sore throat.   Eyes: Negative for visual disturbance.  Respiratory: Positive for cough. Negative for shortness of breath and wheezing.   Cardiovascular: Negative for chest pain, palpitations and leg swelling.  Gastrointestinal: Negative for abdominal pain, constipation, diarrhea, nausea and vomiting.  Genitourinary: Negative for dysuria, flank pain and frequency.  Musculoskeletal: Positive for myalgias. Negative for back pain, neck pain and neck stiffness.  Skin: Negative for rash and wound.  Neurological: Positive for headaches. Negative for dizziness, weakness, light-headedness and numbness.  All other systems reviewed and are negative.    Physical Exam Updated Vital Signs BP (!) 148/105 (BP Location: Right Arm)   Pulse 85   Temp 97.8 F (36.6 C) (Oral)   Resp 18   Ht 5\' 9"  (1.753 m)   Wt 117.9 kg   SpO2 97%   BMI 38.40 kg/m   Physical Exam Vitals signs and nursing note reviewed.  Constitutional:      General: He is not in acute distress.  Appearance: Normal appearance. He is well-developed. He is not ill-appearing.  HENT:     Head: Normocephalic and atraumatic.     Right Ear: External ear normal.     Ears:     Comments: Fluid behind the right TM.  Mild erythema.    Nose: Congestion present.     Comments: Bilateral right greater than left mucosal edema.    Mouth/Throat:     Mouth: Mucous membranes are moist.     Pharynx: No oropharyngeal exudate.     Comments: Oropharynx is mildly erythematous.  Uvula is midline. Eyes:     Extraocular Movements: Extraocular movements intact.     Conjunctiva/sclera:  Conjunctivae normal.     Pupils: Pupils are equal, round, and reactive to light.  Neck:     Musculoskeletal: Normal range of motion and neck supple. No neck rigidity or muscular tenderness.     Vascular: No carotid bruit.     Comments: No meningismus Cardiovascular:     Rate and Rhythm: Normal rate and regular rhythm.     Heart sounds: No murmur. No friction rub. No gallop.   Pulmonary:     Effort: Pulmonary effort is normal. No respiratory distress.     Breath sounds: Normal breath sounds. No stridor. No wheezing, rhonchi or rales.  Chest:     Chest wall: No tenderness.  Abdominal:     General: Bowel sounds are normal.     Palpations: Abdomen is soft.     Tenderness: There is no abdominal tenderness. There is no guarding or rebound.  Musculoskeletal: Normal range of motion.        General: No swelling or tenderness.     Right lower leg: No edema.     Left lower leg: No edema.  Lymphadenopathy:     Cervical: Cervical adenopathy present.  Skin:    General: Skin is warm and dry.     Findings: No erythema or rash.  Neurological:     General: No focal deficit present.     Mental Status: He is alert and oriented to person, place, and time.     Comments: Moving all extremities without focal deficit.  Sensation intact.  Psychiatric:        Mood and Affect: Mood normal.        Behavior: Behavior normal.      ED Treatments / Results  Labs (all labs ordered are listed, but only abnormal results are displayed) Labs Reviewed  GROUP A STREP BY PCR - Abnormal; Notable for the following components:      Result Value   Group A Strep by PCR DETECTED (*)    All other components within normal limits  INFLUENZA PANEL BY PCR (TYPE A & B)    EKG None  Radiology Dg Chest 2 View  Result Date: 08/23/2018 CLINICAL DATA:  Cough and congestion for a few days, ear infection 2 weeks ago, history asthma, former smoker EXAM: CHEST - 2 VIEW COMPARISON:  03/06/2016 FINDINGS: Upper normal heart  size. Mediastinal contours and pulmonary vascularity normal. Minimal LEFT basilar atelectasis. Lungs otherwise clear. No infiltrate, pleural effusion or pneumothorax. Bones unremarkable. IMPRESSION: Minimal LEFT basilar atelectasis. Electronically Signed   By: Ulyses Southward M.D.   On: 08/23/2018 11:45    Procedures Procedures (including critical care time)  Medications Ordered in ED Medications  penicillin g benzathine (BICILLIN LA) 1200000 UNIT/2ML injection 1.2 Million Units (has no administration in time range)  ibuprofen (ADVIL,MOTRIN) tablet 600 mg (600 mg Oral Given  08/23/18 1134)  acetaminophen (TYLENOL) tablet 1,000 mg (1,000 mg Oral Given 08/23/18 1134)     Initial Impression / Assessment and Plan / ED Course  I have reviewed the triage vital signs and the nursing notes.  Pertinent labs & imaging results that were available during my care of the patient were reviewed by me and considered in my medical decision making (see chart for details).     Strep is positive.  Given IM penicillin.  Advised to treat symptoms with Tylenol and ibuprofen.  Strict return precautions given.  Final Clinical Impressions(s) / ED Diagnoses   Final diagnoses:  Strep pharyngitis    ED Discharge Orders         Ordered    ibuprofen (ADVIL,MOTRIN) 600 MG tablet  Every 6 hours PRN     08/23/18 1245           Loren RacerYelverton, Kirstein Baxley, MD 08/23/18 1245

## 2018-08-23 NOTE — ED Notes (Signed)
Patient transported to X-ray 

## 2018-08-23 NOTE — ED Notes (Signed)
Pt to xray

## 2018-08-23 NOTE — ED Notes (Signed)
Pt back from X-ray.  

## 2018-08-23 NOTE — ED Triage Notes (Signed)
Pt. Stated, I went to UC a couple weeks ago with the same sym,ptoms and its still going on, cough, generalized body aches, and cough

## 2018-10-18 ENCOUNTER — Encounter: Payer: Self-pay | Admitting: Emergency Medicine

## 2018-10-18 ENCOUNTER — Ambulatory Visit
Admission: EM | Admit: 2018-10-18 | Discharge: 2018-10-18 | Disposition: A | Discharge status: Home / Self Care | Payer: Self-pay | Attending: Family Medicine | Admitting: Family Medicine

## 2018-10-18 DIAGNOSIS — Z202 Contact with and (suspected) exposure to infections with a predominantly sexual mode of transmission: Secondary | ICD-10-CM | POA: Insufficient documentation

## 2018-10-18 MED ORDER — METRONIDAZOLE 500 MG PO TABS: 2000.0000 mg | ORAL_TABLET | Freq: Once | ORAL | 0 refills | Status: AC

## 2018-10-18 MED ORDER — AZITHROMYCIN 500 MG PO TABS
1000.0000 mg | ORAL_TABLET | Freq: Once | ORAL | Status: AC
  Administered 2018-10-18: 1000 mg via ORAL

## 2018-10-18 MED ORDER — CEFTRIAXONE SODIUM 250 MG IJ SOLR
250.0000 mg | Freq: Once | INTRAMUSCULAR | Status: AC
  Administered 2018-10-18: 250 mg via INTRAMUSCULAR

## 2018-10-18 NOTE — ED Triage Notes (Signed)
Pt presents to [facility] for assessment after a former partner told him she tested positive for chlamydia and/or trich.  Denies any known symptoms.
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
## 2018-10-18 NOTE — ED Notes (Signed)
Patient able to ambulate independently  

## 2018-10-18 NOTE — Discharge Instructions (Signed)
We have treated you today for gonorrhea and chlamydia, with rocephin and azithromycin. Please go and pick up metronidazole/flagyl and take later this evening with food or tomorrow morning to treat trichomoniasis.  Please refrain from sexual activity for 7 days while medicine is clearing infection.  We are testing you for Gonorrhea, Chlamydia and Trichomonas. We will call you if anything is positive and let you know if you require any further treatment. Please inform partner of any positive results.  Please return if symptoms not improving with treatment, development of fever, nausea, vomiting, abdominal pain, scrotal pain.

## 2018-10-19 LAB — URINE CYTOLOGY ANCILLARY ONLY
Chlamydia: NEGATIVE
Neisseria Gonorrhea: NEGATIVE
Trichomonas: NEGATIVE

## 2018-10-19 NOTE — ED Provider Notes (Signed)
[facility]    CSN: [id number] Arrival date & time: 10/18/18  1710     History   Chief Complaint Chief Complaint  Patient presents with  . Exposure to STD    HPI Luis Beard is a 38 y.o. male history of asthma presenting today for evaluation of STDs.  Patient states that he had possible exposure to chlamydia trichomonas.  Notes that a partner told him that he should be checked.  He is unsure exactly of what STD was positive.  He denies any symptoms.  Denies any penile discharge, dysuria, rashes or lesions, lower abdominal pain.  Denies nausea or vomiting.  States that he had exposure to this partner approximately 1 month ago.  HPI  Past Medical History:  Diagnosis Date  . Asthma     There are no active problems to display for this patient.   History reviewed. No pertinent surgical history.     Home Medications    Prior to Admission medications   Medication Sig Start Date End Date Taking? Authorizing Provider  brompheniramine-pseudoephedrine-DM 30-2-10 MG/5ML syrup Take 5 mLs by mouth 4 (four) times daily as needed. 07/31/18   Arend Bahl C, PA-C  Cetirizine HCl 10 MG CAPS Take 1 capsule (10 mg total) by mouth daily for 10 days. 07/31/18 08/10/18  Cherryl Babin C, PA-C  fluticasone (FLONASE) 50 MCG/ACT nasal spray Place 1-2 sprays into both nostrils daily for 7 days. 07/31/18 08/07/18  Kymoni Monday C, PA-C  ibuprofen (ADVIL,MOTRIN) 600 MG tablet Take 1 tablet (600 mg total) by mouth every 6 (six) hours as needed for moderate pain. 08/23/18   Loren Racer, MD    Family History Family History  Problem Relation Age of Onset  . Hypertension Mother   . Diabetes Mother     Social History Social History   Tobacco Use  . Smoking status: Former Smoker    Packs/day: 1.00    Types: Cigars    Last attempt to quit: 04/15/2017    Years since quitting: 1.5  . Smokeless tobacco: Never Used  Substance Use Topics  . Alcohol use: Yes    Comment:  occasionally  . Drug use: Yes    Frequency: 7.0 times per week    Types: Marijuana     Allergies   Patient has no known allergies.   Review of Systems Review of Systems  Constitutional: Negative for fever.  HENT: Negative for sore throat.   Respiratory: Negative for shortness of breath.   Cardiovascular: Negative for chest pain.  Gastrointestinal: Negative for abdominal pain, nausea and vomiting.  Genitourinary: Negative for difficulty urinating, discharge, dysuria, frequency, penile pain, penile swelling, scrotal swelling and testicular pain.  Skin: Negative for rash.  Neurological: Negative for dizziness, light-headedness and headaches.     Physical Exam Triage Vital Signs ED Triage Vitals  Enc Vitals Group     BP 10/18/18 1721 (!) 157/118     Pulse Rate 10/18/18 1721 64     Resp 10/18/18 1721 18     Temp 10/18/18 1721 (!) 97 F (36.1 C)     Temp Source 10/18/18 1721 Oral     SpO2 10/18/18 1721 97 %     Weight --      Height --      Head Circumference --      Peak Flow --      Pain Score 10/18/18 1722 0     Pain Loc --      Pain Edu? --  Excl. in [place]? --    No data found.  Updated Vital Signs BP (!) 157/118 (BP Location: Left Arm)   Pulse 64   Temp (!) 97 F (36.1 C) (Oral)   Resp 18   SpO2 97%   Visual Acuity Right Eye Distance:   Left Eye Distance:   Bilateral Distance:    Right Eye Near:   Left Eye Near:    Bilateral Near:     Physical Exam Vitals signs and nursing note reviewed.  Constitutional:      Appearance: He is well-developed.     Comments: No acute distress  HENT:     Head: Normocephalic and atraumatic.     Nose: Nose normal.  Eyes:     Conjunctiva/sclera: Conjunctivae normal.  Neck:     Musculoskeletal: Neck supple.  Cardiovascular:     Rate and Rhythm: Normal rate.  Pulmonary:     Effort: Pulmonary effort is normal. No respiratory distress.  Abdominal:     General: There is no distension.     Comments: Nontender light  deep palpation throughout abdomen  Musculoskeletal: Normal range of motion.  Skin:    General: Skin is warm and dry.  Neurological:     Mental Status: He is alert and oriented to person, place, and time.      UC Treatments / Results  Labs (all labs ordered are listed, but only abnormal results are displayed) Labs Reviewed  URINE CYTOLOGY ANCILLARY ONLY    EKG None  Radiology No results found.  Procedures Procedures (including critical care time)  Medications Ordered in UC Medications  cefTRIAXone (ROCEPHIN) injection 250 mg (250 mg Intramuscular Given 10/18/18 1752)  azithromycin (ZITHROMAX) tablet 1,000 mg (1,000 mg Oral Given 10/18/18 1752)    Initial Impression / Assessment and Plan / UC Course  I have reviewed the triage vital signs and the nursing notes.  Pertinent labs & imaging results that were available during my care of the patient were reviewed by me and considered in my medical decision making (see chart for details).     Patient with exposure to positive STD, unclear which, will provide empiric treatment today with Rocephin and azithromycin.  Provided metronidazole to take later today to avoid nausea and upset stomach with azithromycin and metronidazole together.  Refrain from intercourse.  Urine cytology obtained, will call patient with results and provide any further treatment if needed.Discussed strict return precautions. Patient verbalized understanding and is agreeable with plan.  Final Clinical Impressions(s) / UC Diagnoses   Final diagnoses:  Exposure to STD     Discharge Instructions     We have treated you today for gonorrhea and chlamydia, with rocephin and azithromycin. Please go and pick up metronidazole/flagyl and take later this evening with food or tomorrow morning to treat trichomoniasis.  Please refrain from sexual activity for 7 days while medicine is clearing infection.  We are testing you for Gonorrhea, Chlamydia and Trichomonas. We  will call you if anything is positive and let you know if you require any further treatment. Please inform partner of any positive results.  Please return if symptoms not improving with treatment, development of fever, nausea, vomiting, abdominal pain, scrotal pain.   ED Prescriptions    Medication Sig Dispense Auth. Provider   metroNIDAZOLE (FLAGYL) 500 MG tablet Take 4 tablets (2,000 mg total) by mouth once for 1 dose. With food 4 tablet Gifford Ballon, Donora C, PA-C     Controlled Substance Prescriptions [place] Controlled Substance Registry consulted? Not  Applicable   Lew DawesWieters, Laloni Rowton C, [place] 10/19/18 2051

## 2019-01-19 ENCOUNTER — Encounter: Payer: Self-pay | Admitting: Emergency Medicine

## 2019-01-19 ENCOUNTER — Other Ambulatory Visit: Payer: Self-pay

## 2019-01-19 ENCOUNTER — Ambulatory Visit
Admission: EM | Admit: 2019-01-19 | Discharge: 2019-01-19 | Disposition: A | Discharge status: Home / Self Care | Payer: Self-pay | Attending: Physician Assistant | Admitting: Physician Assistant

## 2019-01-19 DIAGNOSIS — H9202 Otalgia, left ear: Secondary | ICD-10-CM

## 2019-01-19 DIAGNOSIS — H9201 Otalgia, right ear: Secondary | ICD-10-CM

## 2019-01-19 DIAGNOSIS — H6593 Unspecified nonsuppurative otitis media, bilateral: Secondary | ICD-10-CM

## 2019-01-19 MED ORDER — CETIRIZINE HCL 10 MG PO TABS: 10.0000 mg | ORAL_TABLET | Freq: Every day | ORAL | 1 refills | Status: DC

## 2019-01-19 MED ORDER — IPRATROPIUM BROMIDE 0.06 % NA SOLN: 2.0000 | Freq: Four times a day (QID) | NASAL | 12 refills | Status: DC

## 2019-01-19 MED ORDER — NEOMYCIN-POLYMYXIN-HC 3.5-10000-1 OT SUSP: 4.0000 [drp] | Freq: Three times a day (TID) | OTIC | 0 refills | Status: AC

## 2019-01-19 MED ORDER — FLUTICASONE PROPIONATE 50 MCG/ACT NA SUSP: 2.0000 | Freq: Every day | NASAL | 0 refills | Status: DC

## 2019-01-19 NOTE — ED Triage Notes (Signed)
Pt presents to [facility] for assessment of right ear pain x 1 week, and now pain is moving into his right jaw/neck area.

## 2019-01-19 NOTE — ED Provider Notes (Signed)
[facility]    CSN: [id number] Arrival date & time: 01/19/19  1650     History   Chief Complaint Chief Complaint  Patient presents with  . Otalgia    HPI Renee Ramuslonzo Starrett is a [age] y.o. male.   38 year old male comes in for 1 week history of right ear pain. Denies changes in hearing, drainage. He has rhinorrhea, nasal congestion sneezing, watery/itching eyes. He has cough first thing in the morning. Denies shortness of breath, wheezing. Denies fever, chills, night sweats. Denies body aches. Has not been on allergy medicines. Denies sick contact.      Past Medical History:  Diagnosis Date  . Asthma     There are no active problems to display for this patient.   History reviewed. No pertinent surgical history.     Home Medications    Prior to Admission medications   Medication Sig Start Date End Date Taking? Authorizing Provider  cetirizine (ZYRTEC ALLERGY) 10 MG tablet Take 1 tablet (10 mg total) by mouth daily. 01/19/19   Cathie HoopsYu, Makhia Vosler V, PA-C  fluticasone (FLONASE) 50 MCG/ACT nasal spray Place 2 sprays into both nostrils daily. 01/19/19   Cathie HoopsYu, Eliud Polo V, PA-C  ibuprofen (ADVIL,MOTRIN) 600 MG tablet Take 1 tablet (600 mg total) by mouth every 6 (six) hours as needed for moderate pain. 08/23/18   Loren RacerYelverton, David, MD  ipratropium (ATROVENT) 0.06 % nasal spray Place 2 sprays into both nostrils 4 (four) times daily. 01/19/19   Cathie HoopsYu, Riaz Onorato V, PA-C  neomycin-polymyxin-hydrocortisone (CORTISPORIN) 3.5-10000-1 OTIC suspension Place 4 drops into the right ear 3 (three) times daily for 7 days. 01/19/19 01/26/19  Belinda FisherYu, Lilo Wallington V, PA-C    Family History Family History  Problem Relation Age of Onset  . Hypertension Mother   . Diabetes Mother     Social History Social History   Tobacco Use  . Smoking status: Former Smoker    Packs/day: 1.00    Types: Cigars    Last attempt to quit: 04/15/2017    Years since quitting: 1.7  . Smokeless tobacco: Never Used  Substance Use Topics  .  Alcohol use: Yes    Comment: occasionally  . Drug use: Yes    Frequency: 7.0 times per week    Types: Marijuana     Allergies   Patient has no known allergies.   Review of Systems Review of Systems  Reason unable to perform ROS: See HPI as above.     Physical Exam Triage Vital Signs ED Triage Vitals [01/19/19 1659]  Enc Vitals Group     BP (!) 148/109     Pulse Rate 90     Resp 18     Temp 98 F (36.7 C)     Temp Source Oral     SpO2 94 %     Weight      Height      Head Circumference      Peak Flow      Pain Score 8     Pain Loc      Pain Edu?      Excl. in [place]?    No data found.  Updated Vital Signs BP (!) 148/109 (BP Location: Left Arm)   Pulse 90   Temp 98 F (36.7 C) (Oral)   Resp 18   SpO2 94%   Physical Exam Constitutional:      General: He is not in acute distress.    Appearance: Normal appearance. He is not ill-appearing,  toxic-appearing or diaphoretic.  HENT:     Head: Normocephalic and atraumatic.     Ears:     Comments: Tenderness to palpation of right tragus. Mild swelling with erythema along the ear canal. Mid ear effusion without erythema or bulging to the TM.   No tenderness to palpation of left tragus. No swelling, erythema to the ear canal. Mid ear effusion without erythema or bulging to the TM.     Nose: Congestion and rhinorrhea present. Rhinorrhea is clear.     Mouth/Throat:     Mouth: Mucous membranes are moist.     Pharynx: Oropharynx is clear. Uvula midline.  Neck:     Musculoskeletal: Normal range of motion and neck supple.  Cardiovascular:     Rate and Rhythm: Normal rate and regular rhythm.     Heart sounds: Normal heart sounds. No murmur. No friction rub. No gallop.   Pulmonary:     Effort: Pulmonary effort is normal. No accessory muscle usage, prolonged expiration, respiratory distress or retractions.     Comments: Lungs clear to auscultation without adventitious lung sounds. Neurological:     General: No focal deficit  present.     Mental Status: He is alert and oriented to person, place, and time.      UC Treatments / Results  Labs (all labs ordered are listed, but only abnormal results are displayed) Labs Reviewed - No data to display  EKG None  Radiology No results found.  Procedures Procedures (including critical care time)  Medications Ordered in UC Medications - No data to display  Initial Impression / Assessment and Plan / UC Course  I have reviewed the triage vital signs and the nursing notes.  Pertinent labs & imaging results that were available during my care of the patient were reviewed by me and considered in my medical decision making (see chart for details).    Discussed pain most likely due to mid ear effusion for eustachian tube dysfunction. However, given tender to palpation of tragus with ear canal erythema, will also cover for otitis externa with cortisporin. Start flonase, atrovent, zyrtec as directed. Return precautions given. Patient expresses understanding and agrees to plan.  Final Clinical Impressions(s) / UC Diagnoses   Final diagnoses:  Fluid level behind tympanic membrane of both ears  Right ear pain    ED Prescriptions    Medication Sig Dispense Auth. Provider   neomycin-polymyxin-hydrocortisone (CORTISPORIN) 3.5-10000-1 OTIC suspension Place 4 drops into the right ear 3 (three) times daily for 7 days. 4.2 mL Fontaine Kossman V, PA-C   fluticasone (FLONASE) 50 MCG/ACT nasal spray Place 2 sprays into both nostrils daily. 1 g Romell Wolden V, PA-C   ipratropium (ATROVENT) 0.06 % nasal spray Place 2 sprays into both nostrils 4 (four) times daily. 15 mL Miko Markwood V, PA-C   cetirizine (ZYRTEC ALLERGY) 10 MG tablet Take 1 tablet (10 mg total) by mouth daily. 30 tablet Threasa Alpha, [place] 01/19/19 1725

## 2019-01-19 NOTE — ED Notes (Signed)
Patient able to ambulate independently  

## 2019-01-19 NOTE — Discharge Instructions (Addendum)
Start cortisporin to cover for right outer ear infection. However, as discussed, pain most likely due to eustachian tube dysfunction with pressure. Start flonase, atrovent, zyrtec. This may take 1-2 weeks to start working, once pain resolves, continue use for another 1-2 weeks prior to stopping. If symptoms return, can restart nasal spray.

## 2019-04-04 ENCOUNTER — Ambulatory Visit
Admission: EM | Admit: 2019-04-04 | Discharge: 2019-04-04 | Disposition: A | Discharge status: Home / Self Care | Payer: Self-pay | Attending: Physician Assistant | Admitting: Physician Assistant

## 2019-04-04 ENCOUNTER — Other Ambulatory Visit: Payer: Self-pay

## 2019-04-04 DIAGNOSIS — H00014 Hordeolum externum left upper eyelid: Secondary | ICD-10-CM

## 2019-04-04 DIAGNOSIS — H01024 Squamous blepharitis left upper eyelid: Secondary | ICD-10-CM

## 2019-04-04 MED ORDER — ERYTHROMYCIN 5 MG/GM OP OINT: TOPICAL_OINTMENT | OPHTHALMIC | 0 refills | Status: DC

## 2019-04-04 NOTE — ED Provider Notes (Signed)
[facility]    CSN: [id number] Arrival date & time: 04/04/19  1145     History   Chief Complaint Chief Complaint  Patient presents with  . Eye Drainage    HPI Luis Beard is a 38 y.o. male.   38 year old male comes in for 2 week history of left upper eyelid swelling and drainage. Denies injury/trauma. States had eye drainage and crusting that is much improved. Has generalized blurry vision. Denies eye redness, photophobia. Denies erythema, warmth, fever. Denies contact lens, glasses use. Has tried warm compress with some relief.      Past Medical History:  Diagnosis Date  . Asthma     There are no active problems to display for this patient.   History reviewed. No pertinent surgical history.     Home Medications    Prior to Admission medications   Medication Sig Start Date End Date Taking? Authorizing Provider  cetirizine (ZYRTEC ALLERGY) 10 MG tablet Take 1 tablet (10 mg total) by mouth daily. 01/19/19   Tasia Catchings, Kenna Seward V, PA-C  erythromycin ophthalmic ointment Place a 1/2 inch ribbon of ointment into the upper eyelid 4 times a day for 7 days 04/04/19   Indiah Heyden V, PA-C  fluticasone (FLONASE) 50 MCG/ACT nasal spray Place 2 sprays into both nostrils daily. 01/19/19   Tasia Catchings, Evlyn Amason V, PA-C  ibuprofen (ADVIL,MOTRIN) 600 MG tablet Take 1 tablet (600 mg total) by mouth every 6 (six) hours as needed for moderate pain. 08/23/18   Julianne Rice, MD  ipratropium (ATROVENT) 0.06 % nasal spray Place 2 sprays into both nostrils 4 (four) times daily. 01/19/19   Ok Edwards, PA-C    Family History Family History  Problem Relation Age of Onset  . Hypertension Mother   . Diabetes Mother     Social History Social History   Tobacco Use  . Smoking status: Former Smoker    Packs/day: 1.00    Types: Cigars    Quit date: 04/15/2017    Years since quitting: 1.9  . Smokeless tobacco: Never Used  Substance Use Topics  . Alcohol use: Yes    Comment: occasionally  . Drug use:  Yes    Frequency: 7.0 times per week    Types: Marijuana     Allergies   Patient has no known allergies.   Review of Systems Review of Systems  Reason unable to perform ROS: See HPI as above.     Physical Exam Triage Vital Signs ED Triage Vitals  Enc Vitals Group     BP 04/04/19 1156 (!) 158/101     Pulse Rate 04/04/19 1156 64     Resp 04/04/19 1156 16     Temp 04/04/19 1156 98 F (36.7 C)     Temp Source 04/04/19 1156 Oral     SpO2 04/04/19 1156 94 %     Weight --      Height --      Head Circumference --      Peak Flow --      Pain Score 04/04/19 1157 7     Pain Loc --      Pain Edu? --      Excl. in [place]? --    No data found.  Updated Vital Signs BP (!) 158/101 (BP Location: Left Arm)   Pulse 64   Temp 98 F (36.7 C) (Oral)   Resp 16   SpO2 94%   Visual Acuity Right Eye Distance: 20/20 Left Eye Distance:  20/40 Bilateral Distance: 20/30  Right Eye Near:   Left Eye Near:    Bilateral Near:     Physical Exam Constitutional:      General: He is not in acute distress.    Appearance: He is well-developed.  HENT:     Head: Normocephalic and atraumatic.  Eyes:     Extraocular Movements: Extraocular movements intact.     Conjunctiva/sclera: Conjunctivae normal.     Pupils: Pupils are equal, round, and reactive to light.     Comments: Left upper eyelid with swelling. Hordeolum to the nasal aspect of eyelid. No signs of periorbital cellulitis.   Neck:     Musculoskeletal: Normal range of motion and neck supple.  Skin:    General: Skin is warm and dry.  Neurological:     Mental Status: He is alert and oriented to person, place, and time.      UC Treatments / Results  Labs (all labs ordered are listed, but only abnormal results are displayed) Labs Reviewed - No data to display  EKG   Radiology No results found.  Procedures Procedures (including critical care time)  Medications Ordered in UC Medications - No data to display  Initial  Impression / Assessment and Plan / UC Course  I have reviewed the triage vital signs and the nursing notes.  Pertinent labs & imaging results that were available during my care of the patient were reviewed by me and considered in my medical decision making (see chart for details).    Start erythromycin ointment, lid scrubs, warm compress.  Return precautions given.  Resources provided.  Patient expresses understanding and agrees to plan.  Final Clinical Impressions(s) / UC Diagnoses   Final diagnoses:  Squamous blepharitis of left upper eyelid  Hordeolum externum of left upper eyelid    ED Prescriptions    Medication Sig Dispense Auth. Provider   erythromycin ophthalmic ointment Place a 1/2 inch ribbon of ointment into the upper eyelid 4 times a day for 7 days 1 g Threasa AlphaYu, Lynisha Osuch V, PA-C        Madalyne Husk V, [place] 04/04/19 1326

## 2019-04-04 NOTE — ED Triage Notes (Signed)
Pt c/o lt eye swelling and drainage x2wks

## 2019-04-04 NOTE — Discharge Instructions (Signed)
Use erythromycin as directed. Lid scrubs and warm compresses as directed. Monitor for any worsening of symptoms, changes in vision, sensitivity to light, eye swelling, painful eye movement, follow up with ophthalmology for further evaluation.  ° °

## 2019-04-08 ENCOUNTER — Other Ambulatory Visit: Payer: Self-pay

## 2019-04-08 ENCOUNTER — Ambulatory Visit
Admission: EM | Admit: 2019-04-08 | Discharge: 2019-04-08 | Disposition: A | Discharge status: Home / Self Care | Payer: Self-pay | Attending: Physician Assistant | Admitting: Physician Assistant

## 2019-04-08 DIAGNOSIS — L03213 Periorbital cellulitis: Secondary | ICD-10-CM

## 2019-04-08 MED ORDER — CEPHALEXIN 500 MG PO CAPS: 500.0000 mg | ORAL_CAPSULE | Freq: Four times a day (QID) | ORAL | 0 refills | Status: DC

## 2019-04-08 MED ORDER — SULFAMETHOXAZOLE-TRIMETHOPRIM 800-160 MG PO TABS: 1.0000 | ORAL_TABLET | Freq: Two times a day (BID) | ORAL | 0 refills | Status: AC

## 2019-04-08 NOTE — Discharge Instructions (Signed)
Given continued swelling with pain. Start keflex and bactrim as directed to cover for left upper eyelid infection. Continue lid scrubs/warm compress. Follow up with ophthalmology on Monday for reevaluation. Go to the ED if sudden worsening of symptoms.

## 2019-04-08 NOTE — ED Triage Notes (Signed)
Pt presents for follow up for his eye. States that he was here 4 days ago and given antibiotic that he has been taken. States the swelling is better but his pain is worse in his left eye. Denies any decreased vision.

## 2019-04-08 NOTE — ED Provider Notes (Signed)
[facility]    CSN: [id number] Arrival date & time: 04/08/19  [id number]     History   Chief Complaint Chief Complaint  Patient presents with  . Eye Pain    HPI Luis Beard is a 38 y.o. male.   38 year old male returns to clinic for continued left eyelid pain after being seen 04/04/2019 for similar symptoms.  At that time, symptoms was thought to be blepharitis, and was told to use erythromycin ointment, lid scrubs, warm compress.  States swelling seems to have improved slightly, but eyelid pain has worsened, and very tender to touch.  Denies spreading erythema, warmth.  Denies fever, chills, night sweats.  Denies eye pain, eye redness, changes in vision, photophobia, discharge.     Past Medical History:  Diagnosis Date  . Asthma     There are no active problems to display for this patient.   History reviewed. No pertinent surgical history.     Home Medications    Prior to Admission medications   Medication Sig Start Date End Date Taking? Authorizing Provider  cetirizine (ZYRTEC ALLERGY) 10 MG tablet Take 1 tablet (10 mg total) by mouth daily. 01/19/19  Yes Shemicka Cohrs V, PA-C  erythromycin ophthalmic ointment Place a 1/2 inch ribbon of ointment into the upper eyelid 4 times a day for 7 days 04/04/19  Yes Shataria Crist V, PA-C  fluticasone (FLONASE) 50 MCG/ACT nasal spray Place 2 sprays into both nostrils daily. 01/19/19  Yes Datron Brakebill V, PA-C  ibuprofen (ADVIL,MOTRIN) 600 MG tablet Take 1 tablet (600 mg total) by mouth every 6 (six) hours as needed for moderate pain. 08/23/18  Yes Loren RacerYelverton, David, MD  ipratropium (ATROVENT) 0.06 % nasal spray Place 2 sprays into both nostrils 4 (four) times daily. 01/19/19  Yes Janeane Cozart V, PA-C  cephALEXin (KEFLEX) 500 MG capsule Take 1 capsule (500 mg total) by mouth 4 (four) times daily. 04/08/19   Cathie HoopsYu, Adalena Abdulla V, PA-C  sulfamethoxazole-trimethoprim (BACTRIM DS) 800-160 MG tablet Take 1 tablet by mouth 2 (two) times daily for 10 days. 04/08/19 04/18/19   Belinda FisherYu, Harlyn Italiano V, PA-C    Family History Family History  Problem Relation Age of Onset  . Hypertension Mother   . Diabetes Mother     Social History Social History   Tobacco Use  . Smoking status: Former Smoker    Packs/day: 1.00    Types: Cigars    Quit date: 04/15/2017    Years since quitting: 1.9  . Smokeless tobacco: Never Used  Substance Use Topics  . Alcohol use: Yes    Comment: occasionally  . Drug use: Yes    Frequency: 7.0 times per week    Types: Marijuana     Allergies   Patient has no known allergies.   Review of Systems Review of Systems  Reason unable to perform ROS: See HPI as above.     Physical Exam Triage Vital Signs ED Triage Vitals [04/08/19 1846]  Enc Vitals Group     BP      Pulse      Resp      Temp      Temp src      SpO2      Weight      Height      Head Circumference      Peak Flow      Pain Score 8     Pain Loc      Pain Edu?      Excl.  in [place]?    No data found.  Updated Vital Signs BP (!) 151/100   Pulse 67   Temp 97.9 F (36.6 C)   Resp 18   SpO2 98%   Visual Acuity Right Eye Distance:   Left Eye Distance:   Bilateral Distance:    Right Eye Near: R Near: 20/25 Left Eye Near:  L Near: 20/25 Bilateral Near:  20/20  Physical Exam Constitutional:      General: Luis Beard is not in acute distress.    Appearance: Luis Beard is well-developed. Luis Beard is not diaphoretic.  HENT:     Head: Normocephalic and atraumatic.  Eyes:     Extraocular Movements: Extraocular movements intact.     Conjunctiva/sclera: Conjunctivae normal.     Pupils: Pupils are equal, round, and reactive to light.     Comments: Left upper eyelid swelling with significant tender to touch. No obvious warmth. Slight induration noted. No fluctuance felt.   Pulmonary:     Effort: Pulmonary effort is normal. No respiratory distress.  Neurological:     Mental Status: Luis Beard is alert and oriented to person, place, and time.      UC Treatments / Results  Labs (all labs  ordered are listed, but only abnormal results are displayed) Labs Reviewed - No data to display  EKG   Radiology No results found.  Procedures Procedures (including critical care time)  Medications Ordered in UC Medications - No data to display  Initial Impression / Assessment and Plan / UC Course  I have reviewed the triage vital signs and the nursing notes.  Pertinent labs & imaging results that were available during my care of the patient were reviewed by me and considered in my medical decision making (see chart for details).    Given continued swelling with significant pain and slight induration to the left upper eyelid, will start Keflex and Bactrim to cover for preseptal cellulitis.  Continue symptomatic treatment.  Strict return precautions given.  Otherwise follow-up with ophthalmology on Monday for recheck.  Patient expresses understanding and agrees to plan.  Final Clinical Impressions(s) / UC Diagnoses   Final diagnoses:  Preseptal cellulitis of left eye    ED Prescriptions    Medication Sig Dispense Auth. Provider   cephALEXin (KEFLEX) 500 MG capsule Take 1 capsule (500 mg total) by mouth 4 (four) times daily. 40 capsule Joyanne Eddinger V, PA-C   sulfamethoxazole-trimethoprim (BACTRIM DS) 800-160 MG tablet Take 1 tablet by mouth 2 (two) times daily for 10 days. 20 tablet Tobin Chad, [place] 04/08/19 1923

## 2019-05-03 ENCOUNTER — Ambulatory Visit
Admission: EM | Admit: 2019-05-03 | Discharge: 2019-05-03 | Disposition: A | Discharge status: Home / Self Care | Payer: Self-pay | Attending: Physician Assistant | Admitting: Physician Assistant

## 2019-05-03 ENCOUNTER — Other Ambulatory Visit: Payer: Self-pay

## 2019-05-03 DIAGNOSIS — Z760 Encounter for issue of repeat prescription: Secondary | ICD-10-CM

## 2019-05-03 MED ORDER — ERYTHROMYCIN 5 MG/GM OP OINT: TOPICAL_OINTMENT | OPHTHALMIC | 0 refills | Status: DC

## 2019-05-03 NOTE — ED Provider Notes (Signed)
[facility]    CSN: [id number] Arrival date & time: 05/03/19  1254      History   Chief Complaint Chief Complaint  Patient presents with  . Medication Refill    HPI Luis Beard is a 38 y.o. male.   38 year old male comes in for medication refill.  He was first seen in office 04/04/2019 for hordeolum treated with erythromycin ointment.  He was seen again 04/08/2019 for possible preseptal cellulitis and was started on Keflex and Bactrim.  He followed up with ophthalmology 04/12/2019 and states had the stye "removed".  States he was told by ophthalmologist to continue medications he was already on, including erythromycin ointment.  He does not need follow-up with ophthalmology, but lost erythromycin ointment, and came in for refill.  He denies any worsening of symptoms, denies spreading erythema, warmth.  Denies pain with eye movement, photophobia, changes in vision.     Past Medical History:  Diagnosis Date  . Asthma     There are no active problems to display for this patient.   History reviewed. No pertinent surgical history.     Home Medications    Prior to Admission medications   Medication Sig Start Date End Date Taking? Authorizing Provider  cetirizine (ZYRTEC ALLERGY) 10 MG tablet Take 1 tablet (10 mg total) by mouth daily. 01/19/19   Tasia Catchings, Amy V, PA-C  erythromycin ophthalmic ointment Place a 1/2 inch ribbon of ointment into the lower eyelid for 4 times a day. 05/03/19   Tasia Catchings, Amy V, PA-C  fluticasone (FLONASE) 50 MCG/ACT nasal spray Place 2 sprays into both nostrils daily. 01/19/19   Tasia Catchings, Amy V, PA-C  ibuprofen (ADVIL,MOTRIN) 600 MG tablet Take 1 tablet (600 mg total) by mouth every 6 (six) hours as needed for moderate pain. 08/23/18   Julianne Rice, MD  ipratropium (ATROVENT) 0.06 % nasal spray Place 2 sprays into both nostrils 4 (four) times daily. 01/19/19   Ok Edwards, PA-C    Family History Family History  Problem Relation Age of Onset  . Hypertension  Mother   . Diabetes Mother     Social History Social History   Tobacco Use  . Smoking status: Former Smoker    Packs/day: 1.00    Types: Cigars    Quit date: 04/15/2017    Years since quitting: 2.0  . Smokeless tobacco: Never Used  Substance Use Topics  . Alcohol use: Yes    Comment: occasionally  . Drug use: Yes    Frequency: 7.0 times per week    Types: Marijuana     Allergies   Patient has no known allergies.   Review of Systems Review of Systems  Reason unable to perform ROS: See HPI as above.     Physical Exam Triage Vital Signs ED Triage Vitals  Enc Vitals Group     BP 05/03/19 1309 (!) 188/114     Pulse Rate 05/03/19 1305 (!) 51     Resp 05/03/19 1305 16     Temp 05/03/19 1305 98.4 F (36.9 C)     Temp Source 05/03/19 1305 Oral     SpO2 05/03/19 1305 96 %     Weight --      Height --      Head Circumference --      Peak Flow --      Pain Score 05/03/19 1307 4     Pain Loc --      Pain Edu? --  Excl. in [place]? --    No data found.  Updated Vital Signs BP (!) 188/114 (BP Location: Left Arm)   Pulse (!) 51   Temp 98.4 F (36.9 C) (Oral)   Resp 16   SpO2 96%   Physical Exam Constitutional:      General: He is not in acute distress.    Appearance: He is well-developed. He is not diaphoretic.  HENT:     Head: Normocephalic and atraumatic.  Eyes:     Extraocular Movements: Extraocular movements intact.     Conjunctiva/sclera: Conjunctivae normal.     Pupils: Pupils are equal, round, and reactive to light.     Comments: Slight swelling of the left upper eyelid compared to right.  No erythema or warmth.  No hordeolum/chalazion seen.  No tenderness to palpation.  Pulmonary:     Effort: Pulmonary effort is normal. No respiratory distress.  Neurological:     Mental Status: He is alert and oriented to person, place, and time.    UC Treatments / Results  Labs (all labs ordered are listed, but only abnormal results are displayed) Labs  Reviewed - No data to display  EKG   Radiology No results found.  Procedures Procedures (including critical care time)  Medications Ordered in UC Medications - No data to display  Initial Impression / Assessment and Plan / UC Course  I have reviewed the triage vital signs and the nursing notes.  Pertinent labs & imaging results that were available during my care of the patient were reviewed by me and considered in my medical decision making (see chart for details).    Discussed with patient, directions by ophthalmologist may have been to have patient finish oral antibiotics.  At this time will refill erythromycin ointment, and will have patient contact ophthalmology for further instructions.  Given recent procedure to the eyelid, discussed if any symptomatic changes, to follow-up with ophthalmology for further evaluation needed.  Patient expresses understanding and agrees to plan.  Final Clinical Impressions(s) / UC Diagnoses   Final diagnoses:  Medication refill   ED Prescriptions    Medication Sig Dispense Auth. Provider   erythromycin ophthalmic ointment Place a 1/2 inch ribbon of ointment into the lower eyelid for 4 times a day. 1 g Threasa AlphaYu, Amy V, PA-C        Yu, Amy V, [place]PA-[place] 05/03/19 1431

## 2019-05-03 NOTE — Discharge Instructions (Signed)
Continue erythromycin as directed. Follow up with Dr Katy Fitch as needed.

## 2019-05-03 NOTE — ED Triage Notes (Signed)
Pt presents to UC stating that he was seen here 3 weeks ago for stye on left eyelid and was prescribed cream. He states he lost the tube a week ago and would like another one. Left eyelid is slightly swollen at this time. Pt reports clear fluid comes out of stye occasionally. Pt also reports that he saw Dr. Katy Fitch who performed a procedure on eye to release stye 3 days after being seen at this urgent care.

## 2019-06-29 ENCOUNTER — Other Ambulatory Visit: Payer: Self-pay

## 2019-06-29 DIAGNOSIS — Z20822 Contact with and (suspected) exposure to covid-19: Secondary | ICD-10-CM

## 2019-06-30 LAB — NOVEL CORONAVIRUS, NAA: SARS-CoV-2, NAA: DETECTED — AB

## 2019-12-27 ENCOUNTER — Other Ambulatory Visit: Payer: Self-pay

## 2019-12-27 ENCOUNTER — Ambulatory Visit
Admission: EM | Admit: 2019-12-27 | Discharge: 2019-12-27 | Disposition: A | Discharge status: Home / Self Care | Payer: Self-pay | Attending: Physician Assistant | Admitting: Physician Assistant

## 2019-12-27 ENCOUNTER — Encounter: Payer: Self-pay | Admitting: Emergency Medicine

## 2019-12-27 DIAGNOSIS — R059 Cough, unspecified: Secondary | ICD-10-CM

## 2019-12-27 DIAGNOSIS — R05 Cough: Secondary | ICD-10-CM

## 2019-12-27 DIAGNOSIS — R519 Headache, unspecified: Secondary | ICD-10-CM

## 2019-12-27 DIAGNOSIS — R0981 Nasal congestion: Secondary | ICD-10-CM

## 2019-12-27 MED ORDER — PREDNISONE 50 MG PO TABS: 50.0000 mg | ORAL_TABLET | Freq: Every day | ORAL | 0 refills | Status: DC

## 2019-12-27 MED ORDER — FLUTICASONE PROPIONATE 50 MCG/ACT NA SUSP: 2.0000 | Freq: Every day | NASAL | 0 refills | Status: DC

## 2019-12-27 MED ORDER — IPRATROPIUM BROMIDE 0.06 % NA SOLN: 2.0000 | Freq: Four times a day (QID) | NASAL | 0 refills | Status: DC

## 2019-12-27 MED ORDER — ALBUTEROL SULFATE HFA 108 (90 BASE) MCG/ACT IN AERS: 1.0000 | INHALATION_SPRAY | Freq: Four times a day (QID) | RESPIRATORY_TRACT | 0 refills | Status: DC | PRN

## 2019-12-27 MED ORDER — AFRIN NASAL SPRAY 0.05 % NA SOLN: 1.0000 | Freq: Every evening | NASAL | 0 refills | Status: DC | PRN

## 2019-12-27 NOTE — Discharge Instructions (Signed)
COVID PCR testing ordered. I would like you to quarantine until testing results. Prednisone for sinus pressure/cough. Albtuerol as needed for cough/shortness of breath. Start flonase and atrovent nasal spray as directed. Afrin sparingly if needed at night. Do not use more than 3 days in a row. Tylenol/motrin for pain and fever. Keep hydrated, urine should be clear to pale yellow in color. If experiencing shortness of breath, trouble breathing, go to the emergency department for further evaluation needed.

## 2019-12-27 NOTE — ED Provider Notes (Signed)
[facility]    CSN: [id number] Arrival date & time: 12/27/19  1018      History   Chief Complaint Chief Complaint  Patient presents with  . Cough    HPI [name] is a 39 y.o. male.   39 year old male comes in for 4 day of URI symptoms. Has had nonproductive cough, rhinorrhea, headache. Denies fever, chills, body aches. Denies abdominal pain, nausea, vomiting, diarrhea. Denies shortness of breath, loss of taste/smell. Positive COVID 06/2019, symptoms completely resolved prior to current symptom onset. Former smoker.      Past Medical History:  Diagnosis Date  . Asthma     There are no problems to display for this patient.   History reviewed. No pertinent surgical history.     Home Medications    Prior to Admission medications   Medication Sig Start Date End Date Taking? Authorizing Provider  albuterol (VENTOLIN HFA) 108 (90 Base) MCG/ACT inhaler Inhale 1-2 puffs into the lungs every 6 (six) hours as needed for wheezing or shortness of breath. 12/27/19   Tasia Catchings, Amy V, PA-C  fluticasone (FLONASE) 50 MCG/ACT nasal spray Place 2 sprays into both nostrils daily. 12/27/19   Tasia Catchings, Amy V, PA-C  ipratropium (ATROVENT) 0.06 % nasal spray Place 2 sprays into both nostrils 4 (four) times daily. 12/27/19   Tasia Catchings, Amy V, PA-C  oxymetazoline (AFRIN NASAL SPRAY) 0.05 % nasal spray Place 1 spray into both nostrils at bedtime as needed for congestion. Do not use more than 3 days in a row 12/27/19   Tasia Catchings, Amy V, PA-C  predniSONE (DELTASONE) 50 MG tablet Take 1 tablet (50 mg total) by mouth daily with breakfast. 12/27/19   Tasia Catchings, Amy V, PA-C  cetirizine (ZYRTEC ALLERGY) 10 MG tablet Take 1 tablet (10 mg total) by mouth daily. Patient not taking: Reported on 12/27/2019 01/19/19 12/27/19  Arturo Morton    Family History Family History  Problem Relation Age of Onset  . Hypertension Mother   . Diabetes Mother     Social History Social History   Tobacco Use  . Smoking status:  Former Smoker    Packs/day: 1.00    Types: Cigars    Quit date: 04/15/2017    Years since quitting: 2.7  . Smokeless tobacco: Never Used  Substance Use Topics  . Alcohol use: Yes    Comment: occasionally  . Drug use: Yes    Frequency: 7.0 times per week    Types: Marijuana     Allergies   Patient has no known allergies.   Review of Systems Review of Systems  Reason unable to perform ROS: See HPI as above.     Physical Exam Triage Vital Signs ED Triage Vitals [12/27/19 1029]  Enc Vitals Group     BP (!) 166/122     Pulse Rate 68     Resp 18     Temp 97.6 F (36.4 C)     Temp Source Oral     SpO2 94 %     Weight      Height      Head Circumference      Peak Flow      Pain Score 6     Pain Loc      Pain Edu?      Excl. in [place]?    No data found.  Updated Vital Signs BP (!) 166/122 (BP Location: Left Arm)   Pulse 68   Temp 97.6 F (36.4 C) (  Oral)   Resp 18   SpO2 94%   Physical Exam Constitutional:      General: He is not in acute distress.    Appearance: Normal appearance. He is not ill-appearing, toxic-appearing or diaphoretic.  HENT:     Head: Normocephalic and atraumatic.     Right Ear: Ear canal and external ear normal. A middle ear effusion is present. Tympanic membrane is not erythematous or bulging.     Left Ear: Ear canal and external ear normal. A middle ear effusion is present. Tympanic membrane is not erythematous or bulging.     Nose: Congestion present. No rhinorrhea.     Right Sinus: Maxillary sinus tenderness and frontal sinus tenderness present.     Left Sinus: Maxillary sinus tenderness and frontal sinus tenderness present.     Mouth/Throat:     Mouth: Mucous membranes are moist.     Pharynx: Oropharynx is clear. Uvula midline.  Cardiovascular:     Rate and Rhythm: Normal rate and regular rhythm.     Heart sounds: Normal heart sounds. No murmur. No friction rub. No gallop.   Pulmonary:     Effort: Pulmonary effort is normal. No  accessory muscle usage, prolonged expiration, respiratory distress or retractions.     Comments: Lungs clear to auscultation without adventitious lung sounds. Musculoskeletal:     Cervical back: Normal range of motion and neck supple.  Neurological:     General: No focal deficit present.     Mental Status: He is alert and oriented to person, place, and time.     UC Treatments / Results  Labs (all labs ordered are listed, but only abnormal results are displayed) Labs Reviewed  NOVEL CORONAVIRUS, NAA    EKG   Radiology No results found.  Procedures Procedures (including critical care time)  Medications Ordered in UC Medications - No data to display  Initial Impression / Assessment and Plan / UC Course  I have reviewed the triage vital signs and the nursing notes.  Pertinent labs & imaging results that were available during my care of the patient were reviewed by me and considered in my medical decision making (see chart for details).    COVID PCR test ordered. Patient to quarantine until testing results return. No alarming signs on exam. LCTAB. Discussed possible reactive airway due to history of asthma/former smoker. Will start prednisone and albuterol. Other symptomatic treatment discussed.  Push fluids.  Return precautions given.  Patient expresses understanding and agrees to plan.  Final Clinical Impressions(s) / UC Diagnoses   Final diagnoses:  Nasal congestion  Cough  Sinus headache   ED Prescriptions    Medication Sig Dispense Auth. Provider   predniSONE (DELTASONE) 50 MG tablet Take 1 tablet (50 mg total) by mouth daily with breakfast. 5 tablet Yu, Amy V, PA-C   albuterol (VENTOLIN HFA) 108 (90 Base) MCG/ACT inhaler Inhale 1-2 puffs into the lungs every 6 (six) hours as needed for wheezing or shortness of breath. 8 g Yu, Amy V, PA-C   fluticasone (FLONASE) 50 MCG/ACT nasal spray Place 2 sprays into both nostrils daily. 1 g Yu, Amy V, PA-C   ipratropium  (ATROVENT) 0.06 % nasal spray Place 2 sprays into both nostrils 4 (four) times daily. 15 mL Yu, Amy V, PA-C   oxymetazoline (AFRIN NASAL SPRAY) 0.05 % nasal spray Place 1 spray into both nostrils at bedtime as needed for congestion. Do not use more than 3 days in a row 30 mL Cathie Hoops, Amy V,  PA-C     PDMP not reviewed this encounter.   Belinda Fisher, PA-C 12/27/19 1140

## 2019-12-27 NOTE — ED Triage Notes (Addendum)
Pt sts non productive cough and HA x 4 days; denies fever pt sts had covid in January of this year

## 2019-12-29 LAB — SPECIMEN STATUS REPORT

## 2019-12-29 LAB — SARS-COV-2, NAA 2 DAY TAT

## 2019-12-29 LAB — NOVEL CORONAVIRUS, NAA: SARS-CoV-2, NAA: NOT DETECTED

## 2020-06-05 ENCOUNTER — Other Ambulatory Visit: Payer: Self-pay

## 2020-06-05 ENCOUNTER — Ambulatory Visit
Admission: EM | Admit: 2020-06-05 | Discharge: 2020-06-05 | Disposition: A | Discharge status: Home / Self Care | Payer: Self-pay | Attending: Emergency Medicine | Admitting: Emergency Medicine

## 2020-06-05 DIAGNOSIS — J410 Simple chronic bronchitis: Secondary | ICD-10-CM

## 2020-06-05 DIAGNOSIS — Z20822 Contact with and (suspected) exposure to covid-19: Secondary | ICD-10-CM

## 2020-06-05 MED ORDER — PREDNISONE 50 MG PO TABS: 50.0000 mg | ORAL_TABLET | Freq: Every day | ORAL | 0 refills | Status: DC

## 2020-06-05 MED ORDER — ALBUTEROL SULFATE HFA 108 (90 BASE) MCG/ACT IN AERS: 1.0000 | INHALATION_SPRAY | Freq: Four times a day (QID) | RESPIRATORY_TRACT | 0 refills | Status: DC | PRN

## 2020-06-05 MED ORDER — DOXYCYCLINE HYCLATE 100 MG PO CAPS: 100.0000 mg | ORAL_CAPSULE | Freq: Two times a day (BID) | ORAL | 0 refills | Status: DC

## 2020-06-05 MED ORDER — BENZONATATE 100 MG PO CAPS: 100.0000 mg | ORAL_CAPSULE | Freq: Three times a day (TID) | ORAL | 0 refills | Status: DC

## 2020-06-05 NOTE — ED Provider Notes (Signed)
[facility]    CSN: [id number] Arrival date & time: 06/05/20  1908      History   Chief Complaint Chief Complaint  Patient presents with  . Nasal Congestion    HPI Luis Beard is a 39 y.o. male  Presenting for generalized headache, cough, congestion for the last month.  Worsened over the last few weeks.  Has tried Mucinex without relief.  States his ribs hurt from coughing.  Has tried nebulizer without relief.  Denies shortness of breath, labored breathing or chest pain.  No fever, known sick contacts.  Has not undergone Covid testing.  Is fully Covid vaccinated.  States sputum is mildly productive without hemoptysis.  Does have history of asthma, allergies, asthma.  Denies hospitalization for breathing issues in the last year.  Past Medical History:  Diagnosis Date  . Asthma     There are no problems to display for this patient.   History reviewed. No pertinent surgical history.     Home Medications    Prior to Admission medications   Medication Sig Start Date End Date Taking? Authorizing Provider  albuterol (VENTOLIN HFA) 108 (90 Base) MCG/ACT inhaler Inhale 1-2 puffs into the lungs every 6 (six) hours as needed for wheezing or shortness of breath. 06/05/20   Hall-Potvin, [place], PA-C  benzonatate (TESSALON) 100 MG capsule Take 1 capsule (100 mg total) by mouth every 8 (eight) hours. 06/05/20   Hall-Potvin, [place], PA-C  doxycycline (VIBRAMYCIN) 100 MG capsule Take 1 capsule (100 mg total) by mouth 2 (two) times daily. 06/05/20   Hall-Potvin, [place], PA-C  predniSONE (DELTASONE) 50 MG tablet Take 1 tablet (50 mg total) by mouth daily with breakfast. 06/05/20   Hall-Potvin, [place], PA-C  cetirizine (ZYRTEC ALLERGY) 10 MG tablet Take 1 tablet (10 mg total) by mouth daily. Patient not taking: Reported on 12/27/2019 01/19/19 12/27/19  Belinda Fisher, PA-C  fluticasone [facility]) 50 MCG/ACT nasal spray Place 2 sprays into both nostrils daily. 12/27/19 06/05/20  Cathie Hoops,  Amy V, PA-C  ipratropium (ATROVENT) 0.06 % nasal spray Place 2 sprays into both nostrils 4 (four) times daily. 12/27/19 06/05/20  Belinda Fisher, PA-C    Family History Family History  Problem Relation Age of Onset  . Hypertension Mother   . Diabetes Mother     Social History Social History   Tobacco Use  . Smoking status: Former Smoker    Packs/day: 1.00    Types: Cigars    Quit date: 04/15/2017    Years since quitting: 3.1  . Smokeless tobacco: Never Used  Substance Use Topics  . Alcohol use: Yes    Comment: occasionally  . Drug use: Yes    Frequency: 7.0 times per week    Types: Marijuana     Allergies   Patient has no known allergies.   Review of Systems As per HPI   Physical Exam Triage Vital Signs ED Triage Vitals  Enc Vitals Group     BP      Pulse      Resp      Temp      Temp src      SpO2      Weight      Height      Head Circumference      Peak Flow      Pain Score      Pain Loc      Pain Edu?      Excl. in GC?    No  data found.  Updated Vital Signs BP (!) 173/122   Pulse 70   Temp 98.3 F (36.8 C)   Resp 19   SpO2 96%   Visual Acuity Right Eye Distance:   Left Eye Distance:   Bilateral Distance:    Right Eye Near:   Left Eye Near:    Bilateral Near:     Physical Exam Constitutional:      General: He is not in acute distress.    Appearance: He is not toxic-appearing or diaphoretic.  HENT:     Head: Normocephalic and atraumatic.     Mouth/Throat:     Mouth: Mucous membranes are moist.     Pharynx: Oropharynx is clear.  Eyes:     General: No scleral icterus.    Conjunctiva/sclera: Conjunctivae normal.     Pupils: Pupils are equal, round, and reactive to light.  Neck:     Comments: Trachea midline, negative JVD Cardiovascular:     Rate and Rhythm: Normal rate and regular rhythm.  Pulmonary:     Effort: Pulmonary effort is normal. No respiratory distress.     Breath sounds: No wheezing.  Musculoskeletal:     Cervical  back: Neck supple. No tenderness.  Lymphadenopathy:     Cervical: No cervical adenopathy.  Skin:    Capillary Refill: Capillary refill takes less than 2 seconds.     Coloration: Skin is not jaundiced or pale.     Findings: No rash.  Neurological:     Mental Status: He is alert and oriented to person, place, and time.      UC Treatments / Results  Labs (all labs ordered are listed, but only abnormal results are displayed) Labs Reviewed  NOVEL CORONAVIRUS, NAA    EKG   Radiology No results found.  Procedures Procedures (including critical care time)  Medications Ordered in UC Medications - No data to display  Initial Impression / Assessment and Plan / UC Course  I have reviewed the triage vital signs and the nursing notes.  Pertinent labs & imaging results that were available during my care of the patient were reviewed by me and considered in my medical decision making (see chart for details).     Patient afebrile, nontoxic, with SpO2 95-98% at bedside.  No tachycardia, tachypnea, accessory muscle use.  Covid PCR pending.  Patient to quarantine until results are back.  We will treat supportively as outlined below.  Return precautions discussed, patient verbalized understanding and is agreeable to plan. Final Clinical Impressions(s) / UC Diagnoses   Final diagnoses:  Encounter for screening laboratory testing for COVID-19 virus  Simple chronic bronchitis [facility])     Discharge Instructions     Your COVID test is pending - it is important to quarantine / isolate at home until your results are back. If you test positive and would like further evaluation for persistent or worsening symptoms, you may schedule an E-visit or virtual (video) visit throughout the [facility] app or website.  PLEASE NOTE: If you develop severe chest pain or shortness of breath please go to the ER or call 9-1-1 for further evaluation --> DO NOT schedule electronic or virtual visits for  this. Please call our office for further guidance / recommendations as needed.  For information about the Covid vaccine, please visit [email or web address]    ED Prescriptions    Medication Sig Dispense Auth. Provider   albuterol (VENTOLIN HFA) 108 (90 Base) MCG/ACT inhaler Inhale 1-2 puffs into the lungs every 6 (  six) hours as needed for wheezing or shortness of breath. 18 g Hall-Potvin, [place], PA-C   predniSONE (DELTASONE) 50 MG tablet Take 1 tablet (50 mg total) by mouth daily with breakfast. 5 tablet Hall-Potvin, [place], PA-C   doxycycline (VIBRAMYCIN) 100 MG capsule Take 1 capsule (100 mg total) by mouth 2 (two) times daily. 10 capsule Hall-Potvin, [place], PA-C   benzonatate (TESSALON) 100 MG capsule Take 1 capsule (100 mg total) by mouth every 8 (eight) hours. 21 capsule Hall-Potvin, [place], PA-C     PDMP not reviewed this encounter.   Odette Fraction [place], [place] 06/05/20 1956

## 2020-06-05 NOTE — ED Notes (Signed)
Pulse ox 96% during pa assessment.

## 2020-06-05 NOTE — ED Notes (Signed)
pts oxygen level is ranging from 88%-93% on room air. [place] hall potvin pa notified.

## 2020-06-05 NOTE — Discharge Instructions (Addendum)
Your COVID test is pending - it is important to quarantine / isolate at home until your results are back. °If you test positive and would like further evaluation for persistent or worsening symptoms, you may schedule an E-visit or virtual (video) visit throughout the [facility] MyChart app or website. ° °PLEASE NOTE: If you develop severe chest pain or shortness of breath please go to the ER or call 9-1-1 for further evaluation --> DO NOT schedule electronic or virtual visits for this. °Please call our office for further guidance / recommendations as needed. ° °For information about the Covid vaccine, please visit [email or web address] °

## 2020-06-05 NOTE — ED Triage Notes (Addendum)
Pt presents with complaints of headache, cough, and congestion x 1 month. Reports his ribs hurt from coughing so much. Pt has not been tested for covid. mucinex helped for awhile but the symptoms have returned and worsened x 2 weeks. Pt has had a productive cough ongoing x 1 month. Pt is in no distress during intake.

## 2020-06-09 LAB — NOVEL CORONAVIRUS, NAA: SARS-CoV-2, NAA: NOT DETECTED

## 2021-01-10 ENCOUNTER — Institutional Professional Consult (permissible substitution): Payer: Self-pay | Admitting: Neurology

## 2021-02-05 ENCOUNTER — Encounter: Payer: Self-pay | Admitting: Neurology

## 2021-02-05 ENCOUNTER — Ambulatory Visit (INDEPENDENT_AMBULATORY_CARE_PROVIDER_SITE_OTHER): Payer: Self-pay | Admitting: Neurology

## 2021-02-05 VITALS — BP 156/94 | HR 71 | Ht 69.0 in | Wt 305.3 lb

## 2021-02-05 DIAGNOSIS — Z6841 Body Mass Index (BMI) 40.0 and over, adult: Secondary | ICD-10-CM

## 2021-02-05 DIAGNOSIS — R351 Nocturia: Secondary | ICD-10-CM

## 2021-02-05 DIAGNOSIS — G4733 Obstructive sleep apnea (adult) (pediatric): Secondary | ICD-10-CM

## 2021-02-05 DIAGNOSIS — Z820 Family history of epilepsy and other diseases of the nervous system: Secondary | ICD-10-CM

## 2021-02-05 DIAGNOSIS — E661 Drug-induced obesity: Secondary | ICD-10-CM

## 2021-02-05 NOTE — Patient Instructions (Addendum)
Thank you for choosing [facility] for your sleep related care! It was nice to meet you today! I appreciate that you entrust me with your sleep related healthcare concerns. I hope, I was able to address at least some of your concerns today, and that I can help you feel reassured and also get better.    Here is what we discussed today and what we came up with as our plan for you:   Please talk to your primary care physician about a referral to the medical weight loss clinic as you have had difficulty with weight loss.   Based on your symptoms and your exam I believe you are still at risk for obstructive sleep apnea (aka OSA), and I think we should proceed with a sleep study to determine whether you do or do not have OSA and how severe it is. Even, if you have mild OSA, I may want you to consider treatment with CPAP, as treatment of even borderline or mild sleep apnea can result and improvement of symptoms such as sleep disruption, daytime sleepiness, nighttime bathroom breaks, restless leg symptoms, improvement of headache syndromes, even improved mood disorder.   As explained, an attended sleep study meaning you get to stay overnight in the sleep lab, lets [place] monitor sleep-related behaviors such as sleep talking and leg movements in sleep, in addition to monitoring for sleep apnea.  A home sleep test is a screening tool for sleep apnea only, and unfortunately does not help with any other sleep-related diagnoses.  Please remember, the long-term risks and ramifications of untreated moderate to severe obstructive sleep apnea are: increased Cardiovascular disease, including congestive heart failure, stroke, difficult to control hypertension, treatment resistant obesity, arrhythmias, especially irregular heartbeat commonly known as A. Fib. (atrial fibrillation); even type 2 diabetes has been linked to untreated OSA.   Sleep apnea can cause disruption of sleep and sleep deprivation in most  cases, which, in turn, can cause recurrent headaches, problems with memory, mood, concentration, focus, and vigilance. Most people with untreated sleep apnea report excessive daytime sleepiness, which can affect their ability to drive. Please do not drive if you feel sleepy. Patients with sleep apnea can also develop difficulty initiating and maintaining sleep (aka insomnia).   Having sleep apnea may increase your risk for other sleep disorders, including involuntary behaviors sleep such as sleep terrors, sleep talking, sleepwalking.    Having sleep apnea can also increase your risk for restless leg syndrome and leg movements at night.   Please note that untreated obstructive sleep apnea may carry additional perioperative morbidity. Patients with significant obstructive sleep apnea (typically, in the moderate to severe degree) should receive, if possible, perioperative PAP (positive airway pressure) therapy and the surgeons and particularly the anesthesiologists should be informed of the diagnosis and the severity of the sleep disordered breathing.   I will likely see you back after your sleep study to go over the test results and where to go from there. We will call you after your sleep study to advise about the results (most likely, you will hear from [facility], my nurse) and to set up an appointment at the time, as necessary.    Our sleep lab administrative assistant will call you to schedule your sleep study and give you further instructions, regarding the check in process for the sleep study, arrival time, what to bring, when you can expect to leave after the study, etc., and to answer any other logistical questions you may have. If  you don't hear back from her by about 2 weeks from now, please feel free to call her direct line at [phone] or you can call our general clinic number, or email [place] through My Chart.

## 2021-02-05 NOTE — Progress Notes (Signed)
Subjective:    Patient ID: Luis Beard is a 40 y.o. male.  HPI     Huston Foley, MD, PhD [facility] [address], [address] [address] [place], [place] [address]  Dear Dr. Jenne Pane,   I saw your patient, Luis Beard, upon your kind request, in my Sleep clinic today for initial consultation of his sleep disorder, in particular, evaluation of his prior diagnosis of bstructive sleep apnea.  The patient is unaccompanied today.  As you know, [name] is a 40 year old right-handed gentleman with an underlying medical history of sinusitis, allergic rhinitis, asthma, and obesity, who was previously diagnosed with obstructive sleep apnea.  He had a split-night sleep study at [facility] on 04/09/2016 and I was able to review the report.  Baseline AHI was 35.9/h, O2 he was titrated on CPAP to a pressure of 15 cm.  Study was interpreted by Dr. Jetty Duhamel.  I reviewed your office note from 12/04/2020.  He has inferior turbinate hypertrophy.  Conservative treatment and surgical treatment were discussed.  He has not been on CPAP therapy.  His Epworth sleepiness score is 14 out of 24, fatigue severity score is 34 out of 63.  He is married and lives with his family including wife and 4 children, ages 72, 28, 31 and 54.  They do not have any pets in the household, he has a TV in his bedroom but it tends to stay on at night.  He goes to bed typically before midnight and rise time is around 7 AM.  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  He smokes cigars about 3/day, non-smoker of cigarettes, alcohol occasionally, maybe once a month and caffeine occasionally in the form of soda.  He has nocturia about 3-4 times per average night and denies recurrent morning headaches.  His sister has sleep apnea and has a CPAP machine.  He has had weight fluctuation, when he was placed on phentermine by his primary care physician, he was able to lose about 50 pounds  but after he stopped the phentermine he gained most of the weight back.    His Past Medical History Is Significant For: Past Medical History:  Diagnosis Date  . Asthma     His Past Surgical History Is Significant For: History reviewed. No pertinent surgical history.  His Family History Is Significant For: Family History  Problem Relation Age of Onset  . Hypertension Mother   . Diabetes Mother     His Social History Is Significant For: Social History   Socioeconomic History  . Marital status: Married    Spouse name: Not on file  . Number of children: Not on file  . Years of education: Not on file  . Highest education level: Not on file  Occupational History  . Not on file  Tobacco Use  . Smoking status: Former Smoker    Packs/day: 1.00    Types: Cigars    Quit date: 04/15/2017    Years since quitting: 3.8  . Smokeless tobacco: Never Used  Substance and Sexual Activity  . Alcohol use: Yes    Comment: occasionally  . Drug use: Yes    Frequency: 7.0 times per week    Types: Marijuana  . Sexual activity: Yes    Birth control/protection: Condom  Other Topics Concern  . Not on file  Social History Narrative  . Not on file   Social Determinants of Health   Financial Resource Strain:  Not on file  Food Insecurity: Not on file  Transportation Needs: Not on file  Physical Activity: Not on file  Stress: Not on file  Social Connections: Not on file    His Allergies Are:  No Known Allergies:   His Current Medications Are:  Outpatient Encounter Medications as of 02/05/2021  Medication Sig  . albuterol (VENTOLIN HFA) 108 (90 Base) MCG/ACT inhaler Inhale 1-2 puffs into the lungs every 6 (six) hours as needed for wheezing or shortness of breath.  . benzonatate (TESSALON) 100 MG capsule Take 1 capsule (100 mg total) by mouth every 8 (eight) hours.  Marland Kitchen doxycycline (VIBRAMYCIN) 100 MG capsule Take 1 capsule (100 mg total) by mouth 2 (two) times daily.  . predniSONE  (DELTASONE) 50 MG tablet Take 1 tablet (50 mg total) by mouth daily with breakfast.  . [DISCONTINUED] cetirizine (ZYRTEC ALLERGY) 10 MG tablet Take 1 tablet (10 mg total) by mouth daily. (Patient not taking: Reported on 12/27/2019)  . [DISCONTINUED] fluticasone (FLONASE) 50 MCG/ACT nasal spray Place 2 sprays into both nostrils daily.  . [DISCONTINUED] ipratropium (ATROVENT) 0.06 % nasal spray Place 2 sprays into both nostrils 4 (four) times daily.   No facility-administered encounter medications on file as of 02/05/2021.  :   Review of Systems:  Out of a complete 14 point review of systems, all are reviewed and negative with the exception of these symptoms as listed below:  Review of Systems  Neurological:       Here for sleep consult. Prior sleep study was in 2017, pt reports he was not started on CPAP. Pt reports he snores at night, but feels like he feels well rested.  Reports occasional headaches and frequent bathroom trips at night.  Epworth Sleepiness Scale 0= would never doze 1= slight chance of dozing 2= moderate chance of dozing 3= high chance of dozing  Sitting and reading:2 Watching TV:3 Sitting inactive in a public place (ex. Theater or meeting):1 As a passenger in a car for an hour without a break:3 Lying down to rest in the afternoon:3 Sitting and talking to someone:0 Sitting quietly after lunch (no alcohol):2 In a car, while stopped in traffic:0 Total:     Objective:  Neurological Exam  Physical Exam Physical Examination:   Vitals:   02/05/21 1326  BP: (!) 156/94  Pulse: 71  SpO2: 95%    General Examination: The patient is a very pleasant 40 y.o. male in no acute distress. He appears well-developed and well-nourished and well groomed.   HEENT: Normocephalic, atraumatic, pupils are equal, round and reactive to light, extraocular tracking is good without limitation to gaze excursion or nystagmus noted. Hearing is grossly intact. Face is symmetric with  normal facial animation. Speech is clear with no dysarthria noted. There is no hypophonia. There is no lip, neck/head, jaw or voice tremor. Neck is supple with full range of passive and active motion. There are no carotid bruits on auscultation. Oropharynx exam reveals: mild mouth dryness, good dental hygiene and marked airway crowding, due to small airway entry, thicker soft palate, larger uvula, tonsillar size of about 1-2+ bilaterally.  Mallampati class IV.  Neck circumference of 19-3/8 inches.  He has a minimal overbite.  Tongue protrudes centrally and palate elevates symmetrically.  Nasal inspection shows no significant deviated septum, he does have nasal congestion and inferior turbinate hypertrophy.  Chest: Clear to auscultation without wheezing, rhonchi or crackles noted.  Heart: S1+S2+0, regular and normal without murmurs, rubs or gallops noted.  Abdomen: Soft, non-tender and non-distended with normal bowel sounds appreciated on auscultation.  Extremities: There is no pitting edema in the distal lower extremities bilaterally.   Skin: Warm and dry without trophic changes noted.   Musculoskeletal: exam reveals no obvious joint deformities, tenderness or joint swelling or erythema.   Neurologically:  Mental status: The patient is awake, alert and oriented in all 4 spheres. His immediate and remote memory, attention, language skills and fund of knowledge are appropriate. There is no evidence of aphasia, agnosia, apraxia or anomia. Speech is clear with normal prosody and enunciation. Thought process is linear. Mood is normal and affect is normal.  Cranial nerves II - XII are as described above under HEENT exam.  Motor exam: Normal bulk, strength and tone is noted. There is no tremor, Romberg is negative. Fine motor skills and coordination: grossly intact.  Cerebellar testing: No dysmetria or intention tremor. There is no truncal or gait ataxia.  Sensory exam: intact to light touch in the  upper and lower extremities.  Gait, station and balance: He stands easily. No veering to one side is noted. No leaning to one side is noted. Posture is age-appropriate and stance is narrow based. Gait shows normal stride length and normal pace. No problems turning are noted. Tandem walk is unremarkable.                Assessment and Plan:   In summary, Broedy Osbourne is a very pleasant 41 y.o.-year old male  with an underlying medical history of sinusitis, allergic rhinitis, asthma, and obesity, whose history and physical exam are concerning for obstructive sleep apnea (OSA). I had a long chat with the patient about my findings and the diagnosis of OSA, its prognosis and treatment options. We talked about medical treatments, surgical interventions and non-pharmacological approaches. I explained in particular the risks and ramifications of untreated moderate to severe OSA, especially with respect to developing cardiovascular disease down the Road, including congestive heart failure, difficult to treat hypertension, cardiac arrhythmias, or stroke. Even type 2 diabetes has, in part, been linked to untreated OSA. Symptoms of untreated OSA include daytime sleepiness, memory problems, mood irritability and mood disorder such as depression and anxiety, lack of energy, as well as recurrent headaches, especially morning headaches. We talked about smoking cessation and trying to maintain a healthy lifestyle in general, as well as the importance of weight control. We also talked about the importance of good sleep hygiene. I recommended the following at this time: sleep study.   I explained the sleep test procedure to the patient and also outlined possible surgical and non-surgical treatment options of OSA, including the use of a custom-made dental device (which would require a referral to a specialist dentist or oral surgeon), upper airway surgical options, such as traditional UPPP or a novel less invasive surgical  option in the form of Inspire hypoglossal nerve stimulation (which would involve a referral to an ENT surgeon). I also explained the CPAP treatment option to the patient, who indicated that he would be willing to try CPAP if the need arises. I explained the importance of being compliant with PAP treatment, not only for insurance purposes but primarily to improve His symptoms, and for the patient's long term health benefit, including to reduce His cardiovascular risks. I answered all his questions today and the patient was in agreement. I plan to see him back after the sleep study is completed and encouraged him to call with any interim questions, concerns, problems or  updates.   Thank you very much for allowing me to participate in the care of this nice patient. If I can be of any further assistance to you please do not hesitate to call me at [phone].  Sincerely,   Star Age, MD, PhD

## 2021-02-06 ENCOUNTER — Ambulatory Visit (INDEPENDENT_AMBULATORY_CARE_PROVIDER_SITE_OTHER): Payer: Self-pay

## 2021-02-06 ENCOUNTER — Ambulatory Visit
Admission: EM | Admit: 2021-02-06 | Discharge: 2021-02-06 | Disposition: A | Discharge status: Home / Self Care | Payer: Self-pay | Attending: Student | Admitting: Student

## 2021-02-06 ENCOUNTER — Other Ambulatory Visit: Payer: Self-pay

## 2021-02-06 ENCOUNTER — Encounter (HOSPITAL_COMMUNITY): Payer: Self-pay

## 2021-02-06 ENCOUNTER — Emergency Department (HOSPITAL_COMMUNITY)
Admission: EM | Admit: 2021-02-06 | Discharge: 2021-02-06 | Disposition: A | Discharge status: Home / Self Care | Payer: Self-pay | Attending: Emergency Medicine | Admitting: Emergency Medicine

## 2021-02-06 DIAGNOSIS — R9431 Abnormal electrocardiogram [ECG] [EKG]: Secondary | ICD-10-CM

## 2021-02-06 DIAGNOSIS — R0602 Shortness of breath: Secondary | ICD-10-CM

## 2021-02-06 DIAGNOSIS — R030 Elevated blood-pressure reading, without diagnosis of hypertension: Secondary | ICD-10-CM

## 2021-02-06 DIAGNOSIS — J45909 Unspecified asthma, uncomplicated: Secondary | ICD-10-CM | POA: Insufficient documentation

## 2021-02-06 DIAGNOSIS — I160 Hypertensive urgency: Secondary | ICD-10-CM

## 2021-02-06 DIAGNOSIS — R059 Cough, unspecified: Secondary | ICD-10-CM

## 2021-02-06 DIAGNOSIS — Z87891 Personal history of nicotine dependence: Secondary | ICD-10-CM | POA: Insufficient documentation

## 2021-02-06 DIAGNOSIS — Z1152 Encounter for screening for COVID-19: Secondary | ICD-10-CM

## 2021-02-06 DIAGNOSIS — I10 Essential (primary) hypertension: Secondary | ICD-10-CM | POA: Insufficient documentation

## 2021-02-06 DIAGNOSIS — J4521 Mild intermittent asthma with (acute) exacerbation: Secondary | ICD-10-CM

## 2021-02-06 DIAGNOSIS — J209 Acute bronchitis, unspecified: Secondary | ICD-10-CM | POA: Insufficient documentation

## 2021-02-06 LAB — URINALYSIS, ROUTINE W REFLEX MICROSCOPIC
Bacteria, UA: NONE SEEN
Bilirubin Urine: NEGATIVE
Glucose, UA: NEGATIVE mg/dL
Hgb urine dipstick: NEGATIVE
Ketones, ur: NEGATIVE mg/dL
Leukocytes,Ua: NEGATIVE
Nitrite: NEGATIVE
Protein, ur: 100 mg/dL — AB
Specific Gravity, Urine: 1.021 (ref 1.005–1.030)
pH: 6 (ref 5.0–8.0)

## 2021-02-06 LAB — CBC WITH DIFFERENTIAL/PLATELET
Abs Immature Granulocytes: 0.05 10*3/uL (ref 0.00–0.07)
Basophils Absolute: 0.1 10*3/uL (ref 0.0–0.1)
Basophils Relative: 0 %
Eosinophils Absolute: 0 10*3/uL (ref 0.0–0.5)
Eosinophils Relative: 0 %
HCT: 52.9 % — ABNORMAL HIGH (ref 39.0–52.0)
Hemoglobin: 16 g/dL (ref 13.0–17.0)
Immature Granulocytes: 0 %
Lymphocytes Relative: 12 %
Lymphs Abs: 1.4 10*3/uL (ref 0.7–4.0)
MCH: 25.6 pg — ABNORMAL LOW (ref 26.0–34.0)
MCHC: 30.2 g/dL (ref 30.0–36.0)
MCV: 84.5 fL (ref 80.0–100.0)
Monocytes Absolute: 0.5 10*3/uL (ref 0.1–1.0)
Monocytes Relative: 5 %
Neutro Abs: 9.3 10*3/uL — ABNORMAL HIGH (ref 1.7–7.7)
Neutrophils Relative %: 83 %
Platelets: 380 10*3/uL (ref 150–400)
RBC: 6.26 MIL/uL — ABNORMAL HIGH (ref 4.22–5.81)
RDW: 13.9 % (ref 11.5–15.5)
WBC: 11.3 10*3/uL — ABNORMAL HIGH (ref 4.0–10.5)
nRBC: 0 % (ref 0.0–0.2)

## 2021-02-06 LAB — BASIC METABOLIC PANEL
Anion gap: 9 (ref 5–15)
BUN: 13 mg/dL (ref 6–20)
CO2: 27 mmol/L (ref 22–32)
Calcium: 9 mg/dL (ref 8.9–10.3)
Chloride: 101 mmol/L (ref 98–111)
Creatinine, Ser: 1.09 mg/dL (ref 0.61–1.24)
GFR, Estimated: >60 mL/min (ref >60)
Glucose, Bld: 103 mg/dL — ABNORMAL HIGH (ref 70–99)
Potassium: 4.1 mmol/L (ref 3.5–5.1)
Sodium: 137 mmol/L (ref 135–145)

## 2021-02-06 LAB — TROPONIN I (HIGH SENSITIVITY)
Troponin I (High Sensitivity): 23 ng/L — ABNORMAL HIGH (ref <18)
Troponin I (High Sensitivity): 22 ng/L — ABNORMAL HIGH (ref <18)

## 2021-02-06 MED ORDER — AMLODIPINE BESYLATE 5 MG PO TABS: 5.0000 mg | ORAL_TABLET | Freq: Every day | ORAL | 0 refills | Status: DC

## 2021-02-06 MED ORDER — BENZONATATE 100 MG PO CAPS: 100.0000 mg | ORAL_CAPSULE | Freq: Three times a day (TID) | ORAL | 0 refills | Status: DC

## 2021-02-06 MED ORDER — BENZONATATE 100 MG PO CAPS
200.0000 mg | ORAL_CAPSULE | Freq: Once | ORAL | Status: AC
  Administered 2021-02-06: 200 mg via ORAL
  Filled 2021-02-06: qty 2

## 2021-02-06 MED ORDER — AZITHROMYCIN 250 MG PO TABS: 250.0000 mg | ORAL_TABLET | Freq: Every day | ORAL | 0 refills | Status: DC

## 2021-02-06 NOTE — ED Provider Notes (Signed)
[facility] EMERGENCY DEPARTMENT Provider Note   CSN: [id number] Arrival date & time: 02/06/21  1109     History No chief complaint on file.   Luis Beard is a 40 y.o. male.  He has a history of asthma.  He said he has had a cough for a few days, productive of some green-yellow sputum.  Denies any fever.  Minimal sore throat which he thinks is from his coughing.  No chest pain or shortness of breath.  No headache body aches vomiting or diarrhea.  Has been using throat lozenges without improvement.  He is a smoker.  He is COVID vaccinated, not boosted.  He went to urgent care where they found his blood pressure to be elevated and an abnormal EKG and referred him here.  No prior cardiac history.  Has had blood pressure elevations in the past although is not on medication for it.  No blurry vision double vision numbness or weakness The history is provided by the patient.  Cough Cough characteristics:  Productive Sputum characteristics:  Green and yellow Severity:  Moderate Onset quality:  Gradual Timing:  Intermittent Progression:  Unchanged Chronicity:  New Smoker: yes   Relieved by:  Nothing Worsened by:  Nothing Ineffective treatments: cough drops. Associated symptoms: sore throat   Associated symptoms: no chest pain, no chills, no fever, no headaches, no myalgias, no rash, no rhinorrhea, no shortness of breath, no sinus congestion and no wheezing        Past Medical History:  Diagnosis Date  . Asthma     There are no problems to display for this patient.   History reviewed. No pertinent surgical history.     Family History  Problem Relation Age of Onset  . Hypertension Mother   . Diabetes Mother     Social History   Tobacco Use  . Smoking status: Former Smoker    Packs/day: 1.00    Types: Cigars    Quit date: 04/15/2017    Years since quitting: 3.8  . Smokeless tobacco: Never Used  Substance Use Topics  . Alcohol use: Yes    Comment:  occasionally  . Drug use: Yes    Frequency: 7.0 times per week    Types: Marijuana    Home Medications Prior to Admission medications   Medication Sig Start Date End Date Taking? Authorizing Provider  albuterol (VENTOLIN HFA) 108 (90 Base) MCG/ACT inhaler Inhale 1-2 puffs into the lungs every 6 (six) hours as needed for wheezing or shortness of breath. 06/05/20   Hall-Potvin, [place], PA-C  benzonatate (TESSALON) 100 MG capsule Take 1 capsule (100 mg total) by mouth every 8 (eight) hours. 06/05/20   Hall-Potvin, [place], PA-C  doxycycline (VIBRAMYCIN) 100 MG capsule Take 1 capsule (100 mg total) by mouth 2 (two) times daily. 06/05/20   Hall-Potvin, [place], PA-C  predniSONE (DELTASONE) 50 MG tablet Take 1 tablet (50 mg total) by mouth daily with breakfast. 06/05/20   Hall-Potvin, [place], PA-C  cetirizine (ZYRTEC ALLERGY) 10 MG tablet Take 1 tablet (10 mg total) by mouth daily. Patient not taking: Reported on 12/27/2019 01/19/19 12/27/19  Belinda Fisher, PA-C  fluticasone [facility]) 50 MCG/ACT nasal spray Place 2 sprays into both nostrils daily. 12/27/19 06/05/20  Cathie Hoops, Amy V, PA-C  ipratropium (ATROVENT) 0.06 % nasal spray Place 2 sprays into both nostrils 4 (four) times daily. 12/27/19 06/05/20  Belinda Fisher, PA-C    Allergies    Patient has no known allergies.  Review of Systems  Review of Systems  Constitutional: Negative for chills and fever.  HENT: Positive for sore throat. Negative for rhinorrhea.   Eyes: Negative for visual disturbance.  Respiratory: Positive for cough. Negative for shortness of breath and wheezing.   Cardiovascular: Negative for chest pain.  Gastrointestinal: Negative for abdominal pain, diarrhea, nausea and vomiting.  Genitourinary: Negative for dysuria.  Musculoskeletal: Negative for myalgias.  Skin: Negative for rash.  Neurological: Negative for weakness, numbness and headaches.    Physical Exam Updated Vital Signs BP (!) 189/113 (BP Location: Left Arm)   Pulse 94    Temp 98.4 F (36.9 C)   Resp 20   SpO2 96%   Physical Exam Vitals and nursing note reviewed.  Constitutional:      Appearance: Normal appearance. He is well-developed.  HENT:     Head: Normocephalic and atraumatic.  Eyes:     Conjunctiva/sclera: Conjunctivae normal.  Cardiovascular:     Rate and Rhythm: Normal rate and regular rhythm.     Pulses: Normal pulses.     Heart sounds: No murmur heard.   Pulmonary:     Effort: Pulmonary effort is normal. No respiratory distress.     Breath sounds: Normal breath sounds.  Abdominal:     Palpations: Abdomen is soft.     Tenderness: There is no abdominal tenderness. There is no guarding or rebound.  Musculoskeletal:        General: No deformity or signs of injury. Normal range of motion.     Cervical back: Neck supple.     Right lower leg: No edema.     Left lower leg: No edema.  Skin:    General: Skin is warm and dry.     Capillary Refill: Capillary refill takes less than 2 seconds.  Neurological:     General: No focal deficit present.     Mental Status: He is alert and oriented to person, place, and time.     Cranial Nerves: No cranial nerve deficit.     Sensory: No sensory deficit.     Motor: No weakness.     ED Results / Procedures / Treatments   Labs (all labs ordered are listed, but only abnormal results are displayed) Labs Reviewed  BASIC METABOLIC PANEL - Abnormal; Notable for the following components:      Result Value   Glucose, Bld 103 (*)    All other components within normal limits  CBC WITH DIFFERENTIAL/PLATELET - Abnormal; Notable for the following components:   WBC 11.3 (*)    RBC 6.26 (*)    HCT 52.9 (*)    MCH 25.6 (*)    Neutro Abs 9.3 (*)    All other components within normal limits  URINALYSIS, ROUTINE W REFLEX MICROSCOPIC - Abnormal; Notable for the following components:   Protein, ur 100 (*)    All other components within normal limits  TROPONIN I (HIGH SENSITIVITY) - Abnormal; Notable for the  following components:   Troponin I (High Sensitivity) 23 (*)    All other components within normal limits  TROPONIN I (HIGH SENSITIVITY) - Abnormal; Notable for the following components:   Troponin I (High Sensitivity) 22 (*)    All other components within normal limits  RESP PANEL BY RT-PCR (FLU A&B, COVID) [id number]    EKG EKG Interpretation  Date/Time:  Wednesday February 06 2021 11:49:05 EDT Ventricular Rate:  84 PR Interval:  144 QRS Duration: 78 QT Interval:  358 QTC Calculation: 423 R Axis:   45 Text  Interpretation: Normal sinus rhythm ST & T wave abnormality, consider inferior ischemia Abnormal ECG nonspecific ST,Ts new from prior 5/16 Confirmed by Meridee Score [phone]) on 02/06/2021 1:58:31 PM   Radiology DG Chest 2 View  Result Date: 02/06/2021 CLINICAL DATA:  Cough and congestion. EXAM: CHEST - 2 VIEW COMPARISON:  08/23/2018. FINDINGS: Cardiomegaly. Low lung volumes mild bilateral interstitial prominence. Mild interstitial edema and/or pneumonitis cannot be excluded. No pleural effusion or pneumothorax. No acute bony abnormality. IMPRESSION: 1.  Cardiomegaly. 2. Low lung volumes with mild bilateral interstitial prominence. Mild interstitial edema and/or pneumonitis cannot be excluded. Electronically Signed   By: Maisie Fus  Register   On: 02/06/2021 09:50    Procedures Procedures   Medications Ordered in ED Medications  benzonatate (TESSALON) capsule 200 mg (200 mg Oral Given 02/06/21 1541)    ED Course  I have reviewed the triage vital signs and the nursing notes.  Pertinent labs & imaging results that were available during my care of the patient were reviewed by me and considered in my medical decision making (see chart for details).    MDM Rules/Calculators/A&P                         Luis Beard was evaluated in Emergency Department on 02/06/2021 for the symptoms described in the history of present illness. He was evaluated in the context of the global COVID-19  pandemic, which necessitated consideration that the patient might be at risk for infection with the SARS-CoV-2 virus that causes COVID-19. Institutional protocols and algorithms that pertain to the evaluation of patients at risk for COVID-19 are in a state of rapid change based on information released by regulatory bodies including the CDC and federal and state organizations. These policies and algorithms were followed during the patient's care in the ED.  This patient complains of cough and sore throat; this involves an extensive number of treatment Options and is a complaint that carries with it a high risk of complications and Morbidity. The differential includes COVID, pneumonia, bronchitis, hypertension, hypertensive urgency, ACS, metabolic derangement  I ordered, reviewed and interpreted labs, which included CBC O'Malley elevated white count normal hemoglobin, chemistries normal, troponins mildly elevated but flat, urinalysis unremarkable other than some proteinuria I ordered medication Tessalon Perles for cough Patient had a chest x-ray at urgent care and I reviewed the report.  No infiltrates.  Previous records obtained and reviewed in epic, all of his blood pressures seem to be elevated  After the interventions stated above, I reevaluated the patient and found patient to have a cough but otherwise is fairly asymptomatic.  Blood pressures remain elevated.  His care is signed out to oncoming provider Dr. Clarene Duke to follow-up on delta troponin.  Anticipate if not significantly rising he can be discharged.  Have written scripts for Zithromax and Tessalon Perles.  I recommended the patient contact his primary care doctor for close follow-up regarding his blood pressure.  Also counseled on stopping smoking.  Final Clinical Impression(s) / ED Diagnoses Final diagnoses:  Acute bronchitis, unspecified organism  Primary hypertension    Rx / DC Orders ED Discharge Orders         Ordered     azithromycin (ZITHROMAX Z-PAK) 250 MG tablet  Daily        02/06/21 1500    benzonatate (TESSALON) 100 MG capsule  Every 8 hours        02/06/21 1500    amLODipine (NORVASC) 5 MG tablet  Daily  02/06/21 1701           Terrilee Files, MD 02/06/21 2034

## 2021-02-06 NOTE — ED Triage Notes (Signed)
Pt present headache with coughing and congestion. Symptoms started last Saturday.  Pt states that his mucous is thick green and whitish looking.

## 2021-02-06 NOTE — Discharge Instructions (Addendum)
-  Have your friend or family member drive you straight to the ED ([facility] or [facility]) for further evaluation of your shortness of breath and dizziness. The EKG today showed some changes that indicate your heart is struggling for oxygen. This can indicate a cardiac event like a heart attack. This requires evaluation and management in the ED. -If your symptoms get worse on the way, like chest pain, shortness of breath, dizziness, weakness- stop and call 911 immediately. -Please check your blood pressure at home or at the pharmacy. If this continues to be >140/90, follow-up with your primary care provider for further blood pressure management/ medication titration. If you develop chest pain, shortness of breath, vision changes, the worst headache of your life- head straight to the ED or call 911.

## 2021-02-06 NOTE — ED Provider Notes (Signed)
I received this patient in signout from Dr. Charm Barges.  Briefly, patient had presented with cough and URI symptoms, seen at urgent care where he was tested for COVID but sent here due to elevated blood pressure and abnormal EKG.  Dr. Charm Barges had obtained initial troponin of 23 and felt that EKG changes did not represent ACS.  At time of signout, awaiting second troponin and plan for discharge if negative and patient still well-appearing.  Dr. Charm Barges had sent azithromycin and Tessalon to patient's pharmacy.  Repeat troponin reassuring at 22.  Patient is well-appearing and breathing comfortably on recheck.  Because his blood pressure has been persistently elevated here, will initiate antihypertensives with amlodipine but I emphasized the importance of close follow-up with PCP as he may need further blood pressure management.  Reviewed supportive measures for his cold symptoms and encouraged to avoid cough suppressants which may drive up his blood pressure.  Return precautions reviewed regarding signs of hypertensive emergency and he voiced understanding.   Salayah Meares, Ambrose [place], MD 02/06/21 [phone]

## 2021-02-06 NOTE — Discharge Instructions (Signed)
You were seen in the emergency department for cough, elevated blood pressure, and an abnormal EKG from urgent care.  You had blood work EKG here that did not show any evidence of heart attack.  Your blood pressure is elevated and this will need to be followed up with your primary care doctor.  We are putting you on some antibiotics and cough medicine for a probable bronchitis.  Return to the emergency department for any worsening or concerning symptoms.  Please consider stopping smoking

## 2021-02-06 NOTE — ED Notes (Signed)
Discharging patient for primary RN

## 2021-02-06 NOTE — ED Provider Notes (Signed)
Emergency Medicine Provider Triage Evaluation Note  Luis Beard , a 40 y.o. male  was evaluated in triage.  Pt complains of cough, congestion, HA for the past 4-5 days. Seen at [facility] today with elevated BP readings; no hx of HTN and not on meds. Also had an EKG done which showed some ST elevated and T wave changes; unable to see EKG from UC. Sent with concern for hypertensive urgency. Pt denies chest pain or SOB. No cardiac history.   Review of Systems  Positive: + cough, congestion Negative: - chest pain, SOB  Physical Exam  BP (!) 189/113 (BP Location: Left Arm)   Pulse 94   Temp 98.4 F (36.9 C)   Resp 20   SpO2 96%  Gen:   Awake, no distress   Resp:  Normal effort  MSK:   Moves extremities without difficulty  Other:    Medical Decision Making  Medically screening exam initiated at 11:37 AM.  Appropriate orders placed.  Luis Beard was informed that the remainder of the evaluation will be completed by another provider, this initial triage assessment does not replace that evaluation, and the importance of remaining in the ED until their evaluation is complete.     Tanda Rockers, PA-C 02/06/21 1139    Cathren Laine, MD 02/08/21 [phone]

## 2021-02-06 NOTE — ED Provider Notes (Signed)
[facility]    CSN: [id number] Arrival date & time: 02/06/21  0905      History   Chief Complaint Chief Complaint  Patient presents with  . Cough  . Headache    HPI Luis Beard is a 40 y.o. male presenting with shortness of breath, cough and headaches x4 days. He is an asthmatic.  Shortness of breath on exertion.  Coughing up white sputum.  Throbbing headaches behind forehead.  Has not taken any medications for his symptoms.  Denies history of heart disease.  Family history of early heart failure in sister.  Denies chest pain.  He is an asthmatic, has not used his albuterol inhaler in 3 days. Denies worst headache of life, thunderclap headache, weakness/sensation changes in arms/legs, vision changes, chest pain/pressure, photophobia, phonophobia, n/v/d. Denies fevers/chills, n/v/d, chest pain, facial pain, teeth pain, headaches, sore throat, loss of taste/smell, swollen lymph nodes, ear pain. Former smoker, current marijuana smoker.    HPI  Past Medical History:  Diagnosis Date  . Asthma     There are no problems to display for this patient.   History reviewed. No pertinent surgical history.     Home Medications    Prior to Admission medications   Medication Sig Start Date End Date Taking? Authorizing Provider  albuterol (VENTOLIN HFA) 108 (90 Base) MCG/ACT inhaler Inhale 1-2 puffs into the lungs every 6 (six) hours as needed for wheezing or shortness of breath. 06/05/20   Hall-Potvin, [place], PA-C  benzonatate (TESSALON) 100 MG capsule Take 1 capsule (100 mg total) by mouth every 8 (eight) hours. 06/05/20   Hall-Potvin, [place], PA-C  doxycycline (VIBRAMYCIN) 100 MG capsule Take 1 capsule (100 mg total) by mouth 2 (two) times daily. 06/05/20   Hall-Potvin, [place], PA-C  predniSONE (DELTASONE) 50 MG tablet Take 1 tablet (50 mg total) by mouth daily with breakfast. 06/05/20   Hall-Potvin, [place], PA-C  cetirizine (ZYRTEC ALLERGY) 10 MG tablet Take 1  tablet (10 mg total) by mouth daily. Patient not taking: Reported on 12/27/2019 01/19/19 12/27/19  Belinda Fisher, PA-C  fluticasone [facility]) 50 MCG/ACT nasal spray Place 2 sprays into both nostrils daily. 12/27/19 06/05/20  Cathie Hoops, Amy V, PA-C  ipratropium (ATROVENT) 0.06 % nasal spray Place 2 sprays into both nostrils 4 (four) times daily. 12/27/19 06/05/20  Belinda Fisher, PA-C    Family History Family History  Problem Relation Age of Onset  . Hypertension Mother   . Diabetes Mother     Social History Social History   Tobacco Use  . Smoking status: Former Smoker    Packs/day: 1.00    Types: Cigars    Quit date: 04/15/2017    Years since quitting: 3.8  . Smokeless tobacco: Never Used  Substance Use Topics  . Alcohol use: Yes    Comment: occasionally  . Drug use: Yes    Frequency: 7.0 times per week    Types: Marijuana     Allergies   Patient has no known allergies.   Review of Systems Review of Systems  Constitutional: Negative for appetite change, chills and fever.  HENT: Positive for congestion. Negative for ear pain, rhinorrhea, sinus pressure, sinus pain and sore throat.   Eyes: Negative for redness and visual disturbance.  Respiratory: Positive for shortness of breath. Negative for cough, chest tightness and wheezing.   Cardiovascular: Negative for chest pain and palpitations.  Gastrointestinal: Negative for abdominal pain, constipation, diarrhea, nausea and vomiting.  Genitourinary: Negative for dysuria, frequency and urgency.  Musculoskeletal: Negative for myalgias.  Neurological: Positive for dizziness and headaches. Negative for weakness.  Psychiatric/Behavioral: Negative for confusion.  All other systems reviewed and are negative.    Physical Exam Triage Vital Signs ED Triage Vitals  Enc Vitals Group     BP 02/06/21 0921 (!) 190/107     Pulse Rate 02/06/21 0921 89     Resp 02/06/21 0921 18     Temp 02/06/21 0921 98 F (36.7 C)     Temp Source 02/06/21 0921 Oral      SpO2 02/06/21 0921 93 %     Weight --      Height --      Head Circumference --      Peak Flow --      Pain Score 02/06/21 0920 0     Pain Loc --      Pain Edu? --      Excl. in GC? --    No data found.  Updated Vital Signs BP (!) 190/107 (BP Location: Left Arm)   Pulse 89   Temp 98 F (36.7 C) (Oral)   Resp 18   SpO2 93%   Visual Acuity Right Eye Distance:   Left Eye Distance:   Bilateral Distance:    Right Eye Near:   Left Eye Near:    Bilateral Near:     Physical Exam Vitals reviewed.  Constitutional:      General: He is not in acute distress.    Appearance: Normal appearance. He is not ill-appearing or diaphoretic.  HENT:     Head: Normocephalic and atraumatic.     Right Ear: Hearing, tympanic membrane, ear canal and external ear normal. No swelling or tenderness. There is no impacted cerumen. No mastoid tenderness. Tympanic membrane is not perforated, erythematous, retracted or bulging.     Left Ear: Hearing, tympanic membrane, ear canal and external ear normal. No swelling or tenderness. There is no impacted cerumen. No mastoid tenderness. Tympanic membrane is not perforated, erythematous, retracted or bulging.     Nose:     Right Sinus: No maxillary sinus tenderness or frontal sinus tenderness.     Left Sinus: No maxillary sinus tenderness or frontal sinus tenderness.     Mouth/Throat:     Mouth: Mucous membranes are moist.     Pharynx: Uvula midline. No oropharyngeal exudate or posterior oropharyngeal erythema.     Tonsils: No tonsillar exudate.  Eyes:     Extraocular Movements: Extraocular movements intact.     Pupils: Pupils are equal, round, and reactive to light.  Cardiovascular:     Rate and Rhythm: Normal rate and regular rhythm.     Pulses:          Radial pulses are 2+ on the right side and 2+ on the left side.     Heart sounds: Normal heart sounds.  Pulmonary:     Effort: Pulmonary effort is normal.     Breath sounds: Normal air entry.  Decreased breath sounds present. No wheezing, rhonchi or rales.     Comments: Decreased breath sounds throughout Chest:     Chest wall: No tenderness.  Abdominal:     General: Abdomen is flat. Bowel sounds are normal.     Palpations: Abdomen is soft.     Tenderness: There is no abdominal tenderness. There is no guarding or rebound.  Musculoskeletal:     Right lower leg: No edema.     Left lower leg: No edema.  Lymphadenopathy:  Cervical: No cervical adenopathy.  Skin:    General: Skin is warm.     Capillary Refill: Capillary refill takes less than 2 seconds.  Neurological:     General: No focal deficit present.     Mental Status: He is alert and oriented to person, place, and time.  Psychiatric:        Attention and Perception: Attention and perception normal.        Mood and Affect: Mood and affect normal.        Behavior: Behavior normal. Behavior is cooperative.        Thought Content: Thought content normal.        Judgment: Judgment normal.      UC Treatments / Results  Labs (all labs ordered are listed, but only abnormal results are displayed) Labs Reviewed  NOVEL CORONAVIRUS, NAA    EKG   Radiology DG Chest 2 View  Result Date: 02/06/2021 CLINICAL DATA:  Cough and congestion. EXAM: CHEST - 2 VIEW COMPARISON:  08/23/2018. FINDINGS: Cardiomegaly. Low lung volumes mild bilateral interstitial prominence. Mild interstitial edema and/or pneumonitis cannot be excluded. No pleural effusion or pneumothorax. No acute bony abnormality. IMPRESSION: 1.  Cardiomegaly. 2. Low lung volumes with mild bilateral interstitial prominence. Mild interstitial edema and/or pneumonitis cannot be excluded. Electronically Signed   By: Maisie Fus  Register   On: 02/06/2021 09:50    Procedures Procedures (including critical care time)  Medications Ordered in UC Medications - No data to display  Initial Impression / Assessment and Plan / UC Course  I have reviewed the triage vital signs and  the nursing notes.  Pertinent labs & imaging results that were available during my care of the patient were reviewed by me and considered in my medical decision making (see chart for details).     This patient is a 40 year old male presenting with shortness of breath, dizziness and URI symptoms. Today this pt is afebrile nontachycardic nontachypneic. Oxygenating at 93% on room air. Has not used his inhaler in 4 days.  CXR- 1.  Cardiomegaly. 2. Low lung volumes with mild bilateral interstitial prominence. Mild interstitial edema and/or pneumonitis cannot be excluded.  EKG with ST elevations and t-wave changes, changed from 2016 EKG. Also reviewed by Dr. Tracie Harrier.   Patient declines transport via EMS multiple times. He understands he may be having an MI. States his friend will drive him straight to the ED.  Covid PCR sent.   Discussed this patient with attending physician Dr. Tracie Harrier who is in agreement with this treatment plan.    Final Clinical Impressions(s) / UC Diagnoses   Final diagnoses:  Encounter for screening for COVID-19  Elevated blood pressure reading without diagnosis of hypertension     Discharge Instructions       -Please check your blood pressure at home or at the pharmacy. If this continues to be >140/90, follow-up with your primary care provider for further blood pressure management/ medication titration. If you develop chest pain, shortness of breath, vision changes, the worst headache of your life- head straight to the ED or call 911.    ED Prescriptions    None     PDMP not reviewed this encounter.   Rhys Martini, PA-C 02/06/21 1041

## 2021-02-06 NOTE — ED Triage Notes (Signed)
Patient complains of cough and congestion x 2 days. Patient denies fever, denies chills. Had ekg and covid test at [facility]. No hx of HTN

## 2021-02-07 LAB — SARS-COV-2, NAA 2 DAY TAT

## 2021-02-07 LAB — NOVEL CORONAVIRUS, NAA: SARS-CoV-2, NAA: NOT DETECTED

## 2021-02-11 ENCOUNTER — Telehealth: Payer: Self-pay

## 2021-02-11 NOTE — Telephone Encounter (Signed)
Called pt to schedule sleep study. No answer, unable to lvm due to voicemail not set up.

## 2021-02-25 ENCOUNTER — Telehealth: Payer: Self-pay

## 2021-02-25 NOTE — Telephone Encounter (Signed)
Pt will call back after he talks with his wife about when is the best time to schedule home sleep test due to being uninsured.

## 2021-02-27 ENCOUNTER — Telehealth: Payer: Self-pay

## 2021-02-27 NOTE — Telephone Encounter (Signed)
Pt has been called twice now to schedule sleep study. On 6/27: pt stated he will call back when ready to schedule

## 2021-09-25 ENCOUNTER — Other Ambulatory Visit: Payer: Self-pay | Admitting: Internal Medicine

## 2021-09-26 LAB — CBC
HCT: 53.4 % — ABNORMAL HIGH (ref 38.5–50.0)
Hemoglobin: 16.6 g/dL (ref 13.2–17.1)
MCH: 24.1 pg — ABNORMAL LOW (ref 27.0–33.0)
MCHC: 31.1 g/dL — ABNORMAL LOW (ref 32.0–36.0)
MCV: 77.5 fL — ABNORMAL LOW (ref 80.0–100.0)
MPV: 11 fL (ref 7.5–12.5)
Platelets: 275 10*3/uL (ref 140–400)
RBC: 6.89 10*6/uL — ABNORMAL HIGH (ref 4.20–5.80)
RDW: 16.6 % — ABNORMAL HIGH (ref 11.0–15.0)
WBC: 8.9 10*3/uL (ref 3.8–10.8)

## 2021-09-26 LAB — COMPLETE METABOLIC PANEL WITH GFR
AG Ratio: 1.5 (calc) (ref 1.0–2.5)
ALT: 15 U/L (ref 9–46)
AST: 14 U/L (ref 10–40)
Albumin: 4.3 g/dL (ref 3.6–5.1)
Alkaline phosphatase (APISO): 100 U/L (ref 36–130)
BUN: 11 mg/dL (ref 7–25)
CO2: 31 mmol/L (ref 20–32)
Calcium: 9.6 mg/dL (ref 8.6–10.3)
Chloride: 101 mmol/L (ref 98–110)
Creat: 1.19 mg/dL (ref 0.60–1.26)
Globulin: 2.8 g/dL (calc) (ref 1.9–3.7)
Glucose, Bld: 82 mg/dL (ref 65–99)
Potassium: 4.2 mmol/L (ref 3.5–5.3)
Sodium: 139 mmol/L (ref 135–146)
Total Bilirubin: 0.9 mg/dL (ref 0.2–1.2)
Total Protein: 7.1 g/dL (ref 6.1–8.1)
eGFR: 80 mL/min/{1.73_m2} (ref >=60)

## 2021-09-26 LAB — LIPID PANEL
Cholesterol: 250 mg/dL — ABNORMAL HIGH (ref <200)
HDL: 50 mg/dL (ref >=40)
LDL Cholesterol (Calc): 173 mg/dL (calc) — ABNORMAL HIGH
Non-HDL Cholesterol (Calc): 200 mg/dL (calc) — ABNORMAL HIGH (ref <130)
Total CHOL/HDL Ratio: 5 (calc) — ABNORMAL HIGH (ref <5.0)
Triglycerides: 137 mg/dL (ref <150)

## 2021-10-29 ENCOUNTER — Encounter: Payer: Self-pay | Admitting: Emergency Medicine

## 2021-10-29 ENCOUNTER — Other Ambulatory Visit: Payer: Self-pay

## 2021-10-29 ENCOUNTER — Ambulatory Visit
Admission: EM | Admit: 2021-10-29 | Discharge: 2021-10-29 | Disposition: A | Discharge status: Home / Self Care | Payer: No Typology Code available for payment source | Attending: Physician Assistant | Admitting: Physician Assistant

## 2021-10-29 DIAGNOSIS — L0291 Cutaneous abscess, unspecified: Secondary | ICD-10-CM

## 2021-10-29 MED ORDER — DOXYCYCLINE HYCLATE 100 MG PO CAPS: 100.0000 mg | ORAL_CAPSULE | Freq: Two times a day (BID) | ORAL | 0 refills | Status: DC

## 2021-10-29 NOTE — ED Triage Notes (Signed)
Pt here for possible insect bite to right lower leg x 3 days

## 2021-10-29 NOTE — ED Provider Notes (Signed)
[facility]    CSN: [id number] Arrival date & time: 10/29/21  1553      History   Chief Complaint Chief Complaint  Patient presents with   Insect Bite    HPI Luis Beard is a 41 y.o. male.   Patient here today for evaluation of possible insect bite to his right lower leg that he noted 3 days ago.  He reports that area is very tender and he has continued to have some swelling.  He denies any drainage.  He has not had fever.  The history is provided by the patient.   Past Medical History:  Diagnosis Date   Asthma     There are no problems to display for this patient.   History reviewed. No pertinent surgical history.     Home Medications    Prior to Admission medications   Medication Sig Start Date End Date Taking? Authorizing Provider  doxycycline (VIBRAMYCIN) 100 MG capsule Take 1 capsule (100 mg total) by mouth 2 (two) times daily. 10/29/21  Yes Tomi Bamberger, PA-C  albuterol (VENTOLIN HFA) 108 (90 Base) MCG/ACT inhaler Inhale 1-2 puffs into the lungs every 6 (six) hours as needed for wheezing or shortness of breath. 06/05/20   Hall-Potvin, [place], PA-C  amLODipine (NORVASC) 5 MG tablet Take 1 tablet (5 mg total) by mouth daily. 02/06/21   Little, Ambrose Finland, MD  azithromycin (ZITHROMAX Z-PAK) 250 MG tablet Take 1 tablet (250 mg total) by mouth daily. 2 pills first day Patient not taking: Reported on 10/29/2021 02/06/21   Terrilee Files, MD  benzonatate (TESSALON) 100 MG capsule Take 1 capsule (100 mg total) by mouth every 8 (eight) hours. Patient not taking: Reported on 10/29/2021 02/06/21   Terrilee Files, MD  predniSONE (DELTASONE) 50 MG tablet Take 1 tablet (50 mg total) by mouth daily with breakfast. Patient not taking: Reported on 10/29/2021 06/05/20   Hall-Potvin, [place], PA-C  cetirizine (ZYRTEC ALLERGY) 10 MG tablet Take 1 tablet (10 mg total) by mouth daily. Patient not taking: Reported on 12/27/2019 01/19/19 12/27/19  Belinda Fisher, PA-C   fluticasone [facility]) 50 MCG/ACT nasal spray Place 2 sprays into both nostrils daily. 12/27/19 06/05/20  Cathie Hoops, Amy V, PA-C  ipratropium (ATROVENT) 0.06 % nasal spray Place 2 sprays into both nostrils 4 (four) times daily. 12/27/19 06/05/20  Belinda Fisher, PA-C    Family History Family History  Problem Relation Age of Onset   Hypertension Mother    Diabetes Mother     Social History Social History   Tobacco Use   Smoking status: Former    Packs/day: 1.00    Types: Cigars, Cigarettes    Quit date: 04/15/2017    Years since quitting: 4.5   Smokeless tobacco: Never  Substance Use Topics   Alcohol use: Yes    Comment: occasionally   Drug use: Yes    Frequency: 7.0 times per week    Types: Marijuana     Allergies   Patient has no known allergies.   Review of Systems Review of Systems  Constitutional:  Negative for chills and fever.  Eyes:  Negative for discharge and redness.  Respiratory:  Negative for shortness of breath.   Gastrointestinal:  Negative for nausea and vomiting.  Skin:  Positive for color change and wound.  Neurological:  Negative for numbness.    Physical Exam Triage Vital Signs ED Triage Vitals  Enc Vitals Group     BP 10/29/21 1636 (!) 197/139  Pulse Rate 10/29/21 1636 83     Resp 10/29/21 1636 18     Temp 10/29/21 1636 (!) 97.5 F (36.4 C)     Temp Source 10/29/21 1636 Oral     SpO2 10/29/21 1636 92 %     Weight --      Height --      Head Circumference --      Peak Flow --      Pain Score 10/29/21 1637 2     Pain Loc --      Pain Edu? --      Excl. in GC? --    No data found.  Updated Vital Signs BP (!) 188/143 (BP Location: Left Arm)    Pulse 83    Temp (!) 97.5 F (36.4 C) (Oral)    Resp 18    SpO2 92%      Physical Exam Vitals and nursing note reviewed.  Constitutional:      General: He is not in acute distress.    Appearance: Normal appearance. He is not ill-appearing.  HENT:     Head: Normocephalic and atraumatic.  Eyes:      Conjunctiva/sclera: Conjunctivae normal.  Cardiovascular:     Rate and Rhythm: Normal rate.  Pulmonary:     Effort: Pulmonary effort is normal.  Skin:    Comments: Small area of induration with noted erythema surrounding central superficial wound to right lower leg, no active bleeding or drainage noted  Neurological:     Mental Status: He is alert.  Psychiatric:        Mood and Affect: Mood normal.        Behavior: Behavior normal.        Thought Content: Thought content normal.     UC Treatments / Results  Labs (all labs ordered are listed, but only abnormal results are displayed) Labs Reviewed - No data to display  EKG   Radiology No results found.  Procedures Procedures (including critical care time)  Medications Ordered in UC Medications - No data to display  Initial Impression / Assessment and Plan / UC Course  I have reviewed the triage vital signs and the nursing notes.  Pertinent labs & imaging results that were available during my care of the patient were reviewed by me and considered in my medical decision making (see chart for details).    We will treat with antibiotic to cover suspected abscess.  Discussed elevated blood pressure in office and appointment made with primary care before leaving.  Patient denies any chest pain, shortness of breath or headache today concerning for hypertensive emergency.  Patient does note that he has not taken any medication today.  Encouraged follow-up with any further concerns.  Final Clinical Impressions(s) / UC Diagnoses   Final diagnoses:  Abscess   Discharge Instructions   None    ED Prescriptions     Medication Sig Dispense Auth. Provider   doxycycline (VIBRAMYCIN) 100 MG capsule Take 1 capsule (100 mg total) by mouth 2 (two) times daily. 20 capsule Tomi Bamberger, PA-C      PDMP not reviewed this encounter.   Tomi Bamberger, PA-C 10/29/21 1720

## 2021-12-19 ENCOUNTER — Encounter: Payer: Self-pay | Admitting: Family Medicine

## 2021-12-19 ENCOUNTER — Ambulatory Visit (INDEPENDENT_AMBULATORY_CARE_PROVIDER_SITE_OTHER): Payer: No Typology Code available for payment source | Admitting: Family Medicine

## 2021-12-19 VITALS — BP 173/115 | HR 95 | Temp 98.1°F | Resp 16 | Ht 69.0 in | Wt 312.4 lb

## 2021-12-19 DIAGNOSIS — Z6841 Body Mass Index (BMI) 40.0 and over, adult: Secondary | ICD-10-CM

## 2021-12-19 DIAGNOSIS — J4520 Mild intermittent asthma, uncomplicated: Secondary | ICD-10-CM

## 2021-12-19 DIAGNOSIS — I10 Essential (primary) hypertension: Secondary | ICD-10-CM

## 2021-12-19 DIAGNOSIS — Z7689 Persons encountering health services in other specified circumstances: Secondary | ICD-10-CM

## 2021-12-19 MED ORDER — FLUTICASONE-SALMETEROL 500-50 MCG/ACT IN AEPB: 1.0000 | INHALATION_SPRAY | Freq: Two times a day (BID) | RESPIRATORY_TRACT | 0 refills | Status: DC

## 2021-12-19 MED ORDER — HYDROCHLOROTHIAZIDE 25 MG PO TABS: 25.0000 mg | ORAL_TABLET | Freq: Every day | ORAL | 0 refills | Status: DC

## 2021-12-19 MED ORDER — VALSARTAN 160 MG PO TABS: 160.0000 mg | ORAL_TABLET | Freq: Every day | ORAL | 0 refills | Status: DC

## 2021-12-19 NOTE — Progress Notes (Signed)
? ?New Patient Office Visit ? ?Subjective   ? ?Patient ID: Luis Beard, male    DOB: [birth year]  Age: 41 y.o. MRN: [id number] ? ?CC:  ?Chief Complaint  ?Patient presents with  ? Establish Care  ? Hypertension  ? ? ?HPI ?Luis Beard presents to establish care. Patient also reports that he has been having allergy sx and elevated blood pressure sx.  ? ? ?Outpatient Encounter Medications as of 12/19/2021  ?Medication Sig  ? albuterol (VENTOLIN HFA) 108 (90 Base) MCG/ACT inhaler Inhale 1-2 puffs into the lungs every 6 (six) hours as needed for wheezing or shortness of breath.  ? chlorpheniramine-HYDROcodone 10-8 MG/5ML TAKE 1 TEASPOONFUL EVERY 12 HOURS AS NEEDED  ? fluticasone (FLONASE) 50 MCG/ACT nasal spray Place into the nose.  ? fluticasone-salmeterol (ADVAIR DISKUS) 500-50 MCG/ACT AEPB Inhale 1 puff into the lungs in the morning and at bedtime.  ? hydrochlorothiazide (HYDRODIURIL) 25 MG tablet Take 1 tablet (25 mg total) by mouth daily.  ? naproxen (NAPROSYN) 500 MG tablet Take by mouth.  ? valsartan (DIOVAN) 160 MG tablet Take 1 tablet (160 mg total) by mouth daily.  ? [DISCONTINUED] hydrochlorothiazide (MICROZIDE) 12.5 MG capsule Take 12.5 mg by mouth daily.  ? [DISCONTINUED] amLODipine (NORVASC) 5 MG tablet Take 1 tablet (5 mg total) by mouth daily.  ? [DISCONTINUED] azithromycin (ZITHROMAX Z-PAK) 250 MG tablet Take 1 tablet (250 mg total) by mouth daily. 2 pills first day (Patient not taking: Reported on 10/29/2021)  ? [DISCONTINUED] benzonatate (TESSALON) 100 MG capsule Take 1 capsule (100 mg total) by mouth every 8 (eight) hours. (Patient not taking: Reported on 10/29/2021)  ? [DISCONTINUED] cetirizine (ZYRTEC ALLERGY) 10 MG tablet Take 1 tablet (10 mg total) by mouth daily. (Patient not taking: Reported on 12/27/2019)  ? [DISCONTINUED] doxycycline (VIBRAMYCIN) 100 MG capsule Take 1 capsule (100 mg total) by mouth 2 (two) times daily.  ? [DISCONTINUED] fluticasone (FLONASE) 50 MCG/ACT nasal spray Place 2  sprays into both nostrils daily.  ? [DISCONTINUED] ipratropium (ATROVENT) 0.06 % nasal spray Place 2 sprays into both nostrils 4 (four) times daily.  ? [DISCONTINUED] predniSONE (DELTASONE) 50 MG tablet Take 1 tablet (50 mg total) by mouth daily with breakfast. (Patient not taking: Reported on 10/29/2021)  ? ?No facility-administered encounter medications on file as of 12/19/2021.  ? ? ?Past Medical History:  ?Diagnosis Date  ? Asthma   ? Hypertension   ? ? ?History reviewed. No pertinent surgical history. ? ?Family History  ?Problem Relation Age of Onset  ? Hypertension Mother   ? Diabetes Mother   ? ? ?Social History  ? ?Socioeconomic History  ? Marital status: Married  ?  Spouse name: Not on file  ? Number of children: Not on file  ? Years of education: Not on file  ? Highest education level: Not on file  ?Occupational History  ? Not on file  ?Tobacco Use  ? Smoking status: Former  ?  Packs/day: 1.00  ?  Types: Cigars, Cigarettes  ?  Quit date: 04/15/2017  ?  Years since quitting: 4.6  ? Smokeless tobacco: Never  ?Substance and Sexual Activity  ? Alcohol use: Yes  ?  Comment: occasionally  ? Drug use: Yes  ?  Frequency: 7.0 times per week  ?  Types: Marijuana  ? Sexual activity: Yes  ?  Birth control/protection: Condom  ?Other Topics Concern  ? Not on file  ?Social History Narrative  ? Not on file  ? ?Social Determinants of Health  ? ?  Financial Resource Strain: Not on file  ?Food Insecurity: Not on file  ?Transportation Needs: Not on file  ?Physical Activity: Not on file  ?Stress: Not on file  ?Social Connections: Not on file  ?Intimate Partner Violence: Not on file  ? ? ?Review of Systems  ?HENT:  Positive for congestion.   ?Respiratory:  Positive for cough. Negative for wheezing.   ?Cardiovascular: Negative.   ?All other systems reviewed and are negative. ? ?  ? ? ?Objective   ? ?BP (!) 173/115   Pulse 95   Temp 98.1 ?F (36.7 ?C) (Oral)   Resp 16   Ht 5\' 9"  (1.753 m)   Wt (!) 312 lb 6.4 oz (141.7 kg)   SpO2  92%   BMI 46.13 kg/m?  ? ?Physical Exam ?Vitals and nursing note reviewed.  ?Constitutional:   ?   General: He is not in acute distress. ?HENT:  ?   Nose: No congestion.  ?Cardiovascular:  ?   Rate and Rhythm: Normal rate and regular rhythm.  ?Pulmonary:  ?   Effort: Pulmonary effort is normal. No respiratory distress.  ?   Breath sounds: Normal breath sounds. No wheezing.  ?Abdominal:  ?   Palpations: Abdomen is soft.  ?   Tenderness: There is no abdominal tenderness.  ?Neurological:  ?   General: No focal deficit present.  ?   Mental Status: He is alert and oriented to person, place, and time.  ? ? ? ?  ? ?Assessment & Plan:  ? ?1. Uncontrolled hypertension ?Diovan prescribed. Monitor ? ?2. Mild intermittent reactive airway disease without complication ?Advair prescribed. Patient to utilize albuterol MDI ? ?3. Class 3 severe obesity due to excess calories with serious comorbidity and body mass index (BMI) of 45.0 to 49.9 in adult [facility]) ?Discussed dietary and activity options.  ? ?4. Encounter to establish care ? ? ? ? ?Return in about 3 weeks (around 01/09/2022).  ? ?03/11/2022, MD ? ? ?

## 2021-12-19 NOTE — Progress Notes (Signed)
Patient is new to provider. Patient would like to get his HTN under control ?

## 2022-01-09 ENCOUNTER — Ambulatory Visit (INDEPENDENT_AMBULATORY_CARE_PROVIDER_SITE_OTHER): Payer: Self-pay | Admitting: Family Medicine

## 2022-01-09 ENCOUNTER — Encounter: Payer: Self-pay | Admitting: Family Medicine

## 2022-01-09 VITALS — BP 163/109 | HR 88 | Temp 98.1°F | Resp 18 | Wt 311.0 lb

## 2022-01-09 DIAGNOSIS — I10 Essential (primary) hypertension: Secondary | ICD-10-CM

## 2022-01-09 MED ORDER — VALSARTAN 320 MG PO TABS: 320.0000 mg | ORAL_TABLET | Freq: Every day | ORAL | 0 refills | Status: DC

## 2022-01-09 NOTE — Progress Notes (Signed)
? ?Established Patient Office Visit ? ?Subjective   ? ?Patient ID: Luis Beard, male    DOB: 12-24-1980  Age: 41 y.o. MRN: [id number] ? ?CC:  ?Chief Complaint  ?Patient presents with  ? Hypertension  ? ? ?HPI ?Marquis Down presents for follow up of hypertension. Patient denies acute complaints.  ? ? ?Outpatient Encounter Medications as of 01/09/2022  ?Medication Sig  ? albuterol (VENTOLIN HFA) 108 (90 Base) MCG/ACT inhaler Inhale 1-2 puffs into the lungs every 6 (six) hours as needed for wheezing or shortness of breath.  ? chlorpheniramine-HYDROcodone 10-8 MG/5ML TAKE 1 TEASPOONFUL EVERY 12 HOURS AS NEEDED  ? fluticasone (FLONASE) 50 MCG/ACT nasal spray Place into the nose.  ? fluticasone-salmeterol (ADVAIR DISKUS) 500-50 MCG/ACT AEPB Inhale 1 puff into the lungs in the morning and at bedtime.  ? hydrochlorothiazide (HYDRODIURIL) 25 MG tablet Take 1 tablet by mouth daily.  ? naproxen (NAPROSYN) 500 MG tablet Take by mouth.  ? [DISCONTINUED] valsartan (DIOVAN) 160 MG tablet Take 1 tablet (160 mg total) by mouth daily.  ? [DISCONTINUED] cetirizine (ZYRTEC ALLERGY) 10 MG tablet Take 1 tablet (10 mg total) by mouth daily. (Patient not taking: Reported on 12/27/2019)  ? [DISCONTINUED] hydrochlorothiazide (HYDRODIURIL) 25 MG tablet Take 1 tablet (25 mg total) by mouth daily.  ? [DISCONTINUED] ipratropium (ATROVENT) 0.06 % nasal spray Place 2 sprays into both nostrils 4 (four) times daily.  ? ?No facility-administered encounter medications on file as of 01/09/2022.  ? ? ?Past Medical History:  ?Diagnosis Date  ? Asthma   ? Hypertension   ? ? ?No past surgical history on file. ? ?Family History  ?Problem Relation Age of Onset  ? Hypertension Mother   ? Diabetes Mother   ? ? ?Social History  ? ?Socioeconomic History  ? Marital status: Married  ?  Spouse name: Not on file  ? Number of children: Not on file  ? Years of education: Not on file  ? Highest education level: Not on file  ?Occupational History  ? Not on file   ?Tobacco Use  ? Smoking status: Former  ?  Packs/day: 1.00  ?  Types: Cigars, Cigarettes  ?  Quit date: 04/15/2017  ?  Years since quitting: 4.7  ? Smokeless tobacco: Never  ?Substance and Sexual Activity  ? Alcohol use: Yes  ?  Comment: occasionally  ? Drug use: Yes  ?  Frequency: 7.0 times per week  ?  Types: Marijuana  ? Sexual activity: Yes  ?  Birth control/protection: Condom  ?Other Topics Concern  ? Not on file  ?Social History Narrative  ? Not on file  ? ?Social Determinants of Health  ? ?Financial Resource Strain: Not on file  ?Food Insecurity: Not on file  ?Transportation Needs: Not on file  ?Physical Activity: Not on file  ?Stress: Not on file  ?Social Connections: Not on file  ?Intimate Partner Violence: Not on file  ? ? ?Review of Systems  ?All other systems reviewed and are negative. ? ?  ? ? ?Objective   ? ?BP (!) 163/109   Pulse 88   Temp 98.1 ?F (36.7 ?C) (Oral)   Resp 18   Wt (!) 311 lb (141.1 kg)   SpO2 (!) 88%   BMI 45.93 kg/m?  ? ?Physical Exam ?Vitals and nursing note reviewed.  ?Constitutional:   ?   General: He is not in acute distress. ?   Appearance: He is obese.  ?HENT:  ?   Nose: No congestion.  ?Cardiovascular:  ?  Rate and Rhythm: Normal rate and regular rhythm.  ?Pulmonary:  ?   Effort: Pulmonary effort is normal. No respiratory distress.  ?   Breath sounds: Normal breath sounds. No wheezing.  ?Abdominal:  ?   Palpations: Abdomen is soft.  ?   Tenderness: There is no abdominal tenderness.  ?Musculoskeletal:  ?   Right lower leg: No edema.  ?   Left lower leg: No edema.  ?Neurological:  ?   General: No focal deficit present.  ?   Mental Status: He is alert and oriented to person, place, and time.  ? ? ? ?  ? ?Assessment & Plan:  ?1. Uncontrolled hypertension ?Some improvement but readings remain elevated. Will increase losartan from 160 mg to 360 mg and monitor ? ? ? ?No follow-ups on file.  ? ?[name], MD ? ? ?

## 2022-01-09 NOTE — Progress Notes (Signed)
Patient is here for bp recheck. Patient said that he feel a lot better. ?

## 2022-01-10 ENCOUNTER — Encounter: Payer: Self-pay | Admitting: Family Medicine

## 2022-01-16 ENCOUNTER — Ambulatory Visit: Payer: Self-pay

## 2022-01-16 NOTE — Telephone Encounter (Signed)
Spoke with pt's wife Tiffany. She states that pt went to pick up the valsartan and since his insurance ended and new doesn't start until June 1st she wanted know if he could take any of her medications for BP until he is seen for OV. She named the medications and I advised her that none of them were the same as what pt is taking. I offered to her that she can use Goodrx coupon to get the medication for a 30 day supply from [facility] for #3.84. she said she was going to go ahead and call [facility] to transfer rx there and then try to get it from there. No further assistance needed.   Summary: med advice   Pt insurance will not take effect until next month.  Pt went to pick up his meds, and it was over $300. Wife has some meds left over and wants to know if it is safe and do they match what pt is taking?  She will let yo know what she has when you call.      Reason for Disposition  Caller has medicine question only, adult not sick, AND triager answers question  Answer Assessment - Initial Assessment Questions 1. NAME of MEDICATION: "What medicine are you calling about?"     valsartan 2. QUESTION: "What is your question?" (e.g., double dose of medicine, side effect)     Wanting to see if any of wife's medications were similar for pt to take 3. PRESCRIBING HCP: "Who prescribed it?" Reason: if prescribed by specialist, call should be referred to that group.     Dr. Andrey Campanile  Protocols used: Medication Question Call-A-AH

## 2022-01-23 ENCOUNTER — Encounter: Payer: Self-pay | Admitting: Family Medicine

## 2022-01-23 ENCOUNTER — Ambulatory Visit (INDEPENDENT_AMBULATORY_CARE_PROVIDER_SITE_OTHER): Payer: Self-pay | Admitting: Family Medicine

## 2022-01-23 VITALS — BP 159/105 | HR 82 | Temp 98.1°F | Resp 18 | Wt 312.8 lb

## 2022-01-23 DIAGNOSIS — I10 Essential (primary) hypertension: Secondary | ICD-10-CM

## 2022-01-23 DIAGNOSIS — Z6841 Body Mass Index (BMI) 40.0 and over, adult: Secondary | ICD-10-CM

## 2022-01-23 MED ORDER — AMLODIPINE BESYLATE 10 MG PO TABS: 10.0000 mg | ORAL_TABLET | Freq: Every day | ORAL | 0 refills | Status: DC

## 2022-01-23 NOTE — Progress Notes (Signed)
Established Patient Office Visit  Subjective    Patient ID: Luis Beard, male    DOB: 10/27/1980  Age: 41 y.o. MRN: [id number]  CC:  Chief Complaint  Patient presents with   Follow-up   Hypertension    HPI Luis Beard presents for follow up of hypertension. Patient denies acute complaints or concerns.    Outpatient Encounter Medications as of 01/23/2022  Medication Sig   albuterol (VENTOLIN HFA) 108 (90 Base) MCG/ACT inhaler Inhale 1-2 puffs into the lungs every 6 (six) hours as needed for wheezing or shortness of breath.   amLODipine (NORVASC) 10 MG tablet Take 1 tablet (10 mg total) by mouth daily.   chlorpheniramine-HYDROcodone 10-8 MG/5ML TAKE 1 TEASPOONFUL EVERY 12 HOURS AS NEEDED   fluticasone (FLONASE) 50 MCG/ACT nasal spray Place into the nose.   fluticasone-salmeterol (ADVAIR DISKUS) 500-50 MCG/ACT AEPB Inhale 1 puff into the lungs in the morning and at bedtime.   hydrochlorothiazide (HYDRODIURIL) 25 MG tablet Take 1 tablet by mouth daily.   naproxen (NAPROSYN) 500 MG tablet Take by mouth.   valsartan (DIOVAN) 320 MG tablet Take 1 tablet (320 mg total) by mouth daily.   [DISCONTINUED] cetirizine (ZYRTEC ALLERGY) 10 MG tablet Take 1 tablet (10 mg total) by mouth daily. (Patient not taking: Reported on 12/27/2019)   [DISCONTINUED] ipratropium (ATROVENT) 0.06 % nasal spray Place 2 sprays into both nostrils 4 (four) times daily.   No facility-administered encounter medications on file as of 01/23/2022.    Past Medical History:  Diagnosis Date   Asthma    Hypertension     History reviewed. No pertinent surgical history.  Family History  Problem Relation Age of Onset   Hypertension Mother    Diabetes Mother     Social History   Socioeconomic History   Marital status: Married    Spouse name: Not on file   Number of children: Not on file   Years of education: Not on file   Highest education level: Not on file  Occupational History   Not on file   Tobacco Use   Smoking status: Former    Packs/day: 1.00    Types: Cigars, Cigarettes    Quit date: 04/15/2017    Years since quitting: 4.7   Smokeless tobacco: Never  Substance and Sexual Activity   Alcohol use: Yes    Comment: occasionally   Drug use: Yes    Frequency: 7.0 times per week    Types: Marijuana   Sexual activity: Yes    Birth control/protection: Condom  Other Topics Concern   Not on file  Social History Narrative   Not on file   Social Determinants of Health   Financial Resource Strain: Not on file  Food Insecurity: Not on file  Transportation Needs: Not on file  Physical Activity: Not on file  Stress: Not on file  Social Connections: Not on file  Intimate Partner Violence: Not on file    Review of Systems  All other systems reviewed and are negative.      Objective    BP (!) 159/105   Pulse 82   Temp 98.1 F (36.7 C) (Oral)   Resp 18   Wt (!) 312 lb 12.8 oz (141.9 kg)   BMI 46.19 kg/m   Physical Exam Vitals and nursing note reviewed.  Constitutional:      General: He is not in acute distress.    Appearance: He is obese.  Cardiovascular:     Rate and Rhythm: Normal rate  and regular rhythm.  Pulmonary:     Effort: Pulmonary effort is normal.     Breath sounds: Normal breath sounds.  Abdominal:     Palpations: Abdomen is soft.     Tenderness: There is no abdominal tenderness.  Musculoskeletal:     Right lower leg: No edema.     Left lower leg: No edema.  Neurological:     General: No focal deficit present.     Mental Status: He is alert and oriented to person, place, and time.        Assessment & Plan:   1. Uncontrolled hypertension Readings minimally improved. Will add amlodipine 10 mg daily to regimen and monitor  2. Class 3 severe obesity due to excess calories with serious comorbidity and body mass index (BMI) of 45.0 to 49.9 in adult [facility]) Discussed dietary and activity options. Goal is 4-6lbs/mo wt loss.     Return  in about 1 week (around 01/30/2022) for follow up.   Luis Raymond, MD

## 2022-01-23 NOTE — Progress Notes (Signed)
Patient is here for uncontrolled HTN. This is a recheck of his blood pressure . Patient is hoping to get a rhinoplasty surgery soon

## 2022-01-30 ENCOUNTER — Ambulatory Visit (INDEPENDENT_AMBULATORY_CARE_PROVIDER_SITE_OTHER): Payer: Commercial Managed Care - HMO | Admitting: Family Medicine

## 2022-01-30 ENCOUNTER — Ambulatory Visit: Payer: Self-pay | Admitting: Family Medicine

## 2022-01-30 ENCOUNTER — Encounter: Payer: Self-pay | Admitting: Family Medicine

## 2022-01-30 VITALS — BP 138/88 | HR 76 | Temp 98.1°F | Resp 16 | Wt 313.4 lb

## 2022-01-30 DIAGNOSIS — I10 Essential (primary) hypertension: Secondary | ICD-10-CM

## 2022-01-30 DIAGNOSIS — G4733 Obstructive sleep apnea (adult) (pediatric): Secondary | ICD-10-CM

## 2022-01-30 DIAGNOSIS — Z6841 Body Mass Index (BMI) 40.0 and over, adult: Secondary | ICD-10-CM

## 2022-01-30 MED ORDER — HYDROCHLOROTHIAZIDE 25 MG PO TABS: 25.0000 mg | ORAL_TABLET | Freq: Every day | ORAL | 0 refills | Status: DC

## 2022-01-30 MED ORDER — VALSARTAN 320 MG PO TABS: 320.0000 mg | ORAL_TABLET | Freq: Every day | ORAL | 0 refills | Status: DC

## 2022-01-30 MED ORDER — AMLODIPINE BESYLATE 10 MG PO TABS: 10.0000 mg | ORAL_TABLET | Freq: Every day | ORAL | 0 refills | Status: DC

## 2022-01-30 NOTE — Progress Notes (Signed)
Established Patient Office Visit  Subjective    Patient ID: Luis Beard, male    DOB: 01/12/1981  Age: 41 y.o. MRN: [id number]  CC: No chief complaint on file.   HPI Luis Beard presents for follow up of hypertension. Patient denies acute complaints or concerns.    Outpatient Encounter Medications as of 01/30/2022  Medication Sig   albuterol (VENTOLIN HFA) 108 (90 Base) MCG/ACT inhaler Inhale 1-2 puffs into the lungs every 6 (six) hours as needed for wheezing or shortness of breath.   amLODipine (NORVASC) 10 MG tablet Take 1 tablet (10 mg total) by mouth daily.   chlorpheniramine-HYDROcodone 10-8 MG/5ML TAKE 1 TEASPOONFUL EVERY 12 HOURS AS NEEDED   fluticasone (FLONASE) 50 MCG/ACT nasal spray Place into the nose.   fluticasone-salmeterol (ADVAIR DISKUS) 500-50 MCG/ACT AEPB Inhale 1 puff into the lungs in the morning and at bedtime.   hydrochlorothiazide (HYDRODIURIL) 25 MG tablet Take 1 tablet (25 mg total) by mouth daily.   naproxen (NAPROSYN) 500 MG tablet Take by mouth.   valsartan (DIOVAN) 320 MG tablet Take 1 tablet (320 mg total) by mouth daily.   [DISCONTINUED] amLODipine (NORVASC) 10 MG tablet Take 1 tablet (10 mg total) by mouth daily.   [DISCONTINUED] cetirizine (ZYRTEC ALLERGY) 10 MG tablet Take 1 tablet (10 mg total) by mouth daily. (Patient not taking: Reported on 12/27/2019)   [DISCONTINUED] hydrochlorothiazide (HYDRODIURIL) 25 MG tablet Take 1 tablet by mouth daily.   [DISCONTINUED] ipratropium (ATROVENT) 0.06 % nasal spray Place 2 sprays into both nostrils 4 (four) times daily.   [DISCONTINUED] valsartan (DIOVAN) 320 MG tablet Take 1 tablet (320 mg total) by mouth daily.   No facility-administered encounter medications on file as of 01/30/2022.    Past Medical History:  Diagnosis Date   Asthma    Hypertension     No past surgical history on file.  Family History  Problem Relation Age of Onset   Hypertension Mother    Diabetes Mother     Social  History   Socioeconomic History   Marital status: Married    Spouse name: Not on file   Number of children: Not on file   Years of education: Not on file   Highest education level: Not on file  Occupational History   Not on file  Tobacco Use   Smoking status: Former    Packs/day: 1.00    Types: Cigars, Cigarettes    Quit date: 04/15/2017    Years since quitting: 4.7   Smokeless tobacco: Never  Substance and Sexual Activity   Alcohol use: Yes    Comment: occasionally   Drug use: Yes    Frequency: 7.0 times per week    Types: Marijuana   Sexual activity: Yes    Birth control/protection: Condom  Other Topics Concern   Not on file  Social History Narrative   Not on file   Social Determinants of Health   Financial Resource Strain: Not on file  Food Insecurity: Not on file  Transportation Needs: Not on file  Physical Activity: Not on file  Stress: Not on file  Social Connections: Not on file  Intimate Partner Violence: Not on file    Review of Systems  All other systems reviewed and are negative.      Objective    BP 138/88   Pulse 76   Temp 98.1 F (36.7 C) (Oral)   Resp 16   Wt (!) 313 lb 6.4 oz (142.2 kg)   SpO2 (!) 83%  Comment: patient has a nose flow issues  BMI 46.28 kg/m   Physical Exam Vitals and nursing note reviewed.  Constitutional:      General: He is not in acute distress.    Appearance: He is obese.  Cardiovascular:     Rate and Rhythm: Normal rate and regular rhythm.  Pulmonary:     Effort: Pulmonary effort is normal.     Breath sounds: Normal breath sounds.  Abdominal:     Palpations: Abdomen is soft.     Tenderness: There is no abdominal tenderness.  Musculoskeletal:     Right lower leg: No edema.     Left lower leg: No edema.  Neurological:     General: No focal deficit present.     Mental Status: He is alert and oriented to person, place, and time.        Assessment & Plan:   1. Essential hypertension Appears stable  with present management. Continue and monitor  2. Obstructive sleep apnea Compliance discussed continue to wear cpap as recommended  3. Class 3 severe obesity due to excess calories with serious comorbidity and body mass index (BMI) of 45.0 to 49.9 in adult [facility]) Discussed dietary and activity options. Goal is 4-6lbs/mo wt loss.     Return in about 3 months (around 05/02/2022) for follow up.   [name], MD

## 2022-01-30 NOTE — Progress Notes (Signed)
Patient is here for f/up blood pressure. Patient said he slept with his c-pap machine and that 's why his BP is good today

## 2022-02-11 ENCOUNTER — Other Ambulatory Visit: Payer: Self-pay | Admitting: Otolaryngology

## 2022-02-18 ENCOUNTER — Ambulatory Visit: Payer: Self-pay

## 2022-02-18 ENCOUNTER — Ambulatory Visit: Payer: Commercial Managed Care - HMO | Admitting: Family

## 2022-02-18 DIAGNOSIS — I10 Essential (primary) hypertension: Secondary | ICD-10-CM

## 2022-02-18 NOTE — Telephone Encounter (Signed)
  Chief Complaint: High BP measurements Symptoms: None Frequency: ongoing issue Pertinent Negatives: Patient denies HA, dizziness Disposition: [] ED /[] Urgent Care (no appt availability in office) / [x] Appointment(In office/virtual)/ []  [place] [facility]/ [] Home Care/ [] Refused Recommended Disposition /[] [place] [facility]/ []  Follow-up with PCP Additional Notes: PT stated that Bp readings have been creeping back up. Today reading was 168/97.Pt also notes some visual problems, but states that he was told he needs glasses and this is nothing new. Reason for Disposition  Systolic BP  >= 160 OR Diastolic >= 100  Answer Assessment - Initial Assessment Questions 1. BLOOD PRESSURE: "What is the blood pressure?" "Did you take at least two measurements 5 minutes apart?"     168/97 1 time 2. ONSET: "When did you take your blood pressure?"     A few minutes ago 3. HOW: "How did you obtain the blood pressure?" (e.g., visiting nurse, automatic home BP monitor)     automatic 4. HISTORY: "Do you have a history of high blood pressure?"     yes 5. MEDICATIONS: "Are you taking any medications for blood pressure?" "Have you missed any doses recently?"     no 6. OTHER SYMPTOMS: "Do you have any symptoms?" (e.g., headache, chest pain, blurred vision, difficulty breathing, weakness)     no 7. PREGNANCY: "Is there any chance you are pregnant?" "When was your last menstrual period?"     na  Protocols used: Blood Pressure - High-A-AH

## 2022-02-18 NOTE — Progress Notes (Signed)
Pt presents for BP check, BP within range, pt states monitor is old or not big enough for his arm  This encounter was created in error - please disregard.

## 2022-03-20 ENCOUNTER — Other Ambulatory Visit: Payer: Self-pay

## 2022-03-20 ENCOUNTER — Encounter (HOSPITAL_BASED_OUTPATIENT_CLINIC_OR_DEPARTMENT_OTHER): Payer: Self-pay | Admitting: Otolaryngology

## 2022-03-24 ENCOUNTER — Encounter (HOSPITAL_BASED_OUTPATIENT_CLINIC_OR_DEPARTMENT_OTHER)
Admission: RE | Admit: 2022-03-24 | Discharge: 2022-03-24 | Disposition: A | Discharge status: Home / Self Care | Payer: Commercial Managed Care - HMO | Source: Ambulatory Visit | Attending: Otolaryngology | Admitting: Otolaryngology

## 2022-03-24 LAB — BASIC METABOLIC PANEL
Anion gap: 11 (ref 5–15)
BUN: 6 mg/dL (ref 6–20)
CO2: 33 mmol/L — ABNORMAL HIGH (ref 22–32)
Calcium: 8.7 mg/dL — ABNORMAL LOW (ref 8.9–10.3)
Chloride: 93 mmol/L — ABNORMAL LOW (ref 98–111)
Creatinine, Ser: 1.11 mg/dL (ref 0.61–1.24)
GFR, Estimated: >60 mL/min (ref >60)
Glucose, Bld: 123 mg/dL — ABNORMAL HIGH (ref 70–99)
Potassium: 4 mmol/L (ref 3.5–5.1)
Sodium: 137 mmol/L (ref 135–145)

## 2022-03-24 NOTE — Progress Notes (Signed)

## 2022-03-26 ENCOUNTER — Ambulatory Visit (HOSPITAL_BASED_OUTPATIENT_CLINIC_OR_DEPARTMENT_OTHER): Payer: Commercial Managed Care - HMO | Admitting: Anesthesiology

## 2022-03-26 ENCOUNTER — Encounter (HOSPITAL_COMMUNITY)
Admission: AD | Disposition: A | Discharge status: Home / Self Care | Payer: Self-pay | Source: Home / Self Care | Attending: Internal Medicine

## 2022-03-26 ENCOUNTER — Other Ambulatory Visit: Payer: Self-pay

## 2022-03-26 ENCOUNTER — Inpatient Hospital Stay (HOSPITAL_COMMUNITY)
Admission: AD | Admit: 2022-03-26 | Discharge: 2022-03-30 | DRG: 143 | Disposition: A | Discharge status: Home / Self Care | Payer: Commercial Managed Care - HMO | Attending: Internal Medicine | Admitting: Internal Medicine

## 2022-03-26 ENCOUNTER — Encounter (HOSPITAL_BASED_OUTPATIENT_CLINIC_OR_DEPARTMENT_OTHER): Payer: Self-pay | Admitting: Otolaryngology

## 2022-03-26 ENCOUNTER — Inpatient Hospital Stay (HOSPITAL_COMMUNITY): Payer: Commercial Managed Care - HMO

## 2022-03-26 DIAGNOSIS — G4733 Obstructive sleep apnea (adult) (pediatric): Secondary | ICD-10-CM | POA: Diagnosis present

## 2022-03-26 DIAGNOSIS — F419 Anxiety disorder, unspecified: Secondary | ICD-10-CM | POA: Diagnosis present

## 2022-03-26 DIAGNOSIS — N990 Postprocedural (acute) (chronic) kidney failure: Secondary | ICD-10-CM | POA: Diagnosis not present

## 2022-03-26 DIAGNOSIS — Z8249 Family history of ischemic heart disease and other diseases of the circulatory system: Secondary | ICD-10-CM | POA: Diagnosis not present

## 2022-03-26 DIAGNOSIS — J45909 Unspecified asthma, uncomplicated: Secondary | ICD-10-CM | POA: Diagnosis present

## 2022-03-26 DIAGNOSIS — J343 Hypertrophy of nasal turbinates: Secondary | ICD-10-CM

## 2022-03-26 DIAGNOSIS — J9601 Acute respiratory failure with hypoxia: Secondary | ICD-10-CM | POA: Diagnosis present

## 2022-03-26 DIAGNOSIS — Z87891 Personal history of nicotine dependence: Secondary | ICD-10-CM | POA: Diagnosis not present

## 2022-03-26 DIAGNOSIS — Z6841 Body Mass Index (BMI) 40.0 and over, adult: Secondary | ICD-10-CM | POA: Diagnosis not present

## 2022-03-26 DIAGNOSIS — J342 Deviated nasal septum: Secondary | ICD-10-CM

## 2022-03-26 DIAGNOSIS — J690 Pneumonitis due to inhalation of food and vomit: Secondary | ICD-10-CM | POA: Diagnosis not present

## 2022-03-26 DIAGNOSIS — N179 Acute kidney failure, unspecified: Secondary | ICD-10-CM | POA: Diagnosis not present

## 2022-03-26 DIAGNOSIS — I10 Essential (primary) hypertension: Secondary | ICD-10-CM | POA: Diagnosis present

## 2022-03-26 DIAGNOSIS — Z79899 Other long term (current) drug therapy: Secondary | ICD-10-CM | POA: Diagnosis not present

## 2022-03-26 DIAGNOSIS — E8770 Fluid overload, unspecified: Secondary | ICD-10-CM | POA: Diagnosis not present

## 2022-03-26 DIAGNOSIS — Z01818 Encounter for other preprocedural examination: Secondary | ICD-10-CM | POA: Diagnosis not present

## 2022-03-26 DIAGNOSIS — J352 Hypertrophy of adenoids: Secondary | ICD-10-CM

## 2022-03-26 HISTORY — DX: Sleep apnea, unspecified: G47.30

## 2022-03-26 HISTORY — PX: SEPTOPLASTY: SHX2393

## 2022-03-26 HISTORY — PX: ADENOIDECTOMY: SHX5191

## 2022-03-26 HISTORY — PX: NASAL TURBINATE REDUCTION: SHX2072

## 2022-03-26 LAB — POCT I-STAT 7, (LYTES, BLD GAS, ICA,H+H)
Acid-Base Excess: 10 mmol/L — ABNORMAL HIGH (ref 0.0–2.0)
Bicarbonate: 35.2 mmol/L — ABNORMAL HIGH (ref 20.0–28.0)
Calcium, Ion: 1.07 mmol/L — ABNORMAL LOW (ref 1.15–1.40)
HCT: 53 % — ABNORMAL HIGH (ref 39.0–52.0)
Hemoglobin: 18 g/dL — ABNORMAL HIGH (ref 13.0–17.0)
O2 Saturation: 99 %
Patient temperature: 98.1
Potassium: 4.2 mmol/L (ref 3.5–5.1)
Sodium: 134 mmol/L — ABNORMAL LOW (ref 135–145)
TCO2: 37 mmol/L — ABNORMAL HIGH (ref 22–32)
pCO2 arterial: 46.1 mmHg (ref 32–48)
pH, Arterial: 7.49 — ABNORMAL HIGH (ref 7.35–7.45)
pO2, Arterial: 128 mmHg — ABNORMAL HIGH (ref 83–108)

## 2022-03-26 LAB — CBC
HCT: 46.2 % (ref 39.0–52.0)
Hemoglobin: 14.3 g/dL (ref 13.0–17.0)
MCH: 23.3 pg — ABNORMAL LOW (ref 26.0–34.0)
MCHC: 31 g/dL (ref 30.0–36.0)
MCV: 75.2 fL — ABNORMAL LOW (ref 80.0–100.0)
Platelets: 262 10*3/uL (ref 150–400)
RBC: 6.14 MIL/uL — ABNORMAL HIGH (ref 4.22–5.81)
RDW: 18.1 % — ABNORMAL HIGH (ref 11.5–15.5)
WBC: 7.7 10*3/uL (ref 4.0–10.5)
nRBC: 1.4 % — ABNORMAL HIGH (ref 0.0–0.2)

## 2022-03-26 LAB — BASIC METABOLIC PANEL
Anion gap: 12 (ref 5–15)
BUN: 16 mg/dL (ref 6–20)
CO2: 30 mmol/L (ref 22–32)
Calcium: 8.6 mg/dL — ABNORMAL LOW (ref 8.9–10.3)
Chloride: 94 mmol/L — ABNORMAL LOW (ref 98–111)
Creatinine, Ser: 1.97 mg/dL — ABNORMAL HIGH (ref 0.61–1.24)
GFR, Estimated: 44 mL/min — ABNORMAL LOW (ref >60)
Glucose, Bld: 142 mg/dL — ABNORMAL HIGH (ref 70–99)
Potassium: 4.5 mmol/L (ref 3.5–5.1)
Sodium: 136 mmol/L (ref 135–145)

## 2022-03-26 LAB — COMPREHENSIVE METABOLIC PANEL
ALT: 15 U/L (ref 0–44)
AST: 16 U/L (ref 15–41)
Albumin: 3.3 g/dL — ABNORMAL LOW (ref 3.5–5.0)
Alkaline Phosphatase: 77 U/L (ref 38–126)
Anion gap: 13 (ref 5–15)
BUN: 11 mg/dL (ref 6–20)
CO2: 28 mmol/L (ref 22–32)
Calcium: 8.6 mg/dL — ABNORMAL LOW (ref 8.9–10.3)
Chloride: 94 mmol/L — ABNORMAL LOW (ref 98–111)
Creatinine, Ser: 1.6 mg/dL — ABNORMAL HIGH (ref 0.61–1.24)
GFR, Estimated: 56 mL/min — ABNORMAL LOW (ref >60)
Glucose, Bld: 133 mg/dL — ABNORMAL HIGH (ref 70–99)
Potassium: 4.7 mmol/L (ref 3.5–5.1)
Sodium: 135 mmol/L (ref 135–145)
Total Bilirubin: 0.4 mg/dL (ref 0.3–1.2)
Total Protein: 6.7 g/dL (ref 6.5–8.1)

## 2022-03-26 LAB — GLUCOSE, CAPILLARY
Glucose-Capillary: 151 mg/dL — ABNORMAL HIGH (ref 70–99)
Glucose-Capillary: 152 mg/dL — ABNORMAL HIGH (ref 70–99)
Glucose-Capillary: 178 mg/dL — ABNORMAL HIGH (ref 70–99)

## 2022-03-26 LAB — PHOSPHORUS: Phosphorus: 2 mg/dL — ABNORMAL LOW (ref 2.5–4.6)

## 2022-03-26 LAB — MAGNESIUM: Magnesium: 1.6 mg/dL — ABNORMAL LOW (ref 1.7–2.4)

## 2022-03-26 LAB — HIV ANTIBODY (ROUTINE TESTING W REFLEX): HIV Screen 4th Generation wRfx: NONREACTIVE

## 2022-03-26 LAB — HEMOGLOBIN A1C
Hgb A1c MFr Bld: 7.3 % — ABNORMAL HIGH (ref 4.8–5.6)
Mean Plasma Glucose: 162.81 mg/dL

## 2022-03-26 LAB — BRAIN NATRIURETIC PEPTIDE: B Natriuretic Peptide: 47.9 pg/mL (ref 0.0–100.0)

## 2022-03-26 SURGERY — EXCISION, NASAL TURBINATE, SUBMUCOSAL
Anesthesia: General | Site: Throat

## 2022-03-26 MED ORDER — DEXAMETHASONE SODIUM PHOSPHATE 10 MG/ML IJ SOLN
INTRAMUSCULAR | Status: AC
Start: 2022-03-26 — End: ?
  Filled 2022-03-26: qty 1

## 2022-03-26 MED ORDER — ALBUTEROL SULFATE (2.5 MG/3ML) 0.083% IN NEBU: 2.5000 mg | INHALATION_SOLUTION | RESPIRATORY_TRACT | Status: DC | PRN

## 2022-03-26 MED ORDER — FENTANYL CITRATE PF 50 MCG/ML IJ SOSY
50.0000 ug | PREFILLED_SYRINGE | Freq: Once | INTRAMUSCULAR | Status: AC
  Administered 2022-03-26: 50 ug via INTRAVENOUS

## 2022-03-26 MED ORDER — SODIUM CHLORIDE 0.9 % IR SOLN
Status: DC | PRN
  Administered 2022-03-26: 1

## 2022-03-26 MED ORDER — OXYMETAZOLINE HCL 0.05 % NA SOLN
NASAL | Status: DC | PRN
  Administered 2022-03-26: 1 via TOPICAL

## 2022-03-26 MED ORDER — EPHEDRINE SULFATE (PRESSORS) 50 MG/ML IJ SOLN
INTRAMUSCULAR | Status: DC | PRN
  Administered 2022-03-26 (×2): 10 mg via INTRAVENOUS

## 2022-03-26 MED ORDER — FENTANYL CITRATE PF 50 MCG/ML IJ SOSY: 50.0000 ug | PREFILLED_SYRINGE | INTRAMUSCULAR | Status: DC | PRN

## 2022-03-26 MED ORDER — FUROSEMIDE 10 MG/ML IJ SOLN
INTRAMUSCULAR | Status: DC | PRN
  Administered 2022-03-26: 10 mg via INTRAMUSCULAR

## 2022-03-26 MED ORDER — MIDAZOLAM HCL 2 MG/2ML IJ SOLN: 2.0000 mg | INTRAMUSCULAR | Status: DC | PRN

## 2022-03-26 MED ORDER — ORAL CARE MOUTH RINSE: 15.0000 mL | OROMUCOSAL | Status: DC | PRN

## 2022-03-26 MED ORDER — ROCURONIUM BROMIDE 100 MG/10ML IV SOLN
INTRAVENOUS | Status: DC | PRN
  Administered 2022-03-26: 40 mg via INTRAVENOUS

## 2022-03-26 MED ORDER — FENTANYL CITRATE (PF) 100 MCG/2ML IJ SOLN
INTRAMUSCULAR | Status: AC
  Filled 2022-03-26: qty 2

## 2022-03-26 MED ORDER — ONDANSETRON HCL 4 MG/2ML IJ SOLN: 4.0000 mg | INTRAMUSCULAR | Status: DC | PRN

## 2022-03-26 MED ORDER — POLYETHYLENE GLYCOL 3350 17 G PO PACK
17.0000 g | PACK | Freq: Every day | ORAL | Status: DC
  Filled 2022-03-26: qty 1

## 2022-03-26 MED ORDER — PROPOFOL 10 MG/ML IV BOLUS
INTRAVENOUS | Status: AC
Start: 2022-03-26 — End: ?
  Filled 2022-03-26: qty 20

## 2022-03-26 MED ORDER — ONDANSETRON HCL 4 MG/2ML IJ SOLN
INTRAMUSCULAR | Status: AC
Start: 2022-03-26 — End: ?
  Filled 2022-03-26: qty 2

## 2022-03-26 MED ORDER — DEXAMETHASONE SODIUM PHOSPHATE 4 MG/ML IJ SOLN
INTRAMUSCULAR | Status: DC | PRN
  Administered 2022-03-26: 10 mg via INTRAVENOUS

## 2022-03-26 MED ORDER — RACEPINEPHRINE HCL 2.25 % IN NEBU
INHALATION_SOLUTION | RESPIRATORY_TRACT | Status: AC
  Filled 2022-03-26: qty 0.5

## 2022-03-26 MED ORDER — FENTANYL CITRATE (PF) 100 MCG/2ML IJ SOLN
INTRAMUSCULAR | Status: DC | PRN
  Administered 2022-03-26 (×2): 50 ug via INTRAVENOUS
  Administered 2022-03-26: 100 ug via INTRAVENOUS

## 2022-03-26 MED ORDER — SODIUM CHLORIDE 0.9 % IV SOLN
3.0000 g | Freq: Four times a day (QID) | INTRAVENOUS | Status: DC
  Administered 2022-03-26 – 2022-03-30 (×15): 3 g via INTRAVENOUS
  Filled 2022-03-26 (×15): qty 8

## 2022-03-26 MED ORDER — SUCCINYLCHOLINE CHLORIDE 200 MG/10ML IV SOSY
PREFILLED_SYRINGE | INTRAVENOUS | Status: DC | PRN
  Administered 2022-03-26: 140 mg via INTRAVENOUS

## 2022-03-26 MED ORDER — LIDOCAINE HCL (CARDIAC) PF 100 MG/5ML IV SOSY
PREFILLED_SYRINGE | INTRAVENOUS | Status: DC | PRN
  Administered 2022-03-26: 60 mg via INTRAVENOUS

## 2022-03-26 MED ORDER — ONDANSETRON HCL 4 MG PO TABS: 4.0000 mg | ORAL_TABLET | ORAL | Status: DC | PRN

## 2022-03-26 MED ORDER — FENTANYL BOLUS VIA INFUSION: 50.0000 ug | INTRAVENOUS | Status: DC | PRN

## 2022-03-26 MED ORDER — ROCURONIUM BROMIDE 10 MG/ML (PF) SYRINGE
PREFILLED_SYRINGE | INTRAVENOUS | Status: AC
  Filled 2022-03-26: qty 10

## 2022-03-26 MED ORDER — MIDAZOLAM HCL 2 MG/2ML IJ SOLN
INTRAMUSCULAR | Status: AC
  Filled 2022-03-26: qty 2

## 2022-03-26 MED ORDER — SODIUM CHLORIDE 0.9 % IV SOLN: INTRAVENOUS | Status: DC

## 2022-03-26 MED ORDER — PANTOPRAZOLE 2 MG/ML SUSPENSION
40.0000 mg | Freq: Every day | ORAL | Status: DC
  Administered 2022-03-26: 40 mg
  Filled 2022-03-26 (×2): qty 20

## 2022-03-26 MED ORDER — FENTANYL 2500MCG IN NS 250ML (10MCG/ML) PREMIX INFUSION
50.0000 ug/h | INTRAVENOUS | Status: DC
  Administered 2022-03-26: 100 ug/h via INTRAVENOUS
  Administered 2022-03-27: 150 ug/h via INTRAVENOUS
  Filled 2022-03-26 (×2): qty 250

## 2022-03-26 MED ORDER — DOCUSATE SODIUM 100 MG PO CAPS: 100.0000 mg | ORAL_CAPSULE | Freq: Two times a day (BID) | ORAL | Status: DC | PRN

## 2022-03-26 MED ORDER — CEFAZOLIN SODIUM-DEXTROSE 2-4 GM/100ML-% IV SOLN
INTRAVENOUS | Status: AC
  Filled 2022-03-26: qty 100

## 2022-03-26 MED ORDER — OXYMETAZOLINE HCL 0.05 % NA SOLN
NASAL | Status: AC
  Filled 2022-03-26: qty 30

## 2022-03-26 MED ORDER — ALBUMIN HUMAN 5 % IV SOLN: INTRAVENOUS | Status: DC | PRN

## 2022-03-26 MED ORDER — HYDROMORPHONE HCL 1 MG/ML IJ SOLN: 0.2500 mg | INTRAMUSCULAR | Status: DC | PRN

## 2022-03-26 MED ORDER — CEFAZOLIN SODIUM-DEXTROSE 1-4 GM/50ML-% IV SOLN
INTRAVENOUS | Status: AC
  Filled 2022-03-26: qty 50

## 2022-03-26 MED ORDER — MAGNESIUM SULFATE 4 GM/100ML IV SOLN
4.0000 g | Freq: Once | INTRAVENOUS | Status: AC
  Administered 2022-03-26: 4 g via INTRAVENOUS
  Filled 2022-03-26: qty 100

## 2022-03-26 MED ORDER — ALBUTEROL SULFATE HFA 108 (90 BASE) MCG/ACT IN AERS
INHALATION_SPRAY | RESPIRATORY_TRACT | Status: DC | PRN
  Administered 2022-03-26: 3 via RESPIRATORY_TRACT

## 2022-03-26 MED ORDER — IPRATROPIUM-ALBUTEROL 0.5-2.5 (3) MG/3ML IN SOLN
3.0000 mL | Freq: Four times a day (QID) | RESPIRATORY_TRACT | Status: DC
  Administered 2022-03-26 – 2022-03-28 (×8): 3 mL via RESPIRATORY_TRACT
  Filled 2022-03-26 (×8): qty 3

## 2022-03-26 MED ORDER — LIDOCAINE-EPINEPHRINE 1 %-1:100000 IJ SOLN
INTRAMUSCULAR | Status: DC | PRN
  Administered 2022-03-26: 7 mL

## 2022-03-26 MED ORDER — INSULIN ASPART 100 UNIT/ML IJ SOLN
0.0000 [IU] | INTRAMUSCULAR | Status: DC
  Administered 2022-03-26 – 2022-03-27 (×4): 3 [IU] via SUBCUTANEOUS
  Administered 2022-03-27 (×3): 2 [IU] via SUBCUTANEOUS
  Administered 2022-03-27 – 2022-03-28 (×3): 3 [IU] via SUBCUTANEOUS
  Administered 2022-03-29: 2 [IU] via SUBCUTANEOUS
  Administered 2022-03-29: 5 [IU] via SUBCUTANEOUS
  Administered 2022-03-30 (×2): 2 [IU] via SUBCUTANEOUS

## 2022-03-26 MED ORDER — PHENYLEPHRINE HCL (PRESSORS) 10 MG/ML IV SOLN
INTRAVENOUS | Status: DC | PRN
  Administered 2022-03-26: 80 ug via INTRAVENOUS
  Administered 2022-03-26: 160 ug via INTRAVENOUS
  Administered 2022-03-26: 240 ug via INTRAVENOUS
  Administered 2022-03-26: 80 ug via INTRAVENOUS
  Administered 2022-03-26: 160 ug via INTRAVENOUS

## 2022-03-26 MED ORDER — FUROSEMIDE 10 MG/ML IJ SOLN
40.0000 mg | Freq: Three times a day (TID) | INTRAMUSCULAR | Status: DC
  Administered 2022-03-26 – 2022-03-27 (×3): 40 mg via INTRAVENOUS
  Filled 2022-03-26 (×3): qty 4

## 2022-03-26 MED ORDER — LACTATED RINGERS IV SOLN: INTRAVENOUS | Status: DC

## 2022-03-26 MED ORDER — ONDANSETRON HCL 4 MG/2ML IJ SOLN
INTRAMUSCULAR | Status: DC | PRN
  Administered 2022-03-26: 4 mg via INTRAVENOUS

## 2022-03-26 MED ORDER — CEFAZOLIN SODIUM-DEXTROSE 2-4 GM/100ML-% IV SOLN
2.0000 g | INTRAVENOUS | Status: AC
  Administered 2022-03-26: 3 g via INTRAVENOUS

## 2022-03-26 MED ORDER — FUROSEMIDE 10 MG/ML IJ SOLN: 40.0000 mg | Freq: Once | INTRAMUSCULAR | Status: DC

## 2022-03-26 MED ORDER — MUPIROCIN 2 % EX OINT
TOPICAL_OINTMENT | CUTANEOUS | Status: AC
Start: 2022-03-26 — End: ?
  Filled 2022-03-26: qty 22

## 2022-03-26 MED ORDER — MIDAZOLAM HCL 5 MG/5ML IJ SOLN
INTRAMUSCULAR | Status: DC | PRN
  Administered 2022-03-26: 2 mg via INTRAVENOUS

## 2022-03-26 MED ORDER — POLYETHYLENE GLYCOL 3350 17 G PO PACK: 17.0000 g | PACK | Freq: Every day | ORAL | Status: DC | PRN

## 2022-03-26 MED ORDER — SODIUM CHLORIDE 0.9 % IV SOLN: INTRAVENOUS | Status: DC | PRN

## 2022-03-26 MED ORDER — ORAL CARE MOUTH RINSE
15.0000 mL | OROMUCOSAL | Status: DC
  Administered 2022-03-26 – 2022-03-27 (×9): 15 mL via OROMUCOSAL

## 2022-03-26 MED ORDER — LIDOCAINE-EPINEPHRINE 1 %-1:100000 IJ SOLN
INTRAMUSCULAR | Status: AC
  Filled 2022-03-26: qty 1

## 2022-03-26 MED ORDER — PHENYLEPHRINE 80 MCG/ML (10ML) SYRINGE FOR IV PUSH (FOR BLOOD PRESSURE SUPPORT)
PREFILLED_SYRINGE | INTRAVENOUS | Status: AC
  Filled 2022-03-26: qty 10

## 2022-03-26 MED ORDER — PROPOFOL 10 MG/ML IV BOLUS
INTRAVENOUS | Status: DC | PRN
  Administered 2022-03-26: 200 mg via INTRAVENOUS

## 2022-03-26 MED ORDER — PROPOFOL 500 MG/50ML IV EMUL
INTRAVENOUS | Status: DC | PRN
  Administered 2022-03-26: 50 mg via INTRAVENOUS

## 2022-03-26 MED ORDER — EPHEDRINE 5 MG/ML INJ
INTRAVENOUS | Status: AC
  Filled 2022-03-26: qty 5

## 2022-03-26 MED ORDER — DOCUSATE SODIUM 50 MG/5ML PO LIQD
100.0000 mg | Freq: Two times a day (BID) | ORAL | Status: DC
  Administered 2022-03-26: 100 mg
  Filled 2022-03-26 (×2): qty 10

## 2022-03-26 MED ORDER — PROPOFOL 1000 MG/100ML IV EMUL
0.0000 ug/kg/min | INTRAVENOUS | Status: DC
  Administered 2022-03-26 (×4): 50 ug/kg/min via INTRAVENOUS
  Administered 2022-03-27: 40 ug/kg/min via INTRAVENOUS
  Administered 2022-03-27: 50 ug/kg/min via INTRAVENOUS
  Filled 2022-03-26: qty 100
  Filled 2022-03-26: qty 300
  Filled 2022-03-26: qty 100
  Filled 2022-03-26: qty 300
  Filled 2022-03-26: qty 100

## 2022-03-26 MED ORDER — ALBUMIN HUMAN 5 % IV SOLN
INTRAVENOUS | Status: AC
  Filled 2022-03-26: qty 250

## 2022-03-26 MED ORDER — RACEPINEPHRINE HCL 2.25 % IN NEBU
INHALATION_SOLUTION | RESPIRATORY_TRACT | Status: DC | PRN
  Administered 2022-03-26: .5 mL via RESPIRATORY_TRACT

## 2022-03-26 MED ORDER — SUGAMMADEX SODIUM 500 MG/5ML IV SOLN
INTRAVENOUS | Status: DC | PRN
  Administered 2022-03-26: 300 mg via INTRAVENOUS
  Administered 2022-03-26: 200 mg via INTRAVENOUS

## 2022-03-26 MED ORDER — LACTATED RINGERS IV SOLN: INTRAVENOUS | Status: DC | PRN

## 2022-03-26 MED ORDER — LIDOCAINE 2% (20 MG/ML) 5 ML SYRINGE
INTRAMUSCULAR | Status: AC
  Filled 2022-03-26: qty 5

## 2022-03-26 SURGICAL SUPPLY — 30 items
BLADE INF TURB ROT M4 2 5PK (BLADE) ×1 IMPLANT
BLADE TRICUT ROTATE M4 4 5PK (BLADE) IMPLANT
CANISTER SUCT 1200ML W/VALVE (MISCELLANEOUS) ×3 IMPLANT
CATH ROBINSON RED A/P 14FR (CATHETERS) ×1 IMPLANT
COAGULATOR SUCT 8FR VV (MISCELLANEOUS) ×1 IMPLANT
COAGULATOR SUCT SWTCH 10FR 6 (ELECTROSURGICAL) ×1 IMPLANT
ELECT REM PT RETURN 9FT ADLT (ELECTROSURGICAL) ×3
ELECTRODE REM PT RTRN 9FT ADLT (ELECTROSURGICAL) IMPLANT
GLOVE BIO SURGEON STRL SZ 6.5 (GLOVE) ×1 IMPLANT
GLOVE BIO SURGEON STRL SZ7.5 (GLOVE) ×3 IMPLANT
GLOVE BIOGEL PI IND STRL 7.0 (GLOVE) IMPLANT
GLOVE BIOGEL PI INDICATOR 7.0 (GLOVE) ×2
GOWN STRL REUS W/ TWL LRG LVL3 (GOWN DISPOSABLE) ×2 IMPLANT
GOWN STRL REUS W/TWL LRG LVL3 (GOWN DISPOSABLE) ×6
HEMOSTAT SURGICEL .5X2 ABSORB (HEMOSTASIS) IMPLANT
IV NS 500ML (IV SOLUTION) ×3
IV NS 500ML BAXH (IV SOLUTION) ×2 IMPLANT
NDL HYPO 27GX1-1/4 (NEEDLE) ×2 IMPLANT
NEEDLE HYPO 27GX1-1/4 (NEEDLE) ×6 IMPLANT
NS IRRIG 1000ML POUR BTL (IV SOLUTION) ×1 IMPLANT
PACK BASIN DAY SURGERY FS (CUSTOM PROCEDURE TRAY) ×3 IMPLANT
PACK ENT DAY SURGERY (CUSTOM PROCEDURE TRAY) ×3 IMPLANT
PATTIES SURGICAL .5 X3 (DISPOSABLE) ×3 IMPLANT
SPIKE FLUID TRANSFER (MISCELLANEOUS) IMPLANT
SPONGE GAUZE 2X2 8PLY STRL LF (GAUZE/BANDAGES/DRESSINGS) ×3 IMPLANT
SUT ETHILON 3 0 PS 1 (SUTURE) IMPLANT
SYR BULB EAR ULCER 3OZ GRN STR (SYRINGE) ×1 IMPLANT
TOWEL GREEN STERILE FF (TOWEL DISPOSABLE) ×3 IMPLANT
TUBE CONNECTING 20X1/4 (TUBING) ×3 IMPLANT
YANKAUER SUCT BULB TIP NO VENT (SUCTIONS) ×3 IMPLANT

## 2022-03-26 NOTE — Brief Op Note (Signed)
03/26/2022  11:10 AM  PATIENT:  Luis Beard  41 y.o. male  PRE-OPERATIVE DIAGNOSIS:  Obstructive sleep apnea, Nasal turbinate hypertrophy  POST-OPERATIVE DIAGNOSIS:  Obstructive sleep apnea, Nasal turbinate hypertrophy, septal deviation, adenoid hypertrophy  PROCEDURE:  Procedure(s): TURBINATE REDUCTION/SUBMUCOSAL RESECTION (Bilateral) ADENOIDECTOMY (N/A) SEPTOPLASTY  SURGEON:  Surgeon(s) and Role:    Christia Reading, MD - Primary  PHYSICIAN ASSISTANT:   ASSISTANTS: none   ANESTHESIA:   general  EBL:  Minimal   BLOOD ADMINISTERED:none  DRAINS: none   LOCAL MEDICATIONS USED:  LIDOCAINE   SPECIMEN:  No Specimen  DISPOSITION OF SPECIMEN:  N/A  COUNTS:  YES  TOURNIQUET:  * No tourniquets in log *  DICTATION: .Note written in EPIC  PLAN OF CARE: Admit for overnight observation  PATIENT DISPOSITION:  PACU - hemodynamically stable.   Delay start of Pharmacological VTE agent (>24hrs) due to surgical blood loss or risk of bleeding: yes

## 2022-03-26 NOTE — Progress Notes (Signed)
Belongings given to patient's family after procedure prior to patient's transfer to [facility].

## 2022-03-26 NOTE — Anesthesia Preprocedure Evaluation (Addendum)
Anesthesia Evaluation  Patient identified by MRN, date of birth, ID band Patient awake    Reviewed: Allergy & Precautions, NPO status , Patient's Chart, lab work & pertinent test results  Airway Mallampati: II  TM Distance: >3 FB     Dental   Pulmonary asthma , sleep apnea , former smoker,    breath sounds clear to auscultation       Cardiovascular hypertension,  Rhythm:Regular Rate:Normal     Neuro/Psych    GI/Hepatic negative GI ROS, Neg liver ROS,   Endo/Other  negative endocrine ROS  Renal/GU negative Renal ROS     Musculoskeletal   Abdominal   Peds  Hematology   Anesthesia Other Findings   Reproductive/Obstetrics                             Anesthesia Physical Anesthesia Plan  ASA: 3  Anesthesia Plan: General   Post-op Pain Management:    Induction: Intravenous  PONV Risk Score and Plan: 2 and Ondansetron, Dexamethasone and Midazolam  Airway Management Planned: Oral ETT  Additional Equipment:   Intra-op Plan:   Post-operative Plan: Extubation in OR  Informed Consent: I have reviewed the patients History and Physical, chart, labs and discussed the procedure including the risks, benefits and alternatives for the proposed anesthesia with the patient or authorized representative who has indicated his/her understanding and acceptance.     Dental advisory given  Plan Discussed with: CRNA and Anesthesiologist  Anesthesia Plan Comments:        Anesthesia Quick Evaluation

## 2022-03-26 NOTE — Consult Note (Signed)
NAME:  Luis Beard, MRN:  [id number], DOB:  1981-04-05, LOS: 0 ADMISSION DATE:  03/26/2022 CONSULTATION DATE:  03/26/2022 REFERRING MD:  Jenne Pane - ENT CHIEF COMPLAINT:  Respiratory failure s/p procedure   History of Present Illness:  41 year old man who presented to Day Surgery 7/26 for planned bilateral turbinate reduction, septoplasty and adenoidectomy with ENT (Dr. Jenne Pane). PMHx significant for HTN, nasal turbinate hypertrophy, OSA refractory to medical therapy, history of tobacco use.   Patient presented to Day Surgery 7/26 for ENT procedure. Intraoperative course was notable for difficult intubation with Grade IV view. Postoperatively during anesthesia wake up, patient had a drop in O2 saturations and required additional ventilatory support. Racemic epinephrine and nebs administered. Decision to leave patient intubated.  PCCM consulted for ventilator management/ICU admission.  Pertinent Medical History:   Past Medical History:  Diagnosis Date   Asthma    Hypertension    Sleep apnea    Significant Hospital Events: Including procedures, antibiotic start and stop dates in addition to other pertinent events   7/26 - Underwent planned bilateral turbinate reduction, septoplasty and adenoidectomy with ENT Jenne Pane). Difficult intubation. Post-procedure hypoxia, left intubated. Transferred [facility]. PCCM consulted.  Interim History / Subjective:  As above  Objective:  Blood pressure (S) (!) 149/89, pulse 71, temperature 97.6 F (36.4 C), temperature source Oral, resp. rate 18, height 5\' 9"  (1.753 m), weight (!) 142 kg, SpO2 93 %.    Vent Mode: PRVC FiO2 (%):  [100 %] 100 % Set Rate:  [18 bmp] 18 bmp Vt Set:  [570 mL] 570 mL PEEP:  [12 cmH20] 12 cmH20 Plateau Pressure:  [29 cmH20] 29 cmH20   Intake/Output Summary (Last 24 hours) at 03/26/2022 1359 Last data filed at 03/26/2022 1236 Gross per 24 hour  Intake 1600 ml  Output 100 ml  Net 1500 ml   Filed Weights    03/26/22 0830 03/26/22 1219  Weight: (!) 142.5 kg (!) 142 kg   Physical Examination: General: Acutely ill-appearing young man in NAD. Intubated, sedated. HEENT: [place], bright red oozing blood from bilateral nares s/p turbinate reduction; gauze in place. Anicteric sclera, PERRL, moist mucous membranes. ETT in place + bite block. Neuro: Sedated. Responds to noxious stimuli. Withdraws to pain in all 4 extremities. Not following commands. Moves all 4 extremities spontaneously. Strength 5/5 in all 4 extremities. +Cough and +Gag  CV: RRR, no m/g/r. PULM: Breathing even and unlabored on vent (PEEP 16, FiO2 100%). Lung fields diminished at bases bilaterally no significant rales/rhonchi/wheezing. GI: Soft, nontender, nondistended. Normoactive bowel sounds. Extremities: No LE edema noted. Skin: Warm/dry, no rashes.  Resolved Hospital Problem List:    Assessment & Plan:   Acute hypoxemic respiratory failure Concern for possible aspiration - Continue full vent support (4-8cc/kg IBW); high PEEP to allow for re-recruitment - Daily WUA/SBT, currently precluded by high ventilator requirements - VAP bundle - Pulmonary hygiene - Bronchodilators as indicated (Albuterol, Atrovent) - PAD protocol for sedation: Propofol, Fentanyl, and Versed for goal RASS  -4 to -5 - Diuresis as tolerated, Lasix 40mg  IV x 1 - F/u CXR - Empiric coverage with Unasyn  Bilateral nasal turbinate hypertrophy S/p turbinate reduction, septoplasty, adenoidectomy 7/26 OSA History of tobacco use Turbinate hypertrophy and PSA refractory to medical therapy. Home meds include: albuterol, Flonase, Advair). S/p above ENT procedures with Dr. 7/26. - Post-procedure management per ENT - CPAP QHS once extubated  HTN Home regimen: amlodipine 10mg , HCTZ 25mg , valsartan 320mg  - Diuresis as above - F/u BNP -  Hold home medications for now - Resume as clinically appropriate - Cardiac monitoring  Best Practice: (right click and  "Reselect all SmartList Selections" daily)   Diet/type: NPO DVT prophylaxis: SCDs, hold AC in the immediate postoperative period GI prophylaxis: PPI Lines: N/A Foley:  N/A Code Status:  full code Last date of multidisciplinary goals of care discussion [Pending]  Labs:  CBC: No results for input(s): "WBC", "NEUTROABS", "HGB", "HCT", "MCV", "PLT" in the last 168 hours.  Basic Metabolic Panel: Recent Labs  Lab 03/24/22 1600  NA 137  K 4.0  CL 93*  CO2 33*  GLUCOSE 123*  BUN 6  CREATININE 1.11  CALCIUM 8.7*   GFR: Estimated Creatinine Clearance: 126.6 mL/min (by C-G formula based on SCr of 1.11 mg/dL). No results for input(s): "PROCALCITON", "WBC", "LATICACIDVEN" in the last 168 hours.  Liver Function Tests: No results for input(s): "AST", "ALT", "ALKPHOS", "BILITOT", "PROT", "ALBUMIN" in the last 168 hours. No results for input(s): "LIPASE", "AMYLASE" in the last 168 hours. No results for input(s): "AMMONIA" in the last 168 hours.  ABG: No results found for: "PHART", "PCO2ART", "PO2ART", "HCO3", "TCO2", "ACIDBASEDEF", "O2SAT"   Coagulation Profile: No results for input(s): "INR", "PROTIME" in the last 168 hours.  Cardiac Enzymes: No results for input(s): "CKTOTAL", "CKMB", "CKMBINDEX", "TROPONINI" in the last 168 hours.  HbA1C: No results found for: "HGBA1C"  CBG: No results for input(s): "GLUCAP" in the last 168 hours.  Review of Systems:   Patient is encephalopathic and/or intubated. Therefore history has been obtained from chart review.   Past Medical History:  He,  has a past medical history of Asthma, Hypertension, and Sleep apnea.   Surgical History:  History reviewed. No pertinent surgical history.   Social History:   reports that he quit smoking about 18 months ago. His smoking use included cigars. He has never used smokeless tobacco. He reports current alcohol use. He reports current drug use. Frequency: 7.00 times per week. Drug: Marijuana.    Family History:  His family history includes Diabetes in his mother; Hypertension in his mother.   Allergies: No Known Allergies   Home Medications: Prior to Admission medications   Medication Sig Start Date End Date Taking? Authorizing Provider  amLODipine (NORVASC) 10 MG tablet Take 1 tablet (10 mg total) by mouth daily. 01/30/22  Yes Georganna Skeans, MD  chlorpheniramine-HYDROcodone 10-8 MG/5ML TAKE 1 TEASPOONFUL EVERY 12 HOURS AS NEEDED 11/27/15  Yes [provider]  hydrochlorothiazide (HYDRODIURIL) 25 MG tablet Take 1 tablet (25 mg total) by mouth daily. 01/30/22  Yes Georganna Skeans, MD  valsartan (DIOVAN) 320 MG tablet Take 1 tablet (320 mg total) by mouth daily. 01/30/22  Yes Georganna Skeans, MD  albuterol (VENTOLIN HFA) 108 (90 Base) MCG/ACT inhaler Inhale 1-2 puffs into the lungs every 6 (six) hours as needed for wheezing or shortness of breath. 06/05/20   Hall-Potvin, [place], PA-C  fluticasone (FLONASE) 50 MCG/ACT nasal spray Place into the nose. 12/04/20   [provider]  fluticasone-salmeterol (ADVAIR DISKUS) 500-50 MCG/ACT AEPB Inhale 1 puff into the lungs in the morning and at bedtime. 12/19/21   Georganna Skeans, MD  naproxen (NAPROSYN) 500 MG tablet Take by mouth. 01/07/15   [provider]  cetirizine (ZYRTEC ALLERGY) 10 MG tablet Take 1 tablet (10 mg total) by mouth daily. Patient not taking: Reported on 12/27/2019 01/19/19 12/27/19  Belinda Fisher, PA-C  ipratropium (ATROVENT) 0.06 % nasal spray Place 2 sprays into both nostrils 4 (four) times daily. 12/27/19 06/05/20  Belinda Fisher, PA-C    Critical care time: 38 minutes   The patient is critically ill with multiple organ system failure and requires high complexity decision making for assessment and support, frequent evaluation and titration of therapies, advanced monitoring, review of radiographic studies and interpretation of complex data.   Critical Care Time devoted to patient care services, exclusive of  separately billable procedures, described in this note is 38 minutes.  Tim Lair, PA-C [facility] 03/26/22 1:59 PM  Please see [email or web address] for pager details.  From 7A-7P if no response, please call [phone] After hours, please call [facility]Link [phone]

## 2022-03-26 NOTE — Op Note (Addendum)
PREOPERATIVE DIAGNOSIS:  Turbinate hypertrophy, obstructive sleep apnea   POSTOPERATIVE DIAGNOSIS:  Turbinate hypertrophy, obstructive sleep apnea, septal deviation, and adenoid hypertrophy   PROCEDURE:  Bilateral turbinate reduction, septoplasty, adenoidectomy   SURGEON:  Christia Reading, MD   ANESTHESIA:  General endotracheal anesthesia   COMPLICATIONS:  None   INDICATIONS:  The patient is a 41 year old male with a history of obstructive sleep apnea who has been using CPAP but having difficulty with nasal obstruction due to turbinate hypertrophy not responding to medical therapy.  He presents to the operating room for surgical management.   FINDINGS:  Enlarged turbinates.  After reduction of the turbinates, he was noted to have a severe leftward septal spur and nasopharyngeal obstruction by adenoid tissue.  The spur was removed via limited septoplasty and the adenoid tissue was largely removed.  It was unclear if some of the tissue represented the tori tubarius so some tissue was left in place laterally.   DESCRIPTION OF PROCEDURE:  The patient was identified in the holding room, informed consent having been obtained including discussion of risks, benefits and alternatives, the patient was brought to the operative suite and put the operative table in the supine position.  Anesthesia was induced and the patient was intubated by the anesthesia team without difficulty.  The eyes were taped closed.  The patient was given intravenous antibiotics during the case.  The nose was packed in both sides with Afrin-soaked pledgets for several minutes that were then removed.  The inferior turbinates were injected with local anesthetic.  An incision was made at the anterior head of each inferior turbinate and soft tissues were elevated off of the underlying bone using a freer elevator.  The microdebrider was then used with the turbinate blade to remove submucosal tissue keeping the overlying mucosa and underlying  bone intact.  Soft tissues were then redraped and the turbinates out-fractured.  The nasal passage was suctioned.  The septum was found to have a severe leftward spur and the nasopharynx appeared obstructed by adenoid tissue.  I discussed the findings with his wife by phone and we agreed to proceed with septoplasty and adenoidectomy.   The septum was then injected with local anesthetic on the left side anterior to the spur.  A vertical incision was made anterior to the spur and soft tissues were then elevated off of the underlying cartilage and bone.  The cartilage and bone were divided anterior to the spur and soft tissues were elevated from the opposite side.  The spur was then removed in a piecemeal fashion using forceps.  The right-sided septal soft tissues were kept intact.  Left-sided soft tissues were redraped.  Afrin-soaked pledgets were placed in both sides.  The bed was turned 90 degrees from anesthesia.  The oropharynx was exposed with a Crow-Davis retractor that was placed in suspension on the Mayo stand.   A red rubber catheter was passed through the right nasal passage and pulled through the mouth to provide anterior retraction on the soft palate.  A laryngeal mirror was inserted to view the nasopharynx.  Adenoid tissue was then removed using the suction cautery taking care to avoid damage to the eustachian tube openings, turbinates, or vomer.  Some tissue was maintained laterally on each side as the tori tubarius were not seen otherwise.  After this was completed, the red rubber catheter was removed and the mouth and nose were copiously irrigated with saline.  The Crow-Davis retractor was taken out of suspension and removed from the  patient's mouth.  He was then turned back to anesthesia for wake-up.  Pledgets were left in the nose for wake-up.  Drapes were removed and the patient was cleaned off.  The patient was returned to anesthesia for wakeup.  During emergence, his oxygen saturation fell and  he required additional support.  The endotracheal tube was not removed.  With this difficulty, the decision was made to keep him intubated and move him to the ICU for further assessment and potential weaning.  Pledgets were removed prior to transfer.

## 2022-03-26 NOTE — Anesthesia Procedure Notes (Signed)
Procedure Name: Intubation Date/Time: 03/26/2022 10:21 AM  Performed by: Maryella Shivers, CRNAPre-anesthesia Checklist: Patient identified, Emergency Drugs available, Suction available and Patient being monitored Patient Re-evaluated:Patient Re-evaluated prior to induction Oxygen Delivery Method: Circle system utilized Preoxygenation: Pre-oxygenation with 100% oxygen Induction Type: IV induction Ventilation: Mask ventilation with difficulty Laryngoscope Size: Mac and 4 Grade View: Grade IV Tube size: 7.5 mm Number of attempts: 1 Airway Equipment and Method: Stylet and Oral airway Placement Confirmation: ETT inserted through vocal cords under direct vision, positive ETCO2 and breath sounds checked- equal and bilateral Secured at: 21 cm Tube secured with: Tape Dental Injury: Teeth and Oropharynx as per pre-operative assessment

## 2022-03-26 NOTE — Progress Notes (Signed)
   ENT Progress Note: s/p Procedure(s): TURBINATE REDUCTION/SUBMUCOSAL RESECTION ADENOIDECTOMY SEPTOPLASTY   Subjective: Pt intubated and sedated  Objective: Vital signs in last 24 hours: Temp:  [97.6 F (36.4 C)-98.1 F (36.7 C)] 98.1 F (36.7 C) (07/26 1500) Pulse Rate:  [62-87] 63 (07/26 1607) Resp:  [18] 18 (07/26 1607) BP: (104-149)/(67-89) 104/67 (07/26 1500) SpO2:  [93 %-100 %] 98 % (07/26 1607) FiO2 (%):  [70 %-100 %] 70 % (07/26 1607) Weight:  [142 kg-142.5 kg] 142 kg (07/26 1219) Weight change:     Intake/Output from previous day: No intake/output data recorded. Intake/Output this shift: Total I/O In: 1665.4 [I.V.:1165.4; IV Piggyback:500] Out: 100 [Blood:100]  Labs: Recent Labs    03/26/22 1605  HGB 18.0*  HCT 53.0*   Recent Labs    03/24/22 1600 03/26/22 1444 03/26/22 1605  NA 137 135 134*  K 4.0 4.7 4.2  CL 93* 94*  --   CO2 33* 28  --   GLUCOSE 123* 133*  --   BUN 6 11  --   CALCIUM 8.7* 8.6*  --     Studies/Results: DG Chest Port 1 View  Result Date: 03/26/2022 CLINICAL DATA:  Hypoxia EXAM: PORTABLE CHEST 1 VIEW COMPARISON:  Portable exam 1344 hours compared to 02/06/2021 FINDINGS: Tip of endotracheal tube projects 6.7 cm above carina. Nasogastric tube extends into stomach. Enlargement of cardiac silhouette with slight vascular congestion. Atelectasis versus consolidation LEFT lower lobe increased since previous exam. Remaining lungs clear. No pneumothorax or acute osseous findings. IMPRESSION: Atelectasis versus consolidation LEFT lower lobe. Enlargement of cardiac silhouette with slight pulmonary vascular congestion. Electronically Signed   By: Ulyses Southward M.D.   On: 03/26/2022 13:59     PHYSICAL EXAM: Min bloody nasal d/c Airway stable   Assessment/Plan: Pt stable in ICU with vent support Cont care per CCM Plan wean and extubation in am   Luis Beard 03/26/2022, 5:12 PM

## 2022-03-26 NOTE — Anesthesia Postprocedure Evaluation (Signed)
Anesthesia Post Note  Patient: Luis Beard  Procedure(s) Performed: Frederik Schmidt REDUCTION/SUBMUCOSAL RESECTION (Bilateral: Nose) ADENOIDECTOMY (Throat) SEPTOPLASTY (Bilateral: Nose)     Patient location during evaluation: Other Anesthesia Type: General Level of consciousness: patient remains intubated per anesthesia plan Pain management: pain level controlled Vital Signs Assessment: post-procedure vital signs reviewed and stable Respiratory status: patient remains intubated per anesthesia plan Cardiovascular status: stable Postop Assessment: no apparent nausea or vomiting Anesthetic complications: no   No notable events documented.  Last Vitals:  Vitals:   03/26/22 1300 03/26/22 1500  BP:  104/67  Pulse: 71 62  Resp: 18 18  Temp:  36.7 C  SpO2: 93% 98%    Last Pain:  Vitals:   03/26/22 1500  TempSrc: Oral  PainSc:                  Sheba Whaling

## 2022-03-26 NOTE — H&P (Signed)
Luis Beard is an 41 y.o. male.   Chief Complaint: Turbinate hypertrophy and sleep apnea HPI: 41 year old male with obstructive sleep apnea using CPAP but having difficulty with nasal obstruction due to turbinate hypertrophy that has not responded to medical therapy.  Past Medical History:  Diagnosis Date   Asthma    Hypertension    Sleep apnea     History reviewed. No pertinent surgical history.  Family History  Problem Relation Age of Onset   Hypertension Mother    Diabetes Mother    Social History:  reports that he quit smoking about 18 months ago. His smoking use included cigars. He has never used smokeless tobacco. He reports current alcohol use. He reports current drug use. Frequency: 7.00 times per week. Drug: Marijuana.  Allergies: No Known Allergies  Medications Prior to Admission  Medication Sig Dispense Refill   amLODipine (NORVASC) 10 MG tablet Take 1 tablet (10 mg total) by mouth daily. 90 tablet 0   chlorpheniramine-HYDROcodone 10-8 MG/5ML TAKE 1 TEASPOONFUL EVERY 12 HOURS AS NEEDED     hydrochlorothiazide (HYDRODIURIL) 25 MG tablet Take 1 tablet (25 mg total) by mouth daily. 90 tablet 0   valsartan (DIOVAN) 320 MG tablet Take 1 tablet (320 mg total) by mouth daily. 90 tablet 0   albuterol (VENTOLIN HFA) 108 (90 Base) MCG/ACT inhaler Inhale 1-2 puffs into the lungs every 6 (six) hours as needed for wheezing or shortness of breath. 18 g 0   fluticasone (FLONASE) 50 MCG/ACT nasal spray Place into the nose.     fluticasone-salmeterol (ADVAIR DISKUS) 500-50 MCG/ACT AEPB Inhale 1 puff into the lungs in the morning and at bedtime. 1 each 0   naproxen (NAPROSYN) 500 MG tablet Take by mouth.      Results for orders placed or performed during the hospital encounter of 03/26/22 (from the past 48 hour(s))  Basic metabolic panel per protocol     Status: Abnormal   Collection Time: 03/24/22  4:00 PM  Result Value Ref Range   Sodium 137 135 - 145 mmol/L   Potassium 4.0 3.5  - 5.1 mmol/L   Chloride 93 (L) 98 - 111 mmol/L   CO2 33 (H) 22 - 32 mmol/L   Glucose, Bld 123 (H) 70 - 99 mg/dL    Comment: Glucose reference range applies only to samples taken after fasting for at least 8 hours.   BUN 6 6 - 20 mg/dL   Creatinine, Ser 7.61 0.61 - 1.24 mg/dL   Calcium 8.7 (L) 8.9 - 10.3 mg/dL   GFR, Estimated >95 >09 mL/min    Comment: (NOTE) Calculated using the CKD-EPI Creatinine Equation (2021)    Anion gap 11 5 - 15    Comment: Performed at [facility], [address] [place], [place] [address]   No results found.  Review of Systems  All other systems reviewed and are negative.   There were no vitals taken for this visit. Physical Exam Constitutional:      Appearance: He is obese.     Comments: Sleepy.  HENT:     Head: Normocephalic and atraumatic.     Right Ear: External ear normal.     Left Ear: External ear normal.     Nose: Nose normal.     Mouth/Throat:     Mouth: Mucous membranes are moist.     Pharynx: Oropharynx is clear.  Eyes:     Extraocular Movements: Extraocular movements intact.     Conjunctiva/sclera: Conjunctivae  normal.     Pupils: Pupils are equal, round, and reactive to light.  Cardiovascular:     Rate and Rhythm: Normal rate.  Pulmonary:     Effort: Pulmonary effort is normal.  Musculoskeletal:     Cervical back: Normal range of motion.  Skin:    General: Skin is warm and dry.  Neurological:     General: No focal deficit present.     Mental Status: He is oriented to person, place, and time.  Psychiatric:        Mood and Affect: Mood normal.        Behavior: Behavior normal.        Thought Content: Thought content normal.        Judgment: Judgment normal.      Assessment/Plan Turbinate hypertrophy and obstructive sleep apnea  To OR for turbinate reduction.  Plan overnight observation.  Christia Reading, MD 03/26/2022, 10:02 AM

## 2022-03-26 NOTE — Transfer of Care (Signed)
Immediate Anesthesia Transfer of Care Note  Patient: Luis Beard  Procedure(s) Performed: Frederik Schmidt REDUCTION/SUBMUCOSAL RESECTION (Bilateral: Nose) ADENOIDECTOMY (Throat) SEPTOPLASTY (Bilateral: Nose)  Patient Location: SICU  Anesthesia Type:General  Level of Consciousness: sedated  Airway & Oxygen Therapy: Patient remains intubated per anesthesia plan  Post-op Assessment: Report given to RN and Post -op Vital signs reviewed and stable  Post vital signs: Reviewed and stable  Last Vitals:  Vitals Value Taken Time  BP    Temp    Pulse    Resp    SpO2      Last Pain: There were no vitals filed for this visit.       Complications: No notable events documented.

## 2022-03-27 ENCOUNTER — Encounter (HOSPITAL_BASED_OUTPATIENT_CLINIC_OR_DEPARTMENT_OTHER): Payer: Self-pay | Admitting: Otolaryngology

## 2022-03-27 ENCOUNTER — Inpatient Hospital Stay (HOSPITAL_COMMUNITY): Payer: Commercial Managed Care - HMO

## 2022-03-27 DIAGNOSIS — J9601 Acute respiratory failure with hypoxia: Secondary | ICD-10-CM | POA: Diagnosis not present

## 2022-03-27 LAB — GLUCOSE, CAPILLARY
Glucose-Capillary: 116 mg/dL — ABNORMAL HIGH (ref 70–99)
Glucose-Capillary: 135 mg/dL — ABNORMAL HIGH (ref 70–99)
Glucose-Capillary: 142 mg/dL — ABNORMAL HIGH (ref 70–99)
Glucose-Capillary: 143 mg/dL — ABNORMAL HIGH (ref 70–99)
Glucose-Capillary: 158 mg/dL — ABNORMAL HIGH (ref 70–99)
Glucose-Capillary: 159 mg/dL — ABNORMAL HIGH (ref 70–99)

## 2022-03-27 LAB — CBC
HCT: 51.7 % (ref 39.0–52.0)
Hemoglobin: 14.6 g/dL (ref 13.0–17.0)
MCH: 21.4 pg — ABNORMAL LOW (ref 26.0–34.0)
MCHC: 28.2 g/dL — ABNORMAL LOW (ref 30.0–36.0)
MCV: 75.8 fL — ABNORMAL LOW (ref 80.0–100.0)
Platelets: 295 10*3/uL (ref 150–400)
RBC: 6.82 MIL/uL — ABNORMAL HIGH (ref 4.22–5.81)
RDW: 18.9 % — ABNORMAL HIGH (ref 11.5–15.5)
WBC: 10.3 10*3/uL (ref 4.0–10.5)
nRBC: 0.3 % — ABNORMAL HIGH (ref 0.0–0.2)

## 2022-03-27 LAB — BASIC METABOLIC PANEL
Anion gap: 11 (ref 5–15)
BUN: 22 mg/dL — ABNORMAL HIGH (ref 6–20)
CO2: 31 mmol/L (ref 22–32)
Calcium: 8.7 mg/dL — ABNORMAL LOW (ref 8.9–10.3)
Chloride: 94 mmol/L — ABNORMAL LOW (ref 98–111)
Creatinine, Ser: 2.36 mg/dL — ABNORMAL HIGH (ref 0.61–1.24)
GFR, Estimated: 35 mL/min — ABNORMAL LOW (ref >60)
Glucose, Bld: 135 mg/dL — ABNORMAL HIGH (ref 70–99)
Potassium: 4.5 mmol/L (ref 3.5–5.1)
Sodium: 136 mmol/L (ref 135–145)

## 2022-03-27 LAB — MAGNESIUM: Magnesium: 2.7 mg/dL — ABNORMAL HIGH (ref 1.7–2.4)

## 2022-03-27 LAB — TRIGLYCERIDES: Triglycerides: 187 mg/dL — ABNORMAL HIGH (ref <150)

## 2022-03-27 MED ORDER — POLYETHYLENE GLYCOL 3350 17 G PO PACK
17.0000 g | PACK | Freq: Every day | ORAL | Status: DC
  Filled 2022-03-27 (×4): qty 1

## 2022-03-27 MED ORDER — CHLORHEXIDINE GLUCONATE CLOTH 2 % EX PADS
6.0000 | MEDICATED_PAD | Freq: Every day | CUTANEOUS | Status: DC
Start: 2022-03-27 — End: 2022-03-30
  Administered 2022-03-27 – 2022-03-30 (×5): 6 via TOPICAL

## 2022-03-27 MED ORDER — ORAL CARE MOUTH RINSE: 15.0000 mL | OROMUCOSAL | Status: DC | PRN

## 2022-03-27 MED ORDER — PANTOPRAZOLE SODIUM 40 MG PO TBEC
40.0000 mg | DELAYED_RELEASE_TABLET | Freq: Every day | ORAL | Status: DC
  Administered 2022-03-27 – 2022-03-30 (×4): 40 mg via ORAL
  Filled 2022-03-27 (×4): qty 1

## 2022-03-27 MED ORDER — ACETAMINOPHEN-CODEINE 300-30 MG PO TABS
1.0000 | ORAL_TABLET | Freq: Four times a day (QID) | ORAL | Status: DC | PRN
  Administered 2022-03-27 – 2022-03-28 (×4): 1 via ORAL
  Filled 2022-03-27 (×5): qty 1

## 2022-03-27 MED ORDER — DOCUSATE SODIUM 100 MG PO CAPS
100.0000 mg | ORAL_CAPSULE | Freq: Two times a day (BID) | ORAL | Status: DC
  Administered 2022-03-27 – 2022-03-28 (×2): 100 mg via ORAL
  Filled 2022-03-27 (×7): qty 1

## 2022-03-27 MED ORDER — ACETAMINOPHEN 325 MG PO TABS: 650.0000 mg | ORAL_TABLET | Freq: Four times a day (QID) | ORAL | Status: DC | PRN

## 2022-03-27 NOTE — Procedures (Signed)
Extubation Procedure Note  Patient Details:   Name: Luis Beard DOB: 07-21-1981 MRN: [id number]   Airway Documentation:    Vent end date: 03/27/22 Vent end time: 0909   Evaluation  O2 sats: stable throughout Complications: No apparent complications Patient did tolerate procedure well. Bilateral Breath Sounds: Diminished   Yes  Pt extubated to 10L 60% face tent. No stridor noted, cuff leak present. Pt tolerated well, RN at bedside, MD aware, RT will monitor.   Orie Fisherman Marri Mcneff 03/27/2022, 9:09 AM

## 2022-03-27 NOTE — Progress Notes (Signed)
Subjective: Remains intubated.  Some bloody oozing from nose.  Objective: Vital signs in last 24 hours: Temp:  [97.6 F (36.4 C)-98.6 F (37 C)] 98.6 F (37 C) (07/27 0736) Pulse Rate:  [58-90] 90 (07/27 0700) Resp:  [16-18] 16 (07/27 0700) BP: (89-190)/(52-175) 190/175 (07/27 0700) SpO2:  [93 %-100 %] 94 % (07/27 0700) FiO2 (%):  [70 %-100 %] 70 % (07/27 0408) Weight:  [142 kg-150.6 kg] 150.6 kg (07/27 0600) Wt Readings from Last 1 Encounters:  03/27/22 (!) 150.6 kg    Intake/Output from previous day: 07/26 0701 - 07/27 0700 In: 3226.6 [I.V.:2326.8; IV Piggyback:899.8] Out: 300 [Urine:200; Blood:100] Intake/Output this shift: No intake/output data recorded.  General appearance: alert and intubated Nose: some bloody drainage from nose  Recent Labs    03/26/22 1655 03/27/22 0652  WBC 7.7 10.3  HGB 14.3 14.6  HCT 46.2 51.7  PLT 262 295    Recent Labs    03/26/22 2107 03/27/22 0652  NA 136 136  K 4.5 4.5  CL 94* 94*  CO2 30 31  GLUCOSE 142* 135*  BUN 16 22*  CREATININE 1.97* 2.36*  CALCIUM 8.6* 8.7*    Medications: I have reviewed the patient's current medications.  Assessment/Plan: S/p nasal surgery with difficult wake-up  Change drip pad as needed for expected nose bleeding.  Appreciate CCM care.  Remains intubated with some ventilator support.  Kidney function dipping.  Will follow along.   LOS: 1 day   Christia Reading 03/27/2022, 7:53 AM

## 2022-03-27 NOTE — Progress Notes (Signed)
eLink Physician-Brief Progress Note Patient Name: Luis Beard DOB: 1980/10/22 MRN: [id number]   Date of Service  03/27/2022  HPI/Events of Note  ETT appears to be 6 cm above the carina.  eICU Interventions  Plan: Advance ETT 2 cm. Repeat CXR post ETT repositioning.      Intervention Category Major Interventions: Other:  Jj Enyeart Dennard Nip 03/27/2022, 6:21 AM

## 2022-03-27 NOTE — Progress Notes (Signed)
NAME:  Luis Beard, MRN:  [id number], DOB:  04-07-1981, LOS: 1 ADMISSION DATE:  03/26/2022 CONSULTATION DATE:  03/26/2022 REFERRING MD:  Jenne Pane - ENT CHIEF COMPLAINT:  Respiratory failure s/p procedure   History of Present Illness:  41 year old man who presented to [facility] 7/26 for planned bilateral turbinate reduction, septoplasty and adenoidectomy with ENT (Dr. Jenne Pane). PMHx significant for HTN, nasal turbinate hypertrophy, OSA refractory to medical therapy, history of tobacco use.   Patient presented to Day Surgery 7/26 for ENT procedure. Intraoperative course was notable for difficult intubation with Grade IV view. Postoperatively during anesthesia wake up, patient had a drop in O2 saturations and required additional ventilatory support. Racemic epinephrine and nebs administered. Decision to leave patient intubated.  PCCM consulted for ventilator management/ICU admission.  Pertinent Medical History:   Past Medical History:  Diagnosis Date   Asthma    Hypertension    Sleep apnea    Significant Hospital Events: Including procedures, antibiotic start and stop dates in addition to other pertinent events   7/26 - Underwent planned bilateral turbinate reduction, septoplasty and adenoidectomy with ENT Jenne Pane). Difficult intubation. Post-procedure hypoxia, left intubated. Transferred to [facility]. PCCM consulted. 7/27 - minimal documented UOP. Worse renal fxn   Interim History / Subjective:  Cr  up to 2.2 Weaning vent  Objective:  Blood pressure (!) 190/175, pulse 94, temperature 98.6 F (37 C), temperature source Axillary, resp. rate 16, height 5\' 9"  (1.753 m), weight (!) 150.6 kg, SpO2 90 %.    Vent Mode: PSV;CPAP FiO2 (%):  [50 %-100 %] 60 % Set Rate:  [16 bmp-18 bmp] 16 bmp Vt Set:  [570 mL] 570 mL PEEP:  [5 cmH20-16 cmH20] 5 cmH20 Pressure Support:  [10 cmH20] 10 cmH20 Plateau Pressure:  [28 cmH20-30 cmH20] 28 cmH20   Intake/Output Summary (Last 24 hours) at  03/27/2022 0947 Last data filed at 03/27/2022 0700 Gross per 24 hour  Intake 3226.62 ml  Output 300 ml  Net 2926.62 ml   Filed Weights   03/26/22 0830 03/26/22 1219 03/27/22 0600  Weight: (!) 142.5 kg (!) 142 kg (!) 150.6 kg   Physical Examination: General: Obese adult M intubated NAD  HEENT: Nasal drip pad with bloody collection. ETT secure. Anicteric sclera  Neuro: Awake following commands no focal deficit  CV: rrr s1s2 cap refill brisk  PULM: even unlabored on PSV. Basilar crackles  GI: soft ndnt  Extremities: noa cute joint deformity  Skin: c/d/w   Resolved Hospital Problem List:    Assessment & Plan:   Acute hypoxemic respiratory failure Possible aspiration event  Pulmonary edema  Hx OSA Tobacco use disorder  -?difficult airway  P -Wean vent support as able -Wean sedation -hopefully extubate 7/27  -would like to give more lasix however need to bladder scan first, may need I/O  -BD  -qHS CPAP  Bilat inferior turbinate hypertrophy s/p submucosal reduction, septoplasty, adenoidectomy  Turbinate hypertrophy and PSA refractory to medical therapy. Home meds include: albuterol, Flonase, Advair). S/p above ENT procedures with Dr. 8/27 7/26. P - post-op per ENT   AKI  -?obstructive  P -Bladder scan, I/O  -hopefully diurese   HTN Home regimen: amlodipine 10mg , HCTZ 25mg , valsartan 320mg  P -resume home meds as appropriate post extubation    Best Practice: (right click and "Reselect all SmartList Selections" daily)   Diet/type: NPO DVT prophylaxis: SCDs, hold AC in the immediate postoperative period GI prophylaxis: PPI Lines: N/A Foley:  N/A Code Status:  full code Last  date of multidisciplinary goals of care discussion [7/27 wife and pt updated at bedside]  Labs:  CBC: Recent Labs  Lab 03/26/22 1605 03/26/22 1655 03/27/22 0652  WBC  --  7.7 10.3  HGB 18.0* 14.3 14.6  HCT 53.0* 46.2 51.7  MCV  --  75.2* 75.8*  PLT  --  262 295    Basic  Metabolic Panel: Recent Labs  Lab 03/24/22 1600 03/26/22 1444 03/26/22 1605 03/26/22 2107 03/27/22 0652  NA 137 135 134* 136 136  K 4.0 4.7 4.2 4.5 4.5  CL 93* 94*  --  94* 94*  CO2 33* 28  --  30 31  GLUCOSE 123* 133*  --  142* 135*  BUN 6 11  --  16 22*  CREATININE 1.11 1.60*  --  1.97* 2.36*  CALCIUM 8.7* 8.6*  --  8.6* 8.7*  MG  --  1.6*  --   --  2.7*  PHOS  --  2.0*  --   --   --    GFR: Estimated Creatinine Clearance: 61.6 mL/min (A) (by C-G formula based on SCr of 2.36 mg/dL (H)). Recent Labs  Lab 03/26/22 1655 03/27/22 0652  WBC 7.7 10.3    Liver Function Tests: Recent Labs  Lab 03/26/22 1444  AST 16  ALT 15  ALKPHOS 77  BILITOT 0.4  PROT 6.7  ALBUMIN 3.3*   No results for input(s): "LIPASE", "AMYLASE" in the last 168 hours. No results for input(s): "AMMONIA" in the last 168 hours.  ABG:    Component Value Date/Time   PHART 7.490 (H) 03/26/2022 1605   PCO2ART 46.1 03/26/2022 1605   PO2ART 128 (H) 03/26/2022 1605   HCO3 35.2 (H) 03/26/2022 1605   TCO2 37 (H) 03/26/2022 1605   O2SAT 99 03/26/2022 1605     Coagulation Profile: No results for input(s): "INR", "PROTIME" in the last 168 hours.  Cardiac Enzymes: No results for input(s): "CKTOTAL", "CKMB", "CKMBINDEX", "TROPONINI" in the last 168 hours.  HbA1C: Hgb A1c MFr Bld  Date/Time Value Ref Range Status  03/26/2022 02:44 PM 7.3 (H) 4.8 - 5.6 % Final    Comment:    (NOTE) Pre diabetes:          5.7%-6.4%  Diabetes:              >6.4%  Glycemic control for   <7.0% adults with diabetes     CBG: Recent Labs  Lab 03/26/22 1514 03/26/22 2001 03/26/22 2333 03/27/22 0420 03/27/22 0729  GLUCAP 152* 178* 151* 142* 159*   CRITICAL CARE Performed by: Lanier Clam   Total critical care time: 35 minutes  Critical care time was exclusive of separately billable procedures and treating other patients. Critical care was necessary to treat or prevent imminent or life-threatening  deterioration.  Critical care was time spent personally by me on the following activities: development of treatment plan with patient and/or surrogate as well as nursing, discussions with consultants, evaluation of patient's response to treatment, examination of patient, obtaining history from patient or surrogate, ordering and performing treatments and interventions, ordering and review of laboratory studies, ordering and review of radiographic studies, pulse oximetry and re-evaluation of patient's condition.  Tessie Fass MSN, AGACNP-BC [facility] Pulmonary/Critical Care Medicine Amion for pager  03/27/2022, 9:47 AM

## 2022-03-27 NOTE — Progress Notes (Signed)
   NAME:  Luis Beard, MRN:  [id number], DOB:  1980-11-22, LOS: 1 ADMISSION DATE:  03/26/2022 CONSULTATION DATE:  03/26/2022 REFERRING MD:  Jenne Pane - ENT CHIEF COMPLAINT:  Respiratory failure s/p procedure   History of Present Illness:  41 year old man who presented to Day Surgery 7/26 for planned bilateral turbinate reduction, septoplasty and adenoidectomy with ENT (Dr. Jenne Pane). PMHx significant for HTN, nasal turbinate hypertrophy, OSA refractory to medical therapy, history of tobacco use.   Patient presented to Day Surgery 7/26 for ENT procedure. Intraoperative course was notable for difficult intubation with Grade IV view. Postoperatively during anesthesia wake up, patient had a drop in O2 saturations and required additional ventilatory support. Racemic epinephrine and nebs administered. Decision to leave patient intubated.  PCCM consulted for ventilator management/ICU admission.  Pertinent Medical History:   Past Medical History:  Diagnosis Date   Asthma    Hypertension    Sleep apnea    Significant Hospital Events: Including procedures, antibiotic start and stop dates in addition to other pertinent events   7/26 - Underwent planned bilateral turbinate reduction, septoplasty and adenoidectomy with ENT Jenne Pane). Difficult intubation. Post-procedure hypoxia, left intubated. Transferred to [facility] via [facility]. PCCM consulted.  Interim History / Subjective:  No distress, waking up, O2 needs improving. Sluggish UOP.  Objective:  Blood pressure (!) 190/175, pulse 94, temperature 98.6 F (37 C), temperature source Axillary, resp. rate 16, height 5\' 9"  (1.753 m), weight (!) 150.6 kg, SpO2 94 %.    Vent Mode: PSV;CPAP FiO2 (%):  [50 %-100 %] 50 % Set Rate:  [16 bmp-18 bmp] (P) 16 bmp Vt Set:  [570 mL] (P) 570 mL PEEP:  [5 cmH20-16 cmH20] 5 cmH20 Pressure Support:  [10 cmH20] 10 cmH20 Plateau Pressure:  [28 cmH20-30 cmH20] 28 cmH20   Intake/Output Summary (Last 24 hours) at 03/27/2022  0858 Last data filed at 03/27/2022 0700 Gross per 24 hour  Intake 3226.62 ml  Output 300 ml  Net 2926.62 ml    Filed Weights   03/26/22 0830 03/26/22 1219 03/27/22 0600  Weight: (!) 142.5 kg (!) 142 kg (!) 150.6 kg   Physical Examination: No distress Oozing from packed nares Lungs diminished bases No edema Moves all 4 ext to command  Cr worse CBC stable  Resolved Hospital Problem List:    Assessment & Plan:  Postoperative respiratory insufficiency- some combination de-recruitment and volume overload.  Suspect former as diuresis had no effect while aggressive PEEP markedly increased sats. - Wean to extubate - Usual post extubation bundle  Postoperative acute kidney injury- wonder if obstructive, having issues with retention overnight.  Will start flomax and have low threshold for foley.  Post turbinate reduction- care per ENT, will add some TSS ppx while nares packed.  HTN- add back home meds PRN  32 min cc time 03/29/22 MD PCCM

## 2022-03-28 DIAGNOSIS — N179 Acute kidney failure, unspecified: Secondary | ICD-10-CM

## 2022-03-28 DIAGNOSIS — I10 Essential (primary) hypertension: Secondary | ICD-10-CM | POA: Diagnosis not present

## 2022-03-28 DIAGNOSIS — J9601 Acute respiratory failure with hypoxia: Secondary | ICD-10-CM | POA: Diagnosis not present

## 2022-03-28 DIAGNOSIS — G4733 Obstructive sleep apnea (adult) (pediatric): Secondary | ICD-10-CM | POA: Diagnosis not present

## 2022-03-28 LAB — CBC
HCT: 51.2 % (ref 39.0–52.0)
Hemoglobin: 14.4 g/dL (ref 13.0–17.0)
MCH: 21.4 pg — ABNORMAL LOW (ref 26.0–34.0)
MCHC: 28.1 g/dL — ABNORMAL LOW (ref 30.0–36.0)
MCV: 76.2 fL — ABNORMAL LOW (ref 80.0–100.0)
Platelets: 252 10*3/uL (ref 150–400)
RBC: 6.72 MIL/uL — ABNORMAL HIGH (ref 4.22–5.81)
RDW: 18.6 % — ABNORMAL HIGH (ref 11.5–15.5)
WBC: 12.4 10*3/uL — ABNORMAL HIGH (ref 4.0–10.5)
nRBC: 0.2 % (ref 0.0–0.2)

## 2022-03-28 LAB — BASIC METABOLIC PANEL
Anion gap: 7 (ref 5–15)
BUN: 24 mg/dL — ABNORMAL HIGH (ref 6–20)
CO2: 35 mmol/L — ABNORMAL HIGH (ref 22–32)
Calcium: 8.8 mg/dL — ABNORMAL LOW (ref 8.9–10.3)
Chloride: 97 mmol/L — ABNORMAL LOW (ref 98–111)
Creatinine, Ser: 1.26 mg/dL — ABNORMAL HIGH (ref 0.61–1.24)
GFR, Estimated: >60 mL/min (ref >60)
Glucose, Bld: 116 mg/dL — ABNORMAL HIGH (ref 70–99)
Potassium: 3.9 mmol/L (ref 3.5–5.1)
Sodium: 139 mmol/L (ref 135–145)

## 2022-03-28 LAB — GLUCOSE, CAPILLARY
Glucose-Capillary: 179 mg/dL — ABNORMAL HIGH (ref 70–99)
Glucose-Capillary: 117 mg/dL — ABNORMAL HIGH (ref 70–99)
Glucose-Capillary: 115 mg/dL — ABNORMAL HIGH (ref 70–99)
Glucose-Capillary: 89 mg/dL (ref 70–99)
Glucose-Capillary: 162 mg/dL — ABNORMAL HIGH (ref 70–99)

## 2022-03-28 MED ORDER — HYDROXYZINE HCL 25 MG PO TABS
25.0000 mg | ORAL_TABLET | Freq: Once | ORAL | Status: AC
Start: 2022-03-29 — End: 2022-03-28
  Administered 2022-03-28: 25 mg via ORAL
  Filled 2022-03-28: qty 1

## 2022-03-28 MED ORDER — AMLODIPINE BESYLATE 5 MG PO TABS
5.0000 mg | ORAL_TABLET | Freq: Every day | ORAL | Status: DC
  Administered 2022-03-28 – 2022-03-29 (×2): 5 mg via ORAL
  Filled 2022-03-28 (×2): qty 1

## 2022-03-28 NOTE — Evaluation (Signed)
Physical Therapy Evaluation/ Discharge Patient Details Name: Luis Beard MRN: [id number] DOB: Jun 29, 1981 Today's Date: 03/28/2022  History of Present Illness  41 yo male admitted 7/26 after hypoxia post planned procedure at Day surgery center for bil turbinate reduction, septoplasty and adenoidectomy with ENT (Dr. Jenne Pane) with pt requiring continued vent support. Extubated 7/27. PMHx:HTN, nasal turbinate hypertrophy, OSA, tobacco use.  Clinical Impression  Pt pleasant and up in chair on 80% face tent on arrival. Attempted [place] use in sitting on 3L but desaturation to 84% and transitioned to face tent at 35% on 6L via venturi adapter to tank for mobility with pt able to maintain 89-94% during gait. Pt with limited tolerance for [place] given nasal surgery and educated for continued gait and weaning of O2 for home. Pt able to perform all basic transfers and gait without further assist and no additional acute therapy needs with pt aware and agreeable, will sign off.        Recommendations for follow up therapy are one component of a multi-disciplinary discharge planning process, led by the attending physician.  Recommendations may be updated based on patient status, additional functional criteria and insurance authorization.  Follow Up Recommendations No PT follow up      Assistance Recommended at Discharge PRN  Patient can return home with the following       Equipment Recommendations None recommended by PT  Recommendations for Other Services       Functional Status Assessment Patient has not had a recent decline in their functional status     Precautions / Restrictions Precautions Precautions: Other (comment) Precaution Comments: watch sats      Mobility  Bed Mobility               General bed mobility comments: in chair on arrival and end of session    Transfers Overall transfer level: Independent                      Ambulation/Gait Ambulation/Gait  assistance: Independent Gait Distance (Feet): 800 Feet Assistive device: None Gait Pattern/deviations: WFL(Within Functional Limits)   Gait velocity interpretation: >4.37 ft/sec, indicative of normal walking speed   General Gait Details: pt maintained 90-95% on 35% face tent at 6L during gait without AD and with good speed  Stairs            Wheelchair Mobility    Modified Rankin (Stroke Patients Only)       Balance Overall balance assessment: No apparent balance deficits (not formally assessed)                                           Pertinent Vitals/Pain Pain Assessment Pain Assessment: No/denies pain    Home Living Family/patient expects to be discharged to:: Private residence Living Arrangements: Spouse/significant other Available Help at Discharge: Family Type of Home: House Home Access: Level entry       Home Layout: One level Home Equipment: None 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[employer], forensic Prior Level of Function : Independent/Modified Independent                     Higher education careers adviser        Extremity/Trunk Assessment   Upper Extremity Assessment Upper Extremity Assessment: Overall WFL for tasks assessed    Lower Extremity Assessment Lower Extremity Assessment: Overall WFL for  tasks assessed    Cervical / Trunk Assessment Cervical / Trunk Assessment: Normal  Communication   Communication: No difficulties  Cognition Arousal/Alertness: Awake/alert Behavior During Therapy: WFL for tasks assessed/performed Overall Cognitive Status: Within Functional Limits for tasks assessed                                          General Comments      Exercises     Assessment/Plan    PT Assessment Patient does not need any further PT services  PT Problem List         PT Treatment Interventions      PT Goals (Current goals can be found in the Care Plan section)  Acute Rehab PT  Goals PT Goal Formulation: All assessment and education complete, DC therapy    Frequency       Co-evaluation               AM-PAC PT "6 Clicks" Mobility  Outcome Measure Help needed turning from your back to your side while in a flat bed without using bedrails?: None Help needed moving from lying on your back to sitting on the side of a flat bed without using bedrails?: None Help needed moving to and from a bed to a chair (including a wheelchair)?: None Help needed standing up from a chair using your arms (e.g., wheelchair or bedside chair)?: None Help needed to walk in hospital room?: None Help needed climbing 3-5 steps with a railing? : None 6 Click Score: 24    End of Session Equipment Utilized During Treatment: Oxygen Activity Tolerance: Patient tolerated treatment well Patient left: in chair;with call bell/phone within reach Nurse Communication: Mobility status PT Visit Diagnosis: Other abnormalities of gait and mobility (R26.89)    Time: 8309-4076 PT Time Calculation (min) (ACUTE ONLY): 20 min   Charges:   PT Evaluation $PT Eval Low Complexity: 1 Low          Markiah Janeway P, PT Acute Rehabilitation Services Office: [phone]   Enedina Finner Yeslin Delio 03/28/2022, 12:02 PM

## 2022-03-28 NOTE — Progress Notes (Signed)
  Transition of Care [facility]) Screening Note   Patient Details  Name: Luis Beard Date of Birth: [birth year]   Transition of Care [facility]) CM/SW Contact:    Glennon Mac, RN Phone Number: 03/28/2022, 4:50 PM    Transition of Care Department [facility]) has reviewed patient and no TOC needs have been identified at this time. We will continue to monitor patient advancement through interdisciplinary progression rounds. If new patient transition needs arise, please place a TOC consult.  Quintella Baton, RN, BSN  Trauma/Neuro ICU Case Manager [phone]

## 2022-03-28 NOTE — Progress Notes (Signed)
Subjective: Sleepy but feeling good.  Objective: Vital signs in last 24 hours: Temp:  [97.7 F (36.5 C)-98.3 F (36.8 C)] 97.9 F (36.6 C) (07/28 0731) Pulse Rate:  [82-102] 84 (07/28 0600) Resp:  [0-27] 20 (07/28 0600) BP: (103-156)/(66-99) 105/71 (07/28 0600) SpO2:  [83 %-98 %] 94 % (07/28 0600) FiO2 (%):  [50 %-80 %] 80 % (07/28 0215) Weight:  [142.3 kg] 142.3 kg (07/28 0655) Wt Readings from Last 1 Encounters:  03/28/22 (!) 142.3 kg    Intake/Output from previous day: 07/27 0701 - 07/28 0700 In: 955.7 [P.O.:603; I.V.:152.6; IV Piggyback:200.1] Out: 3375 [Urine:3375] Intake/Output this shift: No intake/output data recorded.  General appearance: alert, cooperative, and no distress Nose: no active bleeding  Recent Labs    03/27/22 0652 03/28/22 0044  WBC 10.3 12.4*  HGB 14.6 14.4  HCT 51.7 51.2  PLT 295 252    Recent Labs    03/27/22 0652 03/28/22 0044  NA 136 139  K 4.5 3.9  CL 94* 97*  CO2 31 35*  GLUCOSE 135* 116*  BUN 22* 24*  CREATININE 2.36* 1.26*  CALCIUM 8.7* 8.8*    Medications: I have reviewed the patient's current medications.  Assessment/Plan: Obstructive sleep apnea s/p nasal surgery  Doing better, now extubated.  Kidney numbers improved.  Still desaturating during sleep, but not surprising.  He is set for transfer to regular room.  He refused CPAP last night but will encourage use tonight to look for stability for potential discharge tomorrow, if cleared medically.   LOS: 2 days   Christia Reading 03/28/2022, 8:06 AM

## 2022-03-28 NOTE — Progress Notes (Addendum)
PROGRESS NOTE        PATIENT DETAILS Name: Luis Beard Age: 41 y.o. Sex: male Date of Birth: [birth year] Admit Date: 03/26/2022 Admitting Physician Candee Furbish, MD [id number], Clyde Canterbury, MD  Brief Summary: Patient is a 41 y.o.  male who had a planned bilateral turbinate reduction/septoplasty/adenectomy on 7/26-apparently was a difficult intubation-postoperatively had significant drop in O2 saturations and required additional ventilatory support in the ICU.  Stabilized-extubated on 7/27-and transferred to [facility] service on 7/28.   Significant events: 7/26>> day surgery-bilateral turbinate reduction/septoplasty/adenoidectomy-difficult intubation-desaturation postop-left intubated.  Transferred to ICU 7/27>> extubated-AKI 7/28>> transfer to [facility].  Significant studies: 7/26>> CXR: Atelectasis versus consolidation LLL. 7/26>> CXR: Decreased atelectasis/consolidation LLL.  Significant microbiology data: None  Procedures: 7/26>> bilateral turbinate reduction, septoplasty, adenoidectomy  Consults: ENT, PCCM  Subjective: Lying comfortably in bed-denies any chest pain or shortness of breath.  Objective: Vitals: Blood pressure 105/71, pulse 90, temperature 97.9 F (36.6 C), temperature source Oral, resp. rate (!) 25, height 5\' 9"  (1.753 m), weight (!) 142.3 kg, SpO2 (!) 87 %.   Exam: Gen Exam:Alert awake-not in any distress HEENT:atraumatic, normocephalic Chest: B/L clear to auscultation anteriorly CVS:S1S2 regular Abdomen:soft non tender, non distended Extremities:no edema Neurology: Non focal Skin: no rash  Pertinent Labs/Radiology:    Latest Ref Rng & Units 03/28/2022   12:44 AM 03/27/2022    6:52 AM 03/26/2022    4:55 PM  CBC  WBC 4.0 - 10.5 K/uL 12.4  10.3  7.7   Hemoglobin 13.0 - 17.0 g/dL 14.4  14.6  14.3   Hematocrit 39.0 - 52.0 % 51.2  51.7  46.2   Platelets 150 - 400 K/uL 252  295  262     Lab Results  Component Value Date   NA 139  03/28/2022   K 3.9 03/28/2022   CL 97 (L) 03/28/2022   CO2 35 (H) 03/28/2022      Assessment/Plan: Acute hypoxic respiratory failure: Due to a combination of aspiration PNA, possible volume overload (noncardiogenic pulm edema).  Improved-titrate down FiO2 as tolerated.  Aspiration pneumonia: Continue Unasyn-when a bit more stable will transition to Augmentin.  AKI: Likely hemodynamically mediated-in the setting of diuretic use/hypoxia/aspiration PNA.  Follow electrolytes periodically.  HTN: BP stable-resume low-dose amlodipine-hold off on resuming valsartan/HCTZ for now.  OSA: Refused CPAP last night-Per ENT-okay to try CPAP today and see how he does.  Bilateral turbinate reduction/septoplasty/adenoidectomy: Per ENT.  Morbid Obesity: Estimated body mass index is 46.33 kg/m as calculated from the following:   Height as of this encounter: 5\' 9"  (1.753 m).   Weight as of this encounter: 142.3 kg.   Code status:   Code Status: Full Code   DVT Prophylaxis: SCDs Start: 03/26/22 1310   Family Communication: None at bedside   Disposition Plan: Status is: Inpatient Remains inpatient appropriate because: Resolving hypoxia-need to titrate down FiO2   Planned Discharge Destination:Home in the next 1-2 days.   Diet: Diet Order             Diet regular Room service appropriate? Yes; Fluid consistency: Thin  Diet effective now                     Antimicrobial agents: Anti-infectives (From admission, onward)    Start     Dose/Rate Route Frequency Ordered Stop   03/26/22 1500  Ampicillin-Sulbactam (UNASYN)  3 g in sodium chloride 0.9 % 100 mL IVPB        3 g 200 mL/hr over 30 Minutes Intravenous Every 6 hours 03/26/22 1409     03/26/22 0830  ceFAZolin (ANCEF) IVPB 2g/100 mL premix        2 g 200 mL/hr over 30 Minutes Intravenous On call to O.R. 03/26/22 0824 03/26/22 1042        MEDICATIONS: Scheduled Meds:  Chlorhexidine Gluconate Cloth  6 each Topical Daily    docusate sodium  100 mg Oral BID   insulin aspart  0-15 Units Subcutaneous Q4H   pantoprazole  40 mg Oral Daily   polyethylene glycol  17 g Oral Daily   Continuous Infusions:  sodium chloride Stopped (03/27/22 1411)   sodium chloride Stopped (03/27/22 1550)   ampicillin-sulbactam (UNASYN) IV 3 g (03/28/22 0826)   PRN Meds:.sodium chloride, acetaminophen-codeine, albuterol, ondansetron **OR** ondansetron (ZOFRAN) IV, mouth rinse   I have personally reviewed following labs and imaging studies  LABORATORY DATA: CBC: Recent Labs  Lab 03/26/22 1605 03/26/22 1655 03/27/22 0652 03/28/22 0044  WBC  --  7.7 10.3 12.4*  HGB 18.0* 14.3 14.6 14.4  HCT 53.0* 46.2 51.7 51.2  MCV  --  75.2* 75.8* 76.2*  PLT  --  262 295 252    Basic Metabolic Panel: Recent Labs  Lab 03/24/22 1600 03/26/22 1444 03/26/22 1605 03/26/22 2107 03/27/22 0652 03/28/22 0044  NA 137 135 134* 136 136 139  K 4.0 4.7 4.2 4.5 4.5 3.9  CL 93* 94*  --  94* 94* 97*  CO2 33* 28  --  30 31 35*  GLUCOSE 123* 133*  --  142* 135* 116*  BUN 6 11  --  16 22* 24*  CREATININE 1.11 1.60*  --  1.97* 2.36* 1.26*  CALCIUM 8.7* 8.6*  --  8.6* 8.7* 8.8*  MG  --  1.6*  --   --  2.7*  --   PHOS  --  2.0*  --   --   --   --     GFR: Estimated Creatinine Clearance: 111.6 mL/min (A) (by C-G formula based on SCr of 1.26 mg/dL (H)).  Liver Function Tests: Recent Labs  Lab 03/26/22 1444  AST 16  ALT 15  ALKPHOS 77  BILITOT 0.4  PROT 6.7  ALBUMIN 3.3*   No results for input(s): "LIPASE", "AMYLASE" in the last 168 hours. No results for input(s): "AMMONIA" in the last 168 hours.  Coagulation Profile: No results for input(s): "INR", "PROTIME" in the last 168 hours.  Cardiac Enzymes: No results for input(s): "CKTOTAL", "CKMB", "CKMBINDEX", "TROPONINI" in the last 168 hours.  BNP (last 3 results) No results for input(s): "PROBNP" in the last 8760 hours.  Lipid Profile: Recent Labs    03/27/22 0652  TRIG 187*     Thyroid Function Tests: No results for input(s): "TSH", "T4TOTAL", "FREET4", "T3FREE", "THYROIDAB" in the last 72 hours.  Anemia Panel: No results for input(s): "VITAMINB12", "FOLATE", "FERRITIN", "TIBC", "IRON", "RETICCTPCT" in the last 72 hours.  Urine analysis:    Component Value Date/Time   COLORURINE YELLOW 02/06/2021 1138   APPEARANCEUR CLEAR 02/06/2021 1138   LABSPEC 1.021 02/06/2021 1138   PHURINE 6.0 02/06/2021 1138   GLUCOSEU NEGATIVE 02/06/2021 1138   HGBUR NEGATIVE 02/06/2021 1138   BILIRUBINUR NEGATIVE 02/06/2021 1138   KETONESUR NEGATIVE 02/06/2021 1138   PROTEINUR 100 (A) 02/06/2021 1138   UROBILINOGEN 1.0 09/12/2014 1543   NITRITE NEGATIVE 02/06/2021 1138  LEUKOCYTESUR NEGATIVE 02/06/2021 1138    Sepsis Labs: Lactic Acid, Venous No results found for: "LATICACIDVEN"  MICROBIOLOGY: No results found for this or any previous visit (from the past 240 hour(s)).  RADIOLOGY STUDIES/RESULTS: DG Chest Port 1 View  Result Date: 03/27/2022 CLINICAL DATA:  Endotracheal tube, acute hypoxic respiratory failure EXAM: PORTABLE CHEST 1 VIEW COMPARISON:  Radiographs 03/26/2022 FINDINGS: Unchanged position of the endotracheal and enteric tubes. Cardiomegaly and pulmonary vascular congestion. Mild interstitial edema. Decreased atelectasis/consolidation in the left lower lobe. No pneumothorax. IMPRESSION: Decreased atelectasis/consolidation in the left lower lobe. Similar cardiomegaly and pulmonary vascular congestion. Electronically Signed   By: Minerva Fester M.D.   On: 03/27/2022 08:28   DG Chest Port 1 View  Result Date: 03/26/2022 CLINICAL DATA:  Hypoxia EXAM: PORTABLE CHEST 1 VIEW COMPARISON:  Portable exam 1344 hours compared to 02/06/2021 FINDINGS: Tip of endotracheal tube projects 6.7 cm above carina. Nasogastric tube extends into stomach. Enlargement of cardiac silhouette with slight vascular congestion. Atelectasis versus consolidation LEFT lower lobe increased since  previous exam. Remaining lungs clear. No pneumothorax or acute osseous findings. IMPRESSION: Atelectasis versus consolidation LEFT lower lobe. Enlargement of cardiac silhouette with slight pulmonary vascular congestion. Electronically Signed   By: Ulyses Southward M.D.   On: 03/26/2022 13:59     LOS: 2 days   Jeoffrey Massed, MD  [facility]    To contact the attending provider between 7A-7P or the covering provider during after hours 7P-7A, please log into the web site [email or web address] and access using universal [place] password for that web site. If you do not have the password, please call the hospital operator.  03/28/2022, 9:27 AM

## 2022-03-29 DIAGNOSIS — J9601 Acute respiratory failure with hypoxia: Secondary | ICD-10-CM | POA: Diagnosis not present

## 2022-03-29 DIAGNOSIS — I10 Essential (primary) hypertension: Secondary | ICD-10-CM | POA: Diagnosis not present

## 2022-03-29 DIAGNOSIS — N179 Acute kidney failure, unspecified: Secondary | ICD-10-CM | POA: Diagnosis not present

## 2022-03-29 DIAGNOSIS — G4733 Obstructive sleep apnea (adult) (pediatric): Secondary | ICD-10-CM | POA: Diagnosis not present

## 2022-03-29 LAB — BASIC METABOLIC PANEL
Anion gap: 7 (ref 5–15)
BUN: 16 mg/dL (ref 6–20)
CO2: 37 mmol/L — ABNORMAL HIGH (ref 22–32)
Calcium: 8.8 mg/dL — ABNORMAL LOW (ref 8.9–10.3)
Chloride: 95 mmol/L — ABNORMAL LOW (ref 98–111)
Creatinine, Ser: 1.05 mg/dL (ref 0.61–1.24)
GFR, Estimated: >60 mL/min (ref >60)
Glucose, Bld: 113 mg/dL — ABNORMAL HIGH (ref 70–99)
Potassium: 4.1 mmol/L (ref 3.5–5.1)
Sodium: 139 mmol/L (ref 135–145)

## 2022-03-29 LAB — GLUCOSE, CAPILLARY
Glucose-Capillary: 106 mg/dL — ABNORMAL HIGH (ref 70–99)
Glucose-Capillary: 114 mg/dL — ABNORMAL HIGH (ref 70–99)
Glucose-Capillary: 115 mg/dL — ABNORMAL HIGH (ref 70–99)
Glucose-Capillary: 141 mg/dL — ABNORMAL HIGH (ref 70–99)
Glucose-Capillary: 236 mg/dL — ABNORMAL HIGH (ref 70–99)
Glucose-Capillary: 85 mg/dL (ref 70–99)
Glucose-Capillary: 89 mg/dL (ref 70–99)

## 2022-03-29 LAB — CBC
HCT: 50.2 % (ref 39.0–52.0)
Hemoglobin: 14 g/dL (ref 13.0–17.0)
MCH: 21.8 pg — ABNORMAL LOW (ref 26.0–34.0)
MCHC: 27.9 g/dL — ABNORMAL LOW (ref 30.0–36.0)
MCV: 78.2 fL — ABNORMAL LOW (ref 80.0–100.0)
Platelets: 242 10*3/uL (ref 150–400)
RBC: 6.42 MIL/uL — ABNORMAL HIGH (ref 4.22–5.81)
RDW: 18.6 % — ABNORMAL HIGH (ref 11.5–15.5)
WBC: 10.1 10*3/uL (ref 4.0–10.5)
nRBC: 0 % (ref 0.0–0.2)

## 2022-03-29 MED ORDER — HYDRALAZINE HCL 20 MG/ML IJ SOLN
10.0000 mg | Freq: Four times a day (QID) | INTRAMUSCULAR | Status: DC | PRN
  Administered 2022-03-29 – 2022-03-30 (×2): 10 mg via INTRAVENOUS
  Filled 2022-03-29 (×2): qty 1

## 2022-03-29 MED ORDER — HYDROCHLOROTHIAZIDE 25 MG PO TABS
25.0000 mg | ORAL_TABLET | Freq: Every day | ORAL | Status: DC
  Administered 2022-03-29 – 2022-03-30 (×2): 25 mg via ORAL
  Filled 2022-03-29 (×2): qty 1

## 2022-03-29 MED ORDER — MENTHOL 3 MG MT LOZG
1.0000 | LOZENGE | OROMUCOSAL | Status: DC | PRN
Start: 2022-03-29 — End: 2022-03-30
  Administered 2022-03-29: 3 mg via ORAL
  Filled 2022-03-29: qty 9

## 2022-03-29 MED ORDER — HYDROXYZINE HCL 25 MG PO TABS
50.0000 mg | ORAL_TABLET | Freq: Once | ORAL | Status: AC
  Administered 2022-03-30: 50 mg via ORAL
  Filled 2022-03-29: qty 2

## 2022-03-29 MED ORDER — ACETAMINOPHEN 325 MG PO TABS
650.0000 mg | ORAL_TABLET | Freq: Four times a day (QID) | ORAL | Status: DC | PRN
  Administered 2022-03-29: 650 mg via ORAL
  Filled 2022-03-29: qty 2

## 2022-03-29 NOTE — Plan of Care (Signed)
  Problem: Education: Goal: Knowledge of General Education information will improve Description: Including pain rating scale, medication(s)/side effects and non-pharmacologic comfort measures Outcome: Progressing   Problem: Clinical Measurements: Goal: Respiratory complications will improve Outcome: Progressing   Problem: Activity: Goal: Risk for activity intolerance will decrease Outcome: Progressing   

## 2022-03-29 NOTE — Progress Notes (Signed)
Pt was on RA and desaturating into the 40's. Pt placed on VM and continued same obstruction resp pattern but only dropped into the mid 70's and self resolved w/resumed respirations which appeared spontaneous but may have been due to awaking by alarm. Shoulder roll placed and obstruction periods appeared to decrease.

## 2022-03-29 NOTE — Progress Notes (Signed)
Attempted CPAP. Over an hour attempted w/several masks and ramp levels. Pt's nose remains congested and pt continued to obstruct w/resultant desats. Pt returned to [place] and tol better. Continued w/same pattern but more comfortable.

## 2022-03-29 NOTE — Plan of Care (Signed)
  Problem: Education: Goal: Knowledge of General Education information will improve Description: Including pain rating scale, medication(s)/side effects and non-pharmacologic comfort measures Outcome: Progressing   Problem: Health Behavior/Discharge Planning: Goal: Ability to manage health-related needs will improve Outcome: Progressing   Problem: Clinical Measurements: Goal: Ability to maintain clinical measurements within normal limits will improve Outcome: Progressing Goal: Will remain free from infection Outcome: Progressing Goal: Diagnostic test results will improve Outcome: Progressing Goal: Respiratory complications will improve Outcome: Progressing Goal: Cardiovascular complication will be avoided Outcome: Progressing   Problem: Activity: Goal: Risk for activity intolerance will decrease Outcome: Progressing   Problem: Nutrition: Goal: Adequate nutrition will be maintained Outcome: Progressing   Problem: Coping: Goal: Level of anxiety will decrease Outcome: Progressing   Problem: Elimination: Goal: Will not experience complications related to bowel motility Outcome: Progressing Goal: Will not experience complications related to urinary retention Outcome: Progressing   Problem: Pain Managment: Goal: General experience of comfort will improve Outcome: Progressing   Problem: Safety: Goal: Ability to remain free from injury will improve Outcome: Progressing   Problem: Skin Integrity: Goal: Risk for impaired skin integrity will decrease Outcome: Progressing   Problem: Activity: Goal: Ability to tolerate increased activity will improve Outcome: Progressing   Problem: Respiratory: Goal: Ability to maintain a clear airway and adequate ventilation will improve Outcome: Progressing   Problem: Role Relationship: Goal: Method of communication will improve Outcome: Progressing   Problem: Safety: Goal: Non-violent Restraint(s) Outcome: Progressing    Problem: Education: Goal: Ability to describe self-care measures that may prevent or decrease complications (Diabetes Survival Skills Education) will improve Outcome: Progressing Goal: Individualized Educational Video(s) Outcome: Progressing   Problem: Coping: Goal: Ability to adjust to condition or change in health will improve Outcome: Progressing   Problem: Fluid Volume: Goal: Ability to maintain a balanced intake and output will improve Outcome: Progressing   Problem: Health Behavior/Discharge Planning: Goal: Ability to identify and utilize available resources and services will improve Outcome: Progressing Goal: Ability to manage health-related needs will improve Outcome: Progressing   Problem: Metabolic: Goal: Ability to maintain appropriate glucose levels will improve Outcome: Progressing   Problem: Nutritional: Goal: Maintenance of adequate nutrition will improve Outcome: Progressing Goal: Progress toward achieving an optimal weight will improve Outcome: Progressing   Problem: Skin Integrity: Goal: Risk for impaired skin integrity will decrease Outcome: Progressing   Problem: Tissue Perfusion: Goal: Adequacy of tissue perfusion will improve Outcome: Progressing   

## 2022-03-29 NOTE — Progress Notes (Signed)
PROGRESS NOTE        PATIENT DETAILS Name: Luis Beard Age: 41 y.o. Sex: male Date of Birth: 1981/03/13 Admit Date: 03/26/2022 Admitting Physician Lorin Glass, MD [id number], Lauris Poag, MD  Brief Summary: Patient is a 41 y.o.  male who had a planned bilateral turbinate reduction/septoplasty/adenectomy on 7/26-apparently was a difficult intubation-postoperatively had significant drop in O2 saturations and required additional ventilatory support in the ICU.  Stabilized-extubated on 7/27-and transferred to [facility] service on 7/28.   Significant events: 7/26>> day surgery-bilateral turbinate reduction/septoplasty/adenoidectomy-difficult intubation-desaturation postop-left intubated.  Transferred to ICU 7/27>> extubated-AKI 7/28>> transfer to [facility].  Significant studies: 7/26>> CXR: Atelectasis versus consolidation LLL. 7/26>> CXR: Decreased atelectasis/consolidation LLL.  Significant microbiology data: None  Procedures: 7/26>> bilateral turbinate reduction, septoplasty, adenoidectomy  Consults: ENT, PCCM  Subjective: Did not tolerate CPAP last night.  He was placed on a Ventimask-that he removed earlier this morning-when I walked into his room-patient was sleeping with his O2 saturations were in the 50s-60s range.  Placed on Ventimask-O2 saturations back up in the 90s.  Subsequently when he is awake and alert-when Ventimask removed-he is stable on room air.  But the moment he goes to sleep-he starts desaturating very easily.  Objective: Vitals: Blood pressure (!) 187/120, pulse 93, temperature 97.8 F (36.6 C), temperature source Oral, resp. rate 19, height 5\' 9"  (1.753 m), weight (!) 142.3 kg, SpO2 95 %.   Exam: Gen Exam:Alert awake-not in any distress HEENT:atraumatic, normocephalic Chest: B/L clear to auscultation anteriorly CVS:S1S2 regular Abdomen:soft non tender, non distended Extremities:no edema Neurology: Non focal Skin: no rash    Pertinent Labs/Radiology:    Latest Ref Rng & Units 03/29/2022    1:55 AM 03/28/2022   12:44 AM 03/27/2022    6:52 AM  CBC  WBC 4.0 - 10.5 K/uL 10.1  12.4  10.3   Hemoglobin 13.0 - 17.0 g/dL 03/29/2022  91.6  38.4   Hematocrit 39.0 - 52.0 % 50.2  51.2  51.7   Platelets 150 - 400 K/uL 242  252  295     Lab Results  Component Value Date   NA 139 03/29/2022   K 4.1 03/29/2022   CL 95 (L) 03/29/2022   CO2 37 (H) 03/29/2022      Assessment/Plan: Acute hypoxic respiratory failure: Due to a combination of aspiration PNA, possible volume overload (noncardiogenic pulm edema).  Left intubated postoperatively and extubated in the ICU.  When awake/alert he is able to tolerate being on room air-however significant desaturation while sleeping (unable to tolerate BiPAP with last night) see below.  Aspiration pneumonia: Continue Unasyn-transition to Augmentin on discharge.  AKI: Likely hemodynamically mediated-in the setting of diuretic use/hypoxia/aspiration PNA.  AKI has resolved.    HTN: BP creeping up-continue amlodipine-resume HCTZ today-if blood pressure still elevated-resume losartan tomorrow.  OSA: Very difficult situation-intolerant to CPAP last night-O2 saturations while sleeping at times will drop to the 60s.  Currently seems to be tolerating Ventimask (which he at times removes).  Suspect needs another day of inpatient monitoring to see if he can tolerate CPAP overnight-unfortunately he has severe nocturnal hypoxemia with O2 saturations going down to the 60s.  Bilateral turbinate reduction/septoplasty/adenoidectomy: Per ENT.  Morbid Obesity: Estimated body mass index is 46.33 kg/m as calculated from the following:   Height as of this encounter: 5\' 9"  (1.753 m).   Weight as of  this encounter: 142.3 kg.   Code status:   Code Status: Full Code   DVT Prophylaxis: SCDs Start: 03/26/22 1310   Family Communication: Spouse at bedside   Disposition Plan: Status is: Inpatient Remains  inpatient appropriate because: Severe nocturnal hypoxemia-unable to tolerate BiPAP last night-retry tonight.   Planned Discharge Destination:Home able to tolerate BiPAP.   Diet: Diet Order             Diet regular Room service appropriate? Yes; Fluid consistency: Thin  Diet effective now                     Antimicrobial agents: Anti-infectives (From admission, onward)    Start     Dose/Rate Route Frequency Ordered Stop   03/26/22 1500  Ampicillin-Sulbactam (UNASYN) 3 g in sodium chloride 0.9 % 100 mL IVPB        3 g 200 mL/hr over 30 Minutes Intravenous Every 6 hours 03/26/22 1409     03/26/22 0830  ceFAZolin (ANCEF) IVPB 2g/100 mL premix        2 g 200 mL/hr over 30 Minutes Intravenous On call to O.R. 03/26/22 0824 03/26/22 1042        MEDICATIONS: Scheduled Meds:  amLODipine  5 mg Oral Daily   Chlorhexidine Gluconate Cloth  6 each Topical Daily   docusate sodium  100 mg Oral BID   hydrochlorothiazide  25 mg Oral Daily   insulin aspart  0-15 Units Subcutaneous Q4H   pantoprazole  40 mg Oral Daily   polyethylene glycol  17 g Oral Daily   Continuous Infusions:  sodium chloride Stopped (03/27/22 1411)   sodium chloride Stopped (03/27/22 1550)   ampicillin-sulbactam (UNASYN) IV 3 g (03/29/22 0906)   PRN Meds:.sodium chloride, acetaminophen, acetaminophen-codeine, albuterol, ondansetron **OR** ondansetron (ZOFRAN) IV, mouth rinse   I have personally reviewed following labs and imaging studies  LABORATORY DATA: CBC: Recent Labs  Lab 03/26/22 1605 03/26/22 1655 03/27/22 0652 03/28/22 0044 03/29/22 0155  WBC  --  7.7 10.3 12.4* 10.1  HGB 18.0* 14.3 14.6 14.4 14.0  HCT 53.0* 46.2 51.7 51.2 50.2  MCV  --  75.2* 75.8* 76.2* 78.2*  PLT  --  262 295 252 242     Basic Metabolic Panel: Recent Labs  Lab 03/26/22 1444 03/26/22 1605 03/26/22 2107 03/27/22 0652 03/28/22 0044 03/29/22 0155  NA 135 134* 136 136 139 139  K 4.7 4.2 4.5 4.5 3.9 4.1  CL 94*   --  94* 94* 97* 95*  CO2 28  --  30 31 35* 37*  GLUCOSE 133*  --  142* 135* 116* 113*  BUN 11  --  16 22* 24* 16  CREATININE 1.60*  --  1.97* 2.36* 1.26* 1.05  CALCIUM 8.6*  --  8.6* 8.7* 8.8* 8.8*  MG 1.6*  --   --  2.7*  --   --   PHOS 2.0*  --   --   --   --   --      GFR: Estimated Creatinine Clearance: 134 mL/min (by C-G formula based on SCr of 1.05 mg/dL).  Liver Function Tests: Recent Labs  Lab 03/26/22 1444  AST 16  ALT 15  ALKPHOS 77  BILITOT 0.4  PROT 6.7  ALBUMIN 3.3*    No results for input(s): "LIPASE", "AMYLASE" in the last 168 hours. No results for input(s): "AMMONIA" in the last 168 hours.  Coagulation Profile: No results for input(s): "INR", "PROTIME" in the last 168  hours.  Cardiac Enzymes: No results for input(s): "CKTOTAL", "CKMB", "CKMBINDEX", "TROPONINI" in the last 168 hours.  BNP (last 3 results) No results for input(s): "PROBNP" in the last 8760 hours.  Lipid Profile: Recent Labs    03/27/22 0652  TRIG 187*     Thyroid Function Tests: No results for input(s): "TSH", "T4TOTAL", "FREET4", "T3FREE", "THYROIDAB" in the last 72 hours.  Anemia Panel: No results for input(s): "VITAMINB12", "FOLATE", "FERRITIN", "TIBC", "IRON", "RETICCTPCT" in the last 72 hours.  Urine analysis:    Component Value Date/Time   COLORURINE YELLOW 02/06/2021 1138   APPEARANCEUR CLEAR 02/06/2021 1138   LABSPEC 1.021 02/06/2021 1138   PHURINE 6.0 02/06/2021 1138   GLUCOSEU NEGATIVE 02/06/2021 1138   HGBUR NEGATIVE 02/06/2021 1138   BILIRUBINUR NEGATIVE 02/06/2021 1138   KETONESUR NEGATIVE 02/06/2021 1138   PROTEINUR 100 (A) 02/06/2021 1138   UROBILINOGEN 1.0 09/12/2014 1543   NITRITE NEGATIVE 02/06/2021 1138   LEUKOCYTESUR NEGATIVE 02/06/2021 1138    Sepsis Labs: Lactic Acid, Venous No results found for: "LATICACIDVEN"  MICROBIOLOGY: No results found for this or any previous visit (from the past 240 hour(s)).  RADIOLOGY STUDIES/RESULTS: No  results found.   LOS: 3 days   Jeoffrey Massed, MD  [facility]    To contact the attending provider between 7A-7P or the covering provider during after hours 7P-7A, please log into the web site [email or web address] and access using universal [place] password for that web site. If you do not have the password, please call the hospital operator.  03/29/2022, 11:26 AM

## 2022-03-30 DIAGNOSIS — I10 Essential (primary) hypertension: Secondary | ICD-10-CM | POA: Diagnosis not present

## 2022-03-30 DIAGNOSIS — Z01818 Encounter for other preprocedural examination: Secondary | ICD-10-CM

## 2022-03-30 DIAGNOSIS — J9601 Acute respiratory failure with hypoxia: Secondary | ICD-10-CM | POA: Diagnosis not present

## 2022-03-30 LAB — GLUCOSE, CAPILLARY
Glucose-Capillary: 121 mg/dL — ABNORMAL HIGH (ref 70–99)
Glucose-Capillary: 140 mg/dL — ABNORMAL HIGH (ref 70–99)

## 2022-03-30 MED ORDER — AMOXICILLIN-POT CLAVULANATE 875-125 MG PO TABS
1.0000 | ORAL_TABLET | Freq: Two times a day (BID) | ORAL | Status: DC
  Administered 2022-03-30: 1 via ORAL
  Filled 2022-03-30: qty 1

## 2022-03-30 MED ORDER — ACETAMINOPHEN-CODEINE 300-30 MG PO TABS
1.0000 | ORAL_TABLET | Freq: Four times a day (QID) | ORAL | 0 refills | Status: AC | PRN
Start: 2022-03-30 — End: 2022-04-06

## 2022-03-30 MED ORDER — HYDROXYZINE HCL 50 MG PO TABS: 50.0000 mg | ORAL_TABLET | Freq: Every day | ORAL | 0 refills | Status: DC

## 2022-03-30 MED ORDER — AMLODIPINE BESYLATE 10 MG PO TABS
10.0000 mg | ORAL_TABLET | Freq: Every day | ORAL | Status: DC
  Administered 2022-03-30: 10 mg via ORAL
  Filled 2022-03-30: qty 1

## 2022-03-30 MED ORDER — IRBESARTAN 300 MG PO TABS
300.0000 mg | ORAL_TABLET | Freq: Every day | ORAL | Status: DC
  Administered 2022-03-30: 300 mg via ORAL
  Filled 2022-03-30: qty 1

## 2022-03-30 MED ORDER — AMOXICILLIN-POT CLAVULANATE 875-125 MG PO TABS: 1.0000 | ORAL_TABLET | Freq: Two times a day (BID) | ORAL | 0 refills | Status: AC

## 2022-03-30 NOTE — Plan of Care (Signed)
  Problem: Education: Goal: Knowledge of General Education information will improve Description: Including pain rating scale, medication(s)/side effects and non-pharmacologic comfort measures Outcome: Progressing   Problem: Activity: Goal: Risk for activity intolerance will decrease Outcome: Progressing   Problem: Coping: Goal: Level of anxiety will decrease Outcome: Progressing   

## 2022-03-30 NOTE — Progress Notes (Signed)
Pt given medication for anxiety and attempted to place on CPAP via [employer] using [facility] full face mask. Pt tolerated but continued to experience frequent periods of apnea and would awaken agitated. Switched to BiPAP and apneic periods were reduced and pt remained asleep w/O2 sats 94-100% at 8L. Suggest trying just CPAP tonight as that is his home setting and dropping O2 to 6-4L. Pt may also be able to use his nasal mask but may need a few more days.

## 2022-03-30 NOTE — Discharge Summary (Signed)
Physician Discharge Summary   Patient: Luis Beard MRN: [id number] DOB: [birth year]  Admit date:     03/26/2022  Discharge date: 03/30/22  Discharge Physician: Thad Ranger   PCP: Georganna Skeans, MD   Recommendations at discharge:   Continue Augmentin 1 tab twice daily for 7 days    Discharge Diagnoses:  Acute respiratory failure with hypoxia Bilateral turbinate reduction/septoplasty/adenectomy Obstructive sleep apnea on CPAP Aspiration pneumonia AKI Essential hypertension Morbid obesity  Hospital Course: Patient is a 41 y.o.  male who had a planned bilateral turbinate reduction/septoplasty/adenectomy on 7/26-apparently was a difficult intubation-postoperatively had significant drop in O2 saturations and required additional ventilatory support in the ICU. Patient was stabilized and extubated on 7/27.  Transferred to the floor.   Assessment and Plan:  Acute hypoxic respiratory failure:  - Due to a combination of aspiration PNA, possible volume overload (noncardiogenic pulm edema).  -Patient was left intubated postoperatively and extubated in ICU on 7/27.  -Patient was noted to have significant desaturation while sleeping as he was not able to tolerate the CPAP with home nasal mask. -Patient was placed on Atarax to reduce anxiety and subsequently placed on full facemask on CPAP last night, which he tolerated better.  He was recommended to continue the full facemask until his follow-up with the ENT in 2 weeks. -While alert and awake, his O2 sats are 95% on room air. BNP 47.9   Aspiration pneumonia: -Patient was placed on IV Unasyn, transition to oral Augmentin on discharge.   AKI: Likely hemodynamically mediated-in the setting of diuretic use/hypoxia/aspiration PNA.  -Creatinine trended up to 2.36.  Acute kidney injury has resolved -Creatinine 1.05 at the time of discharge.   HTN:  -BP was noted to be elevated during hospitalization hence his oral antihypertensives  have now been resumed   OSA:  -Patient was able to tolerate CPAP with full facemask and Atarax .     Bilateral turbinate reduction/septoplasty/adenoidectomy: Per ENT.  Outpatient follow-up in 2 weeks   Morbid Obesity: Estimated body mass index is 46.33 kg/m as calculated from the following:   Height as of this encounter: 5\' 9"  (1.753 m).   Weight as of this encounter: 142.3 kg.         Pain control - Controlled Substance Reporting System database was reviewed. and patient was instructed, not to drive, operate heavy machinery, perform activities at heights, swimming or participation in water activities or provide baby-sitting services while on Pain, Sleep and Anxiety Medications; until their outpatient Physician has advised to do so again. Also recommended to not to take more than prescribed Pain, Sleep and Anxiety Medications.  Consultants: ENT, critical care Procedures performed: Intubation, extubation Bilateral turbinate reduction, septoplasty, adenoidectomy Disposition: Home Diet recommendation:  Discharge Diet Orders (From admission, onward)     Start     Ordered   03/30/22 0000  Diet - low sodium heart healthy        03/30/22 0852            DISCHARGE MEDICATION: Allergies as of 03/30/2022   No Known Allergies      Medication List     STOP taking these medications    chlorpheniramine-HYDROcodone 10-8 MG/5ML       TAKE these medications    acetaminophen-codeine 300-30 MG tablet Commonly known as: TYLENOL #3 Take 1 tablet by mouth every 6 (six) hours as needed for up to 7 days for moderate pain.   albuterol 108 (90 Base) MCG/ACT inhaler Commonly known as: VENTOLIN  HFA Inhale 1-2 puffs into the lungs every 6 (six) hours as needed for wheezing or shortness of breath.   amLODipine 10 MG tablet Commonly known as: NORVASC Take 1 tablet (10 mg total) by mouth daily.   amoxicillin-clavulanate 875-125 MG tablet Commonly known as:  AUGMENTIN Take 1 tablet by mouth 2 (two) times daily for 7 days.   fluticasone 50 MCG/ACT nasal spray Commonly known as: FLONASE Place 2 sprays into the nose daily as needed for allergies.   fluticasone-salmeterol 500-50 MCG/ACT Aepb Commonly known as: Advair Diskus Inhale 1 puff into the lungs in the morning and at bedtime.   hydrochlorothiazide 25 MG tablet Commonly known as: HYDRODIURIL Take 1 tablet (25 mg total) by mouth daily.   hydrOXYzine 50 MG tablet Commonly known as: ATARAX Take 1 tablet (50 mg total) by mouth at bedtime.   ibuprofen 200 MG tablet Commonly known as: ADVIL Take 400 mg by mouth every 6 (six) hours as needed for mild pain or headache.   valsartan 320 MG tablet Commonly known as: DIOVAN Take 1 tablet (320 mg total) by mouth daily.               Discharge Care Instructions  (From admission, onward)           Start     Ordered   03/30/22 0000  If the dressing is still on your incision site when you go home, remove it on the third day after your surgery date. Remove dressing if it begins to fall off, or if it is dirty or damaged before the third day.        03/30/22 8299            Follow-up Information     Christia Reading, MD. Schedule an appointment as soon as possible for a visit in 2 week(s).   Specialty: Otolaryngology Contact information: [address] [place] [address] [phone]         Georganna Skeans, MD Follow up on 05/06/2022.   Specialty: Family Medicine Contact information: [address] [place] [address] [phone]                Discharge Exam: Ceasar Mons Weights   03/26/22 1219 03/27/22 0600 03/28/22 0655  Weight: (!) 142 kg (!) 150.6 kg (!) 142.3 kg   S: Overnight was able to tolerate the CPAP with Atarax and fullface mask on.  Feeling better today.   Vitals:   03/30/22 0055 03/30/22 0359 03/30/22 0528 03/30/22 0809  BP:  (!) 167/120 (!) 162/108 (!) 145/106   Pulse: 82 89 88 (!) 104  Resp: 18 (!) 21 15 18   Temp:  97.7 F (36.5 C)  98 F (36.7 C)  TempSrc:  Axillary    SpO2: 98% (!) 89% 97% 95%  Weight:      Height:        Physical Exam General: Alert and oriented x 3, NAD Cardiovascular: S1 S2 clear, RRR. No pedal edema b/l Respiratory: CTAB, no wheezing, rales or rhonchi Gastrointestinal: Soft, nontender, nondistended, NBS Ext: no pedal edema bilaterally Neuro: moving all 4 extremities spontaneously Psych: Normal affect and demeanor, alert and oriented x3      Condition at discharge: fair  The results of significant diagnostics from this hospitalization (including imaging, microbiology, ancillary and laboratory) are listed below for reference.   Imaging Studies: DG Chest Port 1 View  Result Date: 03/27/2022 CLINICAL DATA:  Endotracheal tube, acute hypoxic respiratory failure EXAM:  PORTABLE CHEST 1 VIEW COMPARISON:  Radiographs 03/26/2022 FINDINGS: Unchanged position of the endotracheal and enteric tubes. Cardiomegaly and pulmonary vascular congestion. Mild interstitial edema. Decreased atelectasis/consolidation in the left lower lobe. No pneumothorax. IMPRESSION: Decreased atelectasis/consolidation in the left lower lobe. Similar cardiomegaly and pulmonary vascular congestion. Electronically Signed   By: Minerva Fester M.D.   On: 03/27/2022 08:28   DG Chest Port 1 View  Result Date: 03/26/2022 CLINICAL DATA:  Hypoxia EXAM: PORTABLE CHEST 1 VIEW COMPARISON:  Portable exam 1344 hours compared to 02/06/2021 FINDINGS: Tip of endotracheal tube projects 6.7 cm above carina. Nasogastric tube extends into stomach. Enlargement of cardiac silhouette with slight vascular congestion. Atelectasis versus consolidation LEFT lower lobe increased since previous exam. Remaining lungs clear. No pneumothorax or acute osseous findings. IMPRESSION: Atelectasis versus consolidation LEFT lower lobe. Enlargement of cardiac silhouette with slight pulmonary  vascular congestion. Electronically Signed   By: Ulyses Southward M.D.   On: 03/26/2022 13:59    Microbiology: Results for orders placed or performed during the hospital encounter of 02/06/21  Novel Coronavirus, NAA ([facility])     Status: None   Collection Time: 02/06/21  9:23 AM   Specimen: Nasopharyngeal Swab; Nasopharyngeal(NP) swabs in vial transport medium   Nasopharynge  Result Value Ref Range Status   SARS-CoV-2, NAA Not Detected Not Detected Final    Comment: This nucleic acid amplification test was developed and its performance characteristics determined by [employer]. Nucleic acid amplification tests include RT-PCR and TMA. This test has not been FDA cleared or approved. This test has been authorized by FDA under an Emergency Use Authorization (EUA). This test is only authorized for the duration of time the declaration that circumstances exist justifying the authorization of the emergency use of in vitro diagnostic tests for detection of SARS-CoV-2 virus and/or diagnosis of COVID-19 infection under section 564(b)(1) of the Act, 21 U.S.C. 244WNU-2(V) (1), unless the authorization is terminated or revoked sooner. When diagnostic testing is negative, the possibility of a false negative result should be considered in the context of a patient's recent exposures and the presence of clinical signs and symptoms consistent with COVID-19. An individual without symptoms of COVID-19 and who is not shedding SARS-CoV-2 virus wo uld expect to have a negative (not detected) result in this assay.   SARS-COV-2, NAA 2 DAY TAT     Status: None   Collection Time: 02/06/21  9:23 AM   Nasopharynge  Result Value Ref Range Status   SARS-CoV-2, NAA 2 DAY TAT Performed  Final    Labs: CBC: Recent Labs  Lab 03/26/22 1605 03/26/22 1655 03/27/22 0652 03/28/22 0044 03/29/22 0155  WBC  --  7.7 10.3 12.4* 10.1  HGB 18.0* 14.3 14.6 14.4 14.0  HCT 53.0* 46.2 51.7 51.2 50.2  MCV  --  75.2*  75.8* 76.2* 78.2*  PLT  --  262 295 252 242   Basic Metabolic Panel: Recent Labs  Lab 03/26/22 1444 03/26/22 1605 03/26/22 2107 03/27/22 0652 03/28/22 0044 03/29/22 0155  NA 135 134* 136 136 139 139  K 4.7 4.2 4.5 4.5 3.9 4.1  CL 94*  --  94* 94* 97* 95*  CO2 28  --  30 31 35* 37*  GLUCOSE 133*  --  142* 135* 116* 113*  BUN 11  --  16 22* 24* 16  CREATININE 1.60*  --  1.97* 2.36* 1.26* 1.05  CALCIUM 8.6*  --  8.6* 8.7* 8.8* 8.8*  MG 1.6*  --   --  2.7*  --   --  PHOS 2.0*  --   --   --   --   --    Liver Function Tests: Recent Labs  Lab 03/26/22 1444  AST 16  ALT 15  ALKPHOS 77  BILITOT 0.4  PROT 6.7  ALBUMIN 3.3*   CBG: Recent Labs  Lab 03/29/22 1535 03/29/22 2013 03/29/22 2357 03/30/22 0353 03/30/22 0806  GLUCAP 89 236* 85 121* 140*    Discharge time spent: greater than 30 minutes.  Signed: Thad Ranger, MD [facility] 03/30/2022

## 2022-03-30 NOTE — TOC Transition Note (Signed)
Transition of Care [facility]) - CM/SW Discharge Note   Patient Details  Name: Luis Beard MRN: [id number] Date of Birth: 1981/02/28  [facility]) CM/SW Contact:  Bess Kinds, RN Phone Number: [phone] 03/30/2022, 9:14 AM   Clinical Narrative:     Patient to transition home today. No TOC needs identified.   Final next level of care: Home/Self Care Barriers to Discharge: No Barriers Identified   Patient Goals and CMS Choice        Discharge Placement                       Discharge Plan and Services                                     Social Determinants of Health (SDOH) Interventions     Readmission Risk Interventions     No data to display

## 2022-03-30 NOTE — Plan of Care (Signed)
  Problem: Education: Goal: Knowledge of General Education information will improve Description: Including pain rating scale, medication(s)/side effects and non-pharmacologic comfort measures Outcome: Adequate for Discharge   Problem: Health Behavior/Discharge Planning: Goal: Ability to manage health-related needs will improve Outcome: Adequate for Discharge   Problem: Clinical Measurements: Goal: Ability to maintain clinical measurements within normal limits will improve Outcome: Adequate for Discharge Goal: Will remain free from infection Outcome: Adequate for Discharge Goal: Diagnostic test results will improve Outcome: Adequate for Discharge Goal: Respiratory complications will improve Outcome: Adequate for Discharge Goal: Cardiovascular complication will be avoided Outcome: Adequate for Discharge   Problem: Activity: Goal: Risk for activity intolerance will decrease Outcome: Adequate for Discharge   Problem: Nutrition: Goal: Adequate nutrition will be maintained Outcome: Adequate for Discharge   Problem: Coping: Goal: Level of anxiety will decrease Outcome: Adequate for Discharge   Problem: Elimination: Goal: Will not experience complications related to bowel motility Outcome: Adequate for Discharge Goal: Will not experience complications related to urinary retention Outcome: Adequate for Discharge   Problem: Pain Managment: Goal: General experience of comfort will improve Outcome: Adequate for Discharge   Problem: Safety: Goal: Ability to remain free from injury will improve Outcome: Adequate for Discharge   Problem: Skin Integrity: Goal: Risk for impaired skin integrity will decrease Outcome: Adequate for Discharge   Problem: Activity: Goal: Ability to tolerate increased activity will improve Outcome: Adequate for Discharge   Problem: Respiratory: Goal: Ability to maintain a clear airway and adequate ventilation will improve Outcome: Adequate for  Discharge   Problem: Role Relationship: Goal: Method of communication will improve Outcome: Adequate for Discharge   Problem: Safety: Goal: Non-violent Restraint(s) Outcome: Adequate for Discharge   Problem: Education: Goal: Ability to describe self-care measures that may prevent or decrease complications (Diabetes Survival Skills Education) will improve Outcome: Adequate for Discharge Goal: Individualized Educational Video(s) Outcome: Adequate for Discharge   Problem: Coping: Goal: Ability to adjust to condition or change in health will improve Outcome: Adequate for Discharge   Problem: Fluid Volume: Goal: Ability to maintain a balanced intake and output will improve Outcome: Adequate for Discharge   Problem: Health Behavior/Discharge Planning: Goal: Ability to identify and utilize available resources and services will improve Outcome: Adequate for Discharge Goal: Ability to manage health-related needs will improve Outcome: Adequate for Discharge   Problem: Metabolic: Goal: Ability to maintain appropriate glucose levels will improve Outcome: Adequate for Discharge   Problem: Nutritional: Goal: Maintenance of adequate nutrition will improve Outcome: Adequate for Discharge Goal: Progress toward achieving an optimal weight will improve Outcome: Adequate for Discharge   Problem: Skin Integrity: Goal: Risk for impaired skin integrity will decrease Outcome: Adequate for Discharge   Problem: Tissue Perfusion: Goal: Adequacy of tissue perfusion will improve Outcome: Adequate for Discharge   

## 2022-03-30 NOTE — Progress Notes (Signed)
Attempted CPAP w/pt home nasal mask. Pt unable to tolerate.

## 2022-04-02 ENCOUNTER — Telehealth: Payer: Self-pay | Admitting: Family Medicine

## 2022-04-02 NOTE — Telephone Encounter (Signed)
noted 

## 2022-04-02 NOTE — Telephone Encounter (Signed)
Copied from CRM [id number]. Topic: General - Other >> Apr 02, 2022  1:30 PM De Blanch wrote: Reason for CRM: Rihanna nurse case manager from [employer]  Stated please reach out if the office has any Care coordination needs

## 2022-05-06 ENCOUNTER — Encounter: Payer: Self-pay | Admitting: Family Medicine

## 2022-05-06 ENCOUNTER — Ambulatory Visit (INDEPENDENT_AMBULATORY_CARE_PROVIDER_SITE_OTHER): Payer: Commercial Managed Care - HMO | Admitting: Family Medicine

## 2022-05-06 VITALS — BP 138/89 | HR 73 | Temp 97.7°F | Resp 16 | Wt 313.2 lb

## 2022-05-06 DIAGNOSIS — Z6841 Body Mass Index (BMI) 40.0 and over, adult: Secondary | ICD-10-CM

## 2022-05-06 DIAGNOSIS — G4733 Obstructive sleep apnea (adult) (pediatric): Secondary | ICD-10-CM | POA: Diagnosis not present

## 2022-05-06 DIAGNOSIS — I10 Essential (primary) hypertension: Secondary | ICD-10-CM

## 2022-05-07 ENCOUNTER — Encounter: Payer: Self-pay | Admitting: Family Medicine

## 2022-05-07 DIAGNOSIS — M26629 Arthralgia of temporomandibular joint, unspecified side: Secondary | ICD-10-CM | POA: Insufficient documentation

## 2022-05-07 DIAGNOSIS — H9201 Otalgia, right ear: Secondary | ICD-10-CM | POA: Insufficient documentation

## 2022-05-07 NOTE — Progress Notes (Signed)
Established Patient Office Visit  Subjective    Patient ID: Luis Beard, male    DOB: 1980-10-01  Age: 41 y.o. MRN: [id number]  CC:  Chief Complaint  Patient presents with   Follow-up    HPI Luis Beard presents for routine follow up of hypertension. Patient also reports that he recently had sinus surgery and spent a day in the ICU 2/2 anesthesia complications.    Outpatient Encounter Medications as of 05/06/2022  Medication Sig   albuterol (VENTOLIN HFA) 108 (90 Base) MCG/ACT inhaler Inhale 1-2 puffs into the lungs every 6 (six) hours as needed for wheezing or shortness of breath.   amLODipine (NORVASC) 10 MG tablet Take 1 tablet (10 mg total) by mouth daily.   fluticasone (FLONASE) 50 MCG/ACT nasal spray Place 2 sprays into the nose daily as needed for allergies.   fluticasone-salmeterol (ADVAIR DISKUS) 500-50 MCG/ACT AEPB Inhale 1 puff into the lungs in the morning and at bedtime.   hydrochlorothiazide (HYDRODIURIL) 25 MG tablet Take 1 tablet (25 mg total) by mouth daily.   hydrOXYzine (ATARAX) 50 MG tablet Take 1 tablet (50 mg total) by mouth at bedtime.   ibuprofen (ADVIL) 200 MG tablet Take 400 mg by mouth every 6 (six) hours as needed for mild pain or headache.   valsartan (DIOVAN) 320 MG tablet Take 1 tablet (320 mg total) by mouth daily.   [DISCONTINUED] cetirizine (ZYRTEC ALLERGY) 10 MG tablet Take 1 tablet (10 mg total) by mouth daily. (Patient not taking: Reported on 12/27/2019)   [DISCONTINUED] ipratropium (ATROVENT) 0.06 % nasal spray Place 2 sprays into both nostrils 4 (four) times daily.   No facility-administered encounter medications on file as of 05/06/2022.    Past Medical History:  Diagnosis Date   Asthma    Hypertension    Sleep apnea     Past Surgical History:  Procedure Laterality Date   ADENOIDECTOMY N/A 03/26/2022   Procedure: ADENOIDECTOMY;  Surgeon: Christia Reading, MD;  Location: [facility];  Service: ENT;  Laterality: N/A;    NASAL TURBINATE REDUCTION Bilateral 03/26/2022   Procedure: TURBINATE REDUCTION/SUBMUCOSAL RESECTION;  Surgeon: Christia Reading, MD;  Location: [facility];  Service: ENT;  Laterality: Bilateral;   SEPTOPLASTY Bilateral 03/26/2022   Procedure: SEPTOPLASTY;  Surgeon: Christia Reading, MD;  Location: [facility];  Service: ENT;  Laterality: Bilateral;    Family History  Problem Relation Age of Onset   Hypertension Mother    Diabetes Mother     Social History   Socioeconomic History   Marital status: Married    Spouse name: Not on file   Number of children: Not on file   Years of education: Not on file   Highest education level: Not on file  Occupational History   Not on file  Tobacco Use   Smoking status: Former    Types: Cigars    Quit date: 2022    Years since quitting: 1.6   Smokeless tobacco: Never  Vaping Use   Vaping Use: Never used  Substance and Sexual Activity   Alcohol use: Yes    Comment: occasionally   Drug use: Yes    Frequency: 7.0 times per week    Types: Marijuana    Comment: last time 03-25-22 (yesterday)   Sexual activity: Yes    Birth control/protection: Condom  Other Topics Concern   Not on file  Social History Narrative   Not on file   Social Determinants of Health   Financial  Resource Strain: Not on file  Food Insecurity: Not on file  Transportation Needs: Not on file  Physical Activity: Not on file  Stress: Not on file  Social Connections: Not on file  Intimate Partner Violence: Not on file    Review of Systems  All other systems reviewed and are negative.       Objective    BP 138/89   Pulse 73   Temp 97.7 F (36.5 C) (Oral)   Resp 16   Wt (!) 313 lb 3.2 oz (142.1 kg)   SpO2 91%   BMI 46.25 kg/m   Physical Exam Vitals and nursing note reviewed.  Constitutional:      General: He is not in acute distress.    Appearance: He is obese.  Cardiovascular:     Rate and Rhythm: Normal rate and regular  rhythm.  Pulmonary:     Effort: Pulmonary effort is normal.     Breath sounds: Normal breath sounds.  Abdominal:     Palpations: Abdomen is soft.     Tenderness: There is no abdominal tenderness.  Musculoskeletal:     Right lower leg: No edema.     Left lower leg: No edema.  Neurological:     General: No focal deficit present.     Mental Status: He is alert and oriented to person, place, and time.         Assessment & Plan:   1. Essential hypertension Appears stable. Continue and monitor  2. Obstructive sleep apnea Continue cpap (?improved with nasal surgery)  3. Class 3 severe obesity due to excess calories with serious comorbidity and body mass index (BMI) of 45.0 to 49.9 in adult [facility]) Discussed dietary and activity options.     Return in about 3 months (around 08/05/2022) for follow up.   [name], MD

## 2022-06-05 ENCOUNTER — Encounter: Payer: Self-pay | Admitting: Family Medicine

## 2022-06-05 ENCOUNTER — Ambulatory Visit (INDEPENDENT_AMBULATORY_CARE_PROVIDER_SITE_OTHER): Payer: Commercial Managed Care - HMO | Admitting: Family Medicine

## 2022-06-05 VITALS — BP 138/85 | HR 85 | Temp 98.0°F | Resp 18 | Wt 311.4 lb

## 2022-06-05 DIAGNOSIS — I10 Essential (primary) hypertension: Secondary | ICD-10-CM

## 2022-06-05 DIAGNOSIS — Z7689 Persons encountering health services in other specified circumstances: Secondary | ICD-10-CM

## 2022-06-05 DIAGNOSIS — Z6841 Body Mass Index (BMI) 40.0 and over, adult: Secondary | ICD-10-CM | POA: Diagnosis not present

## 2022-06-05 MED ORDER — VALSARTAN 320 MG PO TABS
320.0000 mg | ORAL_TABLET | Freq: Every day | ORAL | 0 refills | Status: DC
Start: 2022-06-05 — End: 2022-09-05

## 2022-06-05 MED ORDER — AMLODIPINE BESYLATE 10 MG PO TABS
10.0000 mg | ORAL_TABLET | Freq: Every day | ORAL | 0 refills | Status: DC
Start: 2022-06-05 — End: 2022-09-05

## 2022-06-05 MED ORDER — HYDROCHLOROTHIAZIDE 25 MG PO TABS: 25.0000 mg | ORAL_TABLET | Freq: Every day | ORAL | 0 refills | Status: DC

## 2022-06-05 NOTE — Progress Notes (Signed)
Established Patient Office Visit  Subjective    Patient ID: Luis Beard, male    DOB: [birth year]  Age: 41 y.o. MRN: [id number]  CC:  Chief Complaint  Patient presents with   Follow-up    HPI Luis Beard presents for follow up weight management. Patient reports that he has not done well with recommendations.    Outpatient Encounter Medications as of 06/05/2022  Medication Sig   ibuprofen (ADVIL) 800 MG tablet Take by mouth.   albuterol (VENTOLIN HFA) 108 (90 Base) MCG/ACT inhaler Inhale 1-2 puffs into the lungs every 6 (six) hours as needed for wheezing or shortness of breath.   amLODipine (NORVASC) 10 MG tablet Take 1 tablet (10 mg total) by mouth daily.   fluticasone (FLONASE) 50 MCG/ACT nasal spray Place 2 sprays into the nose daily as needed for allergies.   fluticasone-salmeterol (ADVAIR DISKUS) 500-50 MCG/ACT AEPB Inhale 1 puff into the lungs in the morning and at bedtime.   hydrochlorothiazide (HYDRODIURIL) 25 MG tablet Take 1 tablet (25 mg total) by mouth daily.   hydrOXYzine (ATARAX) 50 MG tablet Take 1 tablet (50 mg total) by mouth at bedtime.   ibuprofen (ADVIL) 200 MG tablet Take 400 mg by mouth every 6 (six) hours as needed for mild pain or headache.   valsartan (DIOVAN) 320 MG tablet Take 1 tablet (320 mg total) by mouth daily.   [DISCONTINUED] amLODipine (NORVASC) 10 MG tablet Take 1 tablet (10 mg total) by mouth daily.   [DISCONTINUED] cetirizine (ZYRTEC ALLERGY) 10 MG tablet Take 1 tablet (10 mg total) by mouth daily. (Patient not taking: Reported on 12/27/2019)   [DISCONTINUED] hydrochlorothiazide (HYDRODIURIL) 25 MG tablet Take 1 tablet (25 mg total) by mouth daily.   [DISCONTINUED] ipratropium (ATROVENT) 0.06 % nasal spray Place 2 sprays into both nostrils 4 (four) times daily.   [DISCONTINUED] valsartan (DIOVAN) 320 MG tablet Take 1 tablet (320 mg total) by mouth daily.   No facility-administered encounter medications on file as of 06/05/2022.    Past  Medical History:  Diagnosis Date   Asthma    Hypertension    Sleep apnea     Past Surgical History:  Procedure Laterality Date   ADENOIDECTOMY N/A 03/26/2022   Procedure: ADENOIDECTOMY;  Surgeon: Melida Quitter, MD;  Location: [place];  Service: ENT;  Laterality: N/A;   NASAL TURBINATE REDUCTION Bilateral 03/26/2022   Procedure: TURBINATE REDUCTION/SUBMUCOSAL RESECTION;  Surgeon: Melida Quitter, MD;  Location: [place];  Service: ENT;  Laterality: Bilateral;   SEPTOPLASTY Bilateral 03/26/2022   Procedure: SEPTOPLASTY;  Surgeon: Melida Quitter, MD;  Location: [place];  Service: ENT;  Laterality: Bilateral;    Family History  Problem Relation Age of Onset   Hypertension Mother    Diabetes Mother     Social History   Socioeconomic History   Marital status: Married    Spouse name: Not on file   Number of children: Not on file   Years of education: Not on file   Highest education level: Not on file  Occupational History   Not on file  Tobacco Use   Smoking status: Former    Types: Cigars    Quit date: 2022    Years since quitting: 1.7   Smokeless tobacco: Never  Vaping Use   Vaping Use: Never used  Substance and Sexual Activity   Alcohol use: Yes    Comment: occasionally   Drug use: Yes    Frequency: 7.0 times per week  Types: Marijuana    Comment: last time 03-25-22 (yesterday)   Sexual activity: Yes    Birth control/protection: Condom  Other Topics Concern   Not on file  Social History Narrative   Not on file   Social Determinants of Health   Financial Resource Strain: Not on file  Food Insecurity: Not on file  Transportation Needs: Not on file  Physical Activity: Not on file  Stress: Not on file  Social Connections: Not on file  Intimate Partner Violence: Not on file    Review of Systems  All other systems reviewed and are negative.       Objective    BP 138/85   Pulse 85   Temp 98 F (36.7 C)  (Oral)   Resp 18   Wt (!) 311 lb 6.4 oz (141.3 kg)   SpO2 90%   BMI 45.99 kg/m   Physical Exam Vitals and nursing note reviewed.  Constitutional:      General: He is not in acute distress.    Appearance: He is obese.  Cardiovascular:     Rate and Rhythm: Normal rate and regular rhythm.  Pulmonary:     Effort: Pulmonary effort is normal.     Breath sounds: Normal breath sounds.  Abdominal:     Palpations: Abdomen is soft.     Tenderness: There is no abdominal tenderness.  Musculoskeletal:     Right lower leg: No edema.     Left lower leg: No edema.  Neurological:     General: No focal deficit present.     Mental Status: He is alert and oriented to person, place, and time.         Assessment & Plan:   1. Encounter for weight management Discussed dietary and activity management. Goal is 3-5lbs/mo wt loss.   2. Class 3 severe obesity due to excess calories with serious comorbidity and body mass index (BMI) of 45.0 to 49.9 in adult [facility]) As above  3. Essential hypertension Appears stable with present management. continue    Return in about 6 months (around 12/05/2022) for follow up.   [name], MD

## 2022-09-05 ENCOUNTER — Encounter: Payer: Self-pay | Admitting: Family Medicine

## 2022-09-05 ENCOUNTER — Ambulatory Visit (INDEPENDENT_AMBULATORY_CARE_PROVIDER_SITE_OTHER): Payer: Commercial Managed Care - HMO | Admitting: Family Medicine

## 2022-09-05 VITALS — BP 135/83 | HR 82 | Temp 98.1°F | Resp 18 | Wt 297.8 lb

## 2022-09-05 DIAGNOSIS — G4733 Obstructive sleep apnea (adult) (pediatric): Secondary | ICD-10-CM | POA: Diagnosis not present

## 2022-09-05 DIAGNOSIS — Z6841 Body Mass Index (BMI) 40.0 and over, adult: Secondary | ICD-10-CM | POA: Diagnosis not present

## 2022-09-05 DIAGNOSIS — I10 Essential (primary) hypertension: Secondary | ICD-10-CM

## 2022-09-05 MED ORDER — AMLODIPINE BESYLATE 10 MG PO TABS
10.0000 mg | ORAL_TABLET | Freq: Every day | ORAL | 1 refills | Status: DC
Start: 2022-09-05 — End: 2023-08-13

## 2022-09-05 MED ORDER — HYDROCHLOROTHIAZIDE 25 MG PO TABS
25.0000 mg | ORAL_TABLET | Freq: Every day | ORAL | 1 refills | Status: DC
Start: 2022-09-05 — End: 2023-11-12

## 2022-09-05 MED ORDER — VALSARTAN 320 MG PO TABS
320.0000 mg | ORAL_TABLET | Freq: Every day | ORAL | 1 refills | Status: DC
Start: 2022-09-05 — End: 2023-09-17

## 2022-09-05 NOTE — Progress Notes (Unsigned)
Patient is here for their 3 month follow-up Patient has no concerns today Care gaps have been discussed with patient  

## 2022-09-09 ENCOUNTER — Encounter: Payer: Self-pay | Admitting: Family Medicine

## 2022-09-09 ENCOUNTER — Telehealth: Payer: Self-pay

## 2022-09-09 NOTE — Progress Notes (Signed)
Established Patient Office Visit  Subjective    Patient ID: Luis Beard, male    DOB: 1980/12/16  Age: 42 y.o. MRN: [id number]  CC:  Chief Complaint  Patient presents with   Follow-up    HPI Luis Beard presents for routine follow up of chronic med issues. Patient denies acute complaints.    Outpatient Encounter Medications as of 09/05/2022  Medication Sig   albuterol (VENTOLIN HFA) 108 (90 Base) MCG/ACT inhaler Inhale 1-2 puffs into the lungs every 6 (six) hours as needed for wheezing or shortness of breath.   amLODipine (NORVASC) 10 MG tablet Take 1 tablet (10 mg total) by mouth daily.   fluticasone (FLONASE) 50 MCG/ACT nasal spray Place 2 sprays into the nose daily as needed for allergies.   fluticasone-salmeterol (ADVAIR DISKUS) 500-50 MCG/ACT AEPB Inhale 1 puff into the lungs in the morning and at bedtime.   hydrochlorothiazide (HYDRODIURIL) 25 MG tablet Take 1 tablet (25 mg total) by mouth daily.   hydrOXYzine (ATARAX) 50 MG tablet Take 1 tablet (50 mg total) by mouth at bedtime.   ibuprofen (ADVIL) 200 MG tablet Take 400 mg by mouth every 6 (six) hours as needed for mild pain or headache.   ibuprofen (ADVIL) 800 MG tablet Take by mouth.   valsartan (DIOVAN) 320 MG tablet Take 1 tablet (320 mg total) by mouth daily.   [DISCONTINUED] amLODipine (NORVASC) 10 MG tablet Take 1 tablet (10 mg total) by mouth daily.   [DISCONTINUED] cetirizine (ZYRTEC ALLERGY) 10 MG tablet Take 1 tablet (10 mg total) by mouth daily. (Patient not taking: Reported on 12/27/2019)   [DISCONTINUED] hydrochlorothiazide (HYDRODIURIL) 25 MG tablet Take 1 tablet (25 mg total) by mouth daily.   [DISCONTINUED] ipratropium (ATROVENT) 0.06 % nasal spray Place 2 sprays into both nostrils 4 (four) times daily.   [DISCONTINUED] valsartan (DIOVAN) 320 MG tablet Take 1 tablet (320 mg total) by mouth daily.   No facility-administered encounter medications on file as of 09/05/2022.    Past Medical History:   Diagnosis Date   Asthma    Hypertension    Sleep apnea     Past Surgical History:  Procedure Laterality Date   ADENOIDECTOMY N/A 03/26/2022   Procedure: ADENOIDECTOMY;  Surgeon: Melida Quitter, MD;  Location: [place];  Service: ENT;  Laterality: N/A;   NASAL TURBINATE REDUCTION Bilateral 03/26/2022   Procedure: TURBINATE REDUCTION/SUBMUCOSAL RESECTION;  Surgeon: Melida Quitter, MD;  Location: [place];  Service: ENT;  Laterality: Bilateral;   SEPTOPLASTY Bilateral 03/26/2022   Procedure: SEPTOPLASTY;  Surgeon: Melida Quitter, MD;  Location: [place];  Service: ENT;  Laterality: Bilateral;    Family History  Problem Relation Age of Onset   Hypertension Mother    Diabetes Mother     Social History   Socioeconomic History   Marital status: Married    Spouse name: Not on file   Number of children: Not on file   Years of education: Not on file   Highest education level: Not on file  Occupational History   Not on file  Tobacco Use   Smoking status: Former    Types: Cigars    Quit date: 2022    Years since quitting: 2.0   Smokeless tobacco: Never  Vaping Use   Vaping Use: Never used  Substance and Sexual Activity   Alcohol use: Yes    Comment: occasionally   Drug use: Yes    Frequency: 7.0 times per week  Types: Marijuana    Comment: last time 03-25-22 (yesterday)   Sexual activity: Yes    Birth control/protection: Condom  Other Topics Concern   Not on file  Social History Narrative   Not on file   Social Determinants of Health   Financial Resource Strain: Not on file  Food Insecurity: Not on file  Transportation Needs: Not on file  Physical Activity: Not on file  Stress: Not on file  Social Connections: Not on file  Intimate Partner Violence: Not on file    Review of Systems  All other systems reviewed and are negative.       Objective    BP 135/83   Pulse 82   Temp 98.1 F (36.7 C) (Oral)   Resp  18   Wt 297 lb 12.8 oz (135.1 kg)   SpO2 (!) 89%   BMI 43.98 kg/m   Physical Exam Vitals and nursing note reviewed.  Constitutional:      General: He is not in acute distress.    Appearance: He is obese.  Cardiovascular:     Rate and Rhythm: Normal rate and regular rhythm.  Pulmonary:     Effort: Pulmonary effort is normal.     Breath sounds: Normal breath sounds.  Abdominal:     Palpations: Abdomen is soft.     Tenderness: There is no abdominal tenderness.  Musculoskeletal:     Right lower leg: No edema.     Left lower leg: No edema.  Neurological:     General: No focal deficit present.     Mental Status: He is alert and oriented to person, place, and time.         Assessment & Plan:   1. Essential hypertension Appears stable. continue  2. Obstructive sleep apnea Referral for sleep study (cpap settings needed) - Nocturnal polysomnography (NPSG); Future  3. Class 3 severe obesity due to excess calories with serious comorbidity and body mass index (BMI) of 40.0 to 44.9 in adult [facility])     Return in about 6 months (around 03/06/2023) for follow up.   Luis Raymond, MD

## 2022-09-09 NOTE — Telephone Encounter (Signed)
Pt LVM on my phone to check status of referral for sleep. Called pt back and did not get an answer so I LVM that we do not have a referral for him to be seen in our office. It looks like the last referral placed regarding sleep was placed to [facility] and he would need to follow up with that office or the referring provider on the status of his referral.

## 2022-09-11 ENCOUNTER — Telehealth: Payer: Self-pay | Admitting: *Deleted

## 2022-09-11 NOTE — Telephone Encounter (Signed)
Patient has been called and everything has been strengthen out. Copied from [place] [id number]. Topic: General - Other >> Sep 09, 2022  2:12 PM Everette C wrote: Reason for CRM: The patient has been told by their neurologists office to contact [facility] regarding their orders for a sleep study   The patient has been told that the orders for their study have been cancelled by a Dr. Amalia Hailey (who was unknown by [facility])   The patient would like to speak with a member of clinical staff further when possible regarding recreation of the orders   Please contact further when possible

## 2022-09-24 ENCOUNTER — Ambulatory Visit (INDEPENDENT_AMBULATORY_CARE_PROVIDER_SITE_OTHER): Payer: Commercial Managed Care - HMO | Admitting: Neurology

## 2022-09-24 ENCOUNTER — Encounter: Payer: Self-pay | Admitting: Neurology

## 2022-09-24 VITALS — BP 148/110 | HR 71 | Ht 69.0 in | Wt 301.6 lb

## 2022-09-24 DIAGNOSIS — G4733 Obstructive sleep apnea (adult) (pediatric): Secondary | ICD-10-CM

## 2022-09-24 DIAGNOSIS — R030 Elevated blood-pressure reading, without diagnosis of hypertension: Secondary | ICD-10-CM

## 2022-09-24 DIAGNOSIS — Z9889 Other specified postprocedural states: Secondary | ICD-10-CM

## 2022-09-24 DIAGNOSIS — R0683 Snoring: Secondary | ICD-10-CM | POA: Diagnosis not present

## 2022-09-24 NOTE — Patient Instructions (Addendum)
It was nice to see you again today. Your blood pressure is rather high today.  Please make sure you take your blood pressure medication when you get home.  Please try not to skip any days with your blood pressure medication.   As discussed, we will proceed with a home sleep test (HST) to re-evaluate your sleep apnea diagnosis and to get you a CPAP machine, if you qualify. Our sleep lab staff will reach out to you to arrange for pickup and for tutorial of your test equipment - you will do the test at home that night and bring the test sensors back for data analysis the next day or whenever you are scheduled for drop off of your test equipment. I will write for a new machine after your HST confirms your obstructive sleep apnea diagnosis.   We will schedule a follow-up appointment after set up with your new machine, typically within 31 to 89 days post treatment start. You will need to show compliance with usage and fulfill a minimum usage percentage (this is an insurance requirement).

## 2022-09-24 NOTE — Progress Notes (Signed)
Subjective:    Patient ID: Luis Beard is a 42 y.o. male.  HPI     Interim history:   Luis Beard is a 42 year old right-handed gentleman with an underlying medical history of sinusitis, allergic rhinitis, asthma, and obesity, status post turbinate reduction, adenoidectomy and septoplasty in July 2023, who presents for reevaluation of his sleep apnea.  The patient is unaccompanied today.  I first met him at the request of his ENT surgeon on 02/05/2021, at which time the patient reported a prior diagnosis of obstructive sleep apnea.  He was diagnosed with severe OSA in 2017.  He was advised to proceed with reevaluation with a sleep study but did not pursue sleep testing at the time.  He presents after a longer gap of over 18 months. He had interim Nasal surgery under Dr. Jenne Pane on 03/26/2022 with turbinate reduction, submucosal resection, adenoidectomy and septoplasty.   Today, 09/24/2022: He reports that his breathing has improved since his nasal surgery.  His snoring is even better according to his wife and he no longer wakes up with bouts of cough at night.  He still has snoring and occasional apneas and would like to get evaluated for sleep apnea with a home sleep test if possible.  He has never been on CPAP therapy even after he was diagnosed in 2017.  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  His Epworth sleepiness score is 10 out of 24, fatigue severity score is 46 out of 63.  He does not drink caffeine daily, maybe soda 2-3 times a week.  He is working on weight loss and has lost a little bit of weight.  The patient's allergies, current medications, family history, past medical history, past social history, past surgical history and problem list were reviewed and updated as appropriate.   Previously:   02/05/21: (He) was previously diagnosed with obstructive sleep apnea.  He had a split-night sleep study at [facility] sleep center on 04/09/2016 and I was able to review the  report.  Baseline AHI was 35.9/h, he was titrated on CPAP to a pressure of 15 cm.  Study was interpreted by Dr. Jetty Duhamel.  I reviewed your office note from 12/04/2020.  He has inferior turbinate hypertrophy.  Conservative treatment and surgical treatment were discussed.  He has not been on CPAP therapy.  His Epworth sleepiness score is 14 out of 24, fatigue severity score is 34 out of 63.  He is married and lives with his family including wife and 4 children, ages 40, 68, 6 and 52.  They do not have any pets in the household, he has a TV in his bedroom but it tends to stay on at night.  He goes to bed typically before midnight and rise time is around 7 AM.  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  He smokes cigars about 3/day, non-smoker of cigarettes, alcohol occasionally, maybe once a month and caffeine occasionally in the form of soda.  He has nocturia about 3-4 times per average night and denies recurrent morning headaches.  His sister has sleep apnea and has a CPAP machine.  He has had weight fluctuation, when he was placed on phentermine by his primary care physician, he was able to lose about 50 pounds but after he stopped the phentermine he gained most of the weight back.      His Past Medical History Is Significant For: Past Medical History:  Diagnosis Date   Asthma  Hypertension    Sleep apnea     His Past Surgical History Is Significant For: Past Surgical History:  Procedure Laterality Date   ADENOIDECTOMY N/A 03/26/2022   Procedure: ADENOIDECTOMY;  Surgeon: Christia Reading, MD;  Location: [facility];  Service: ENT;  Laterality: N/A;   NASAL TURBINATE REDUCTION Bilateral 03/26/2022   Procedure: TURBINATE REDUCTION/SUBMUCOSAL RESECTION;  Surgeon: Christia Reading, MD;  Location: [facility];  Service: ENT;  Laterality: Bilateral;   SEPTOPLASTY Bilateral 03/26/2022   Procedure: SEPTOPLASTY;  Surgeon: Christia Reading, MD;  Location: [place];  Service: ENT;  Laterality: Bilateral;    His Family History Is Significant For: Family History  Problem Relation Age of Onset   Hypertension Mother    Diabetes Mother    Heart failure Sister    Obstructive Sleep Apnea Sister     His Social History Is Significant For: Social History   Socioeconomic History   Marital status: Married    Spouse name: Not on file   Number of children: Not on file   Years of education: Not on file   Highest education level: Not on file  Occupational History   Not on file  Tobacco Use   Smoking status: Former    Types: Cigars    Quit date: 2022    Years since quitting: 2.0   Smokeless tobacco: Never  Vaping Use   Vaping Use: Never used  Substance and Sexual Activity   Alcohol use: Yes    Comment: occasionally   Drug use: Yes    Frequency: 2.0 times per week    Types: Marijuana    Comment: last time 03-25-22 (yesterday)   Sexual activity: Yes    Birth control/protection: Condom  Other Topics Concern   Not on file  Social History Narrative   Caffiene soda 2-3 week.   Working daytime    Social Determinants of Corporate investment banker Strain: Not on file  Food Insecurity: Not on file  Transportation Needs: Not on file  Physical Activity: Not on file  Stress: Not on file  Social Connections: Not on file    His Allergies Are:  No Known Allergies:   His Current Medications Are:  Outpatient Encounter Medications as of 09/24/2022  Medication Sig   albuterol (VENTOLIN HFA) 108 (90 Base) MCG/ACT inhaler Inhale 1-2 puffs into the lungs every 6 (six) hours as needed for wheezing or shortness of breath.   amLODipine (NORVASC) 10 MG tablet Take 1 tablet (10 mg total) by mouth daily.   fluticasone (FLONASE) 50 MCG/ACT nasal spray Place 2 sprays into the nose daily as needed for allergies.   hydrochlorothiazide (HYDRODIURIL) 25 MG tablet Take 1 tablet (25 mg total) by mouth daily.   hydrOXYzine (ATARAX) 50 MG tablet  Take 1 tablet (50 mg total) by mouth at bedtime.   valsartan (DIOVAN) 320 MG tablet Take 1 tablet (320 mg total) by mouth daily.   [DISCONTINUED] cetirizine (ZYRTEC ALLERGY) 10 MG tablet Take 1 tablet (10 mg total) by mouth daily. (Patient not taking: Reported on 12/27/2019)   [DISCONTINUED] fluticasone-salmeterol (ADVAIR DISKUS) 500-50 MCG/ACT AEPB Inhale 1 puff into the lungs in the morning and at bedtime.   [DISCONTINUED] ibuprofen (ADVIL) 200 MG tablet Take 400 mg by mouth every 6 (six) hours as needed for mild pain or headache.   [DISCONTINUED] ibuprofen (ADVIL) 800 MG tablet Take by mouth.   [DISCONTINUED] ipratropium (ATROVENT) 0.06 % nasal spray Place 2 sprays into both nostrils  4 (four) times daily.   No facility-administered encounter medications on file as of 09/24/2022.  :  Review of Systems:  Out of a complete 14 point review of systems, all are reviewed and negative with the exception of these symptoms as listed below:  Review of Systems  Objective:  Neurological Exam  Physical Exam Physical Examination:   Vitals:   09/24/22 1313 09/24/22 1322  BP: (!) 160/106 (!) 148/110  Pulse: 75 71    General Examination: The patient is a very pleasant 42 y.o. male in no acute distress. He appears well-developed and well-nourished and well groomed.   HEENT: Normocephalic, atraumatic, pupils are equal, round and reactive to light, extraocular tracking is good without limitation to gaze excursion or nystagmus noted. Hearing is grossly intact. Face is symmetric with normal facial animation. Speech is clear with no dysarthria noted. There is no hypophonia. There is no lip, neck/head, jaw or voice tremor. Neck is supple with full range of passive and active motion. There are no carotid bruits on auscultation. Oropharynx exam reveals: mild mouth dryness, good dental hygiene and marked airway crowding, due to small airway entry, thicker soft palate, larger uvula, tonsils not fully visualized,  Mallampati class IV, neck circumference 18-1/2 inches. He has a minimal overbite.  Tongue protrudes centrally and palate elevates symmetrically.  Nasal inspection shows no significant deviated septum, no significant residual congestion or inferior turbinate hypertrophy.   Chest: Clear to auscultation without wheezing, rhonchi or crackles noted.   Heart: S1+S2+0, regular and normal without murmurs, rubs or gallops noted.    Abdomen: Soft, non-tender and non-distended.   Extremities: There is no obvious edema in the distal lower extremities bilaterally.    Skin: Warm and dry without trophic changes noted.    Musculoskeletal: exam reveals no obvious joint deformities.    Neurologically:  Mental status: The patient is awake, alert and oriented in all 4 spheres. His immediate and remote memory, attention, language skills and fund of knowledge are appropriate. There is no evidence of aphasia, agnosia, apraxia or anomia. Speech is clear with normal prosody and enunciation. Thought process is linear. Mood is normal and affect is normal.  Cranial nerves II - XII are as described above under HEENT exam.  Motor exam: Normal bulk, strength and tone is noted. There is no obvious resting or action tremor.  Fine motor skills and coordination: grossly intact.  Cerebellar testing: No dysmetria or intention tremor. There is no truncal or gait ataxia.  Sensory exam: intact to light touch in the upper and lower extremities.  Gait, station and balance: He stands easily. No veering to one side is noted. No leaning to one side is noted. Posture is age-appropriate and stance is narrow based. Gait shows normal stride length and normal pace. No problems turning are noted.    Assessment and Plan:    In summary, Luis Beard is a very pleasant 42 year old right-handed gentleman with an underlying medical history of sinusitis, allergic rhinitis, asthma, and obesity, status post turbinate reduction, adenoidectomy and  septoplasty in July 2023, who presents for reevaluation of his sleep apnea.  He was diagnosed with severe obstructive sleep apnea in 2017 but has not been on PAP therapy.  He did not pursue sleep testing in 2022 when we first met.  He has had interim nasal surgery in July 2023.  Nasal breathing is better, nasal congestion improved and snoring also better but he still has witnessed apneas and is at risk for underlying sleep apnea.  He is agreeable to pursue testing at this time, I will order home sleep test for reevaluation.  We will call him to get his test scheduled.  We will reconvene after testing and consider treatment with a CPAP or AutoPap machine.   I answered all his questions today and the patient was in agreement.  I spent 30 minutes in total face-to-face time and in reviewing records during pre-charting, more than 50% of which was spent in counseling and coordination of care, reviewing test results, reviewing medications and treatment regimen and/or in discussing or reviewing the diagnosis of OSA, the prognosis and treatment options. Pertinent laboratory and imaging test results that were available during this visit with the patient were reviewed by me and considered in my medical decision making (see chart for details).

## 2022-10-08 ENCOUNTER — Ambulatory Visit
Admission: EM | Admit: 2022-10-08 | Discharge: 2022-10-08 | Disposition: A | Discharge status: Home / Self Care | Payer: Commercial Managed Care - HMO | Attending: Physician Assistant | Admitting: Physician Assistant

## 2022-10-08 DIAGNOSIS — Z1152 Encounter for screening for COVID-19: Secondary | ICD-10-CM | POA: Insufficient documentation

## 2022-10-08 DIAGNOSIS — J069 Acute upper respiratory infection, unspecified: Secondary | ICD-10-CM | POA: Insufficient documentation

## 2022-10-08 LAB — POCT RAPID STREP A (OFFICE): Rapid Strep A Screen: NEGATIVE

## 2022-10-08 NOTE — ED Triage Notes (Signed)
Pt presents with sore throat, congestion, and productive cough with yellow mucous X 2 days.

## 2022-10-08 NOTE — ED Provider Notes (Signed)
[facility]    CSN: [id number] Arrival date & time: 10/08/22  1440      History   Chief Complaint Chief Complaint  Patient presents with   URI    HPI Luis Beard is a 42 y.o. male.   Patient here today for evaluation of nasal congestion and sore throat he has had for 2 days.  He has had some productive cough as well.  He has not had any fever.  He denies any nausea, vomiting but has had some diarrhea.  He has been taken over-the-counter medication with mild relief.  Other family members have similar symptoms.  BP is markedly elevated in office. Patient denies any chest pain or shortness of breath. Does not report headache. Notes he has had this occur in the past when he has missed doses of medication and it has taken a few days to return to normal.   The history is provided by the patient.  URI Presenting symptoms: congestion, cough and sore throat   Presenting symptoms: no ear pain and no fever     Past Medical History:  Diagnosis Date   Asthma    Hypertension    Sleep apnea     Patient Active Problem List   Diagnosis Date Noted   Referred otalgia of right ear 05/07/2022   Temporomandibular joint (TMJ) pain 05/07/2022   Acute hypoxemic respiratory failure ([facility]) 03/26/2022   Acute recurrent pansinusitis 12/18/2015   Nasal turbinate hypertrophy 12/18/2015   Obstructive sleep apnea 12/18/2015   Seasonal allergic rhinitis 12/18/2015    Past Surgical History:  Procedure Laterality Date   ADENOIDECTOMY N/A 03/26/2022   Procedure: ADENOIDECTOMY;  Surgeon: Melida Quitter, MD;  Location: [facility];  Service: ENT;  Laterality: N/A;   NASAL TURBINATE REDUCTION Bilateral 03/26/2022   Procedure: TURBINATE REDUCTION/SUBMUCOSAL RESECTION;  Surgeon: Melida Quitter, MD;  Location: [place] [facility];  Service: ENT;  Laterality: Bilateral;   SEPTOPLASTY Bilateral 03/26/2022   Procedure: SEPTOPLASTY;  Surgeon: Melida Quitter, MD;  Location: [place];  Service: ENT;  Laterality: Bilateral;       Home Medications    Prior to Admission medications   Medication Sig Start Date End Date Taking? Authorizing Provider  albuterol (VENTOLIN HFA) 108 (90 Base) MCG/ACT inhaler Inhale 1-2 puffs into the lungs every 6 (six) hours as needed for wheezing or shortness of breath. 06/05/20   Hall-Potvin, [place], PA-C  amLODipine (NORVASC) 10 MG tablet Take 1 tablet (10 mg total) by mouth daily. 09/05/22   Dorna Mai, MD  fluticasone [facility]) 50 MCG/ACT nasal spray Place 2 sprays into the nose daily as needed for allergies. 12/04/20   [provider]  hydrochlorothiazide (HYDRODIURIL) 25 MG tablet Take 1 tablet (25 mg total) by mouth daily. 09/05/22   Dorna Mai, MD  hydrOXYzine (ATARAX) 50 MG tablet Take 1 tablet (50 mg total) by mouth at bedtime. 03/30/22   Rai, Ripudeep K, MD  valsartan (DIOVAN) 320 MG tablet Take 1 tablet (320 mg total) by mouth daily. 09/05/22   Dorna Mai, MD  cetirizine (ZYRTEC ALLERGY) 10 MG tablet Take 1 tablet (10 mg total) by mouth daily. Patient not taking: Reported on 12/27/2019 01/19/19 12/27/19  Ok Edwards, PA-C  ipratropium (ATROVENT) 0.06 % nasal spray Place 2 sprays into both nostrils 4 (four) times daily. 12/27/19 06/05/20  Ok Edwards, PA-C    Family History Family History  Problem Relation Age of Onset   Hypertension Mother  Diabetes Mother    Heart failure Sister    Obstructive Sleep Apnea Sister     Social History Social History   Tobacco Use   Smoking status: Former    Types: Cigars    Quit date: 2022    Years since quitting: 2.1   Smokeless tobacco: Never  Vaping Use   Vaping Use: Never used  Substance Use Topics   Alcohol use: Yes    Comment: occasionally   Drug use: Yes    Frequency: 2.0 times per week    Types: Marijuana    Comment: last time 03-25-22 (yesterday)     Allergies   Patient has no known allergies.   Review of Systems Review of Systems   Constitutional:  Negative for chills and fever.  HENT:  Positive for congestion and sore throat. Negative for ear pain.   Eyes:  Negative for discharge and redness.  Respiratory:  Positive for cough. Negative for shortness of breath.   Gastrointestinal:  Positive for diarrhea. Negative for abdominal pain, nausea and vomiting.     Physical Exam Triage Vital Signs ED Triage Vitals  Enc Vitals Group     BP 10/08/22 1719 (!) 170/121     Pulse Rate 10/08/22 1719 74     Resp 10/08/22 1719 18     Temp 10/08/22 1719 98.2 F (36.8 C)     Temp Source 10/08/22 1719 Oral     SpO2 10/08/22 1719 92 %     Weight --      Height --      Head Circumference --      Peak Flow --      Pain Score 10/08/22 1720 5     Pain Loc --      Pain Edu? --      Excl. in [place]? --    No data found.  Updated Vital Signs BP (!) 170/121 (BP Location: Left Arm)   Pulse 74   Temp 98.2 F (36.8 C) (Oral)   Resp 18   SpO2 92%   Physical Exam Vitals and nursing note reviewed.  Constitutional:      General: He is not in acute distress.    Appearance: Normal appearance. He is not ill-appearing.  HENT:     Head: Normocephalic and atraumatic.     Nose: Congestion present.     Mouth/Throat:     Mouth: Mucous membranes are moist.     Pharynx: Oropharynx is clear. No oropharyngeal exudate or posterior oropharyngeal erythema.  Eyes:     Conjunctiva/sclera: Conjunctivae normal.  Cardiovascular:     Rate and Rhythm: Normal rate and regular rhythm.     Heart sounds: Normal heart sounds. No murmur heard. Pulmonary:     Effort: Pulmonary effort is normal. No respiratory distress.     Breath sounds: Normal breath sounds. No wheezing, rhonchi or rales.  Skin:    General: Skin is warm and dry.  Neurological:     Mental Status: He is alert.  Psychiatric:        Mood and Affect: Mood normal.        Thought Content: Thought content normal.      UC Treatments / Results  Labs (all labs ordered are listed, but  only abnormal results are displayed) Labs Reviewed  SARS CORONAVIRUS 2 (TAT 6-24 HRS)  POCT RAPID STREP A (OFFICE)    EKG   Radiology No results found.  Procedures Procedures (including critical care time)  Medications Ordered in UC  Medications - No data to display  Initial Impression / Assessment and Plan / UC Course  I have reviewed the triage vital signs and the nursing notes.  Pertinent labs & imaging results that were available during my care of the patient were reviewed by me and considered in my medical decision making (see chart for details).    Rapid strep negative in office.  Suspect likely viral etiology of symptoms.  Will screen for COVID.  Less concerning for influenza given lack of fever.  Recommend symptomatic treatment, increase fluids and follow-up if no gradual improvement or with any further concerns.  Recommend continued monitoring of blood pressure. No signs or symptoms concerning for emergent evaluation of hypertension tonight.   Final Clinical Impressions(s) / UC Diagnoses   Final diagnoses:  Acute upper respiratory infection  Encounter for screening for COVID-19   Discharge Instructions   None    ED Prescriptions   None    PDMP not reviewed this encounter.   Francene Finders, PA-C 10/08/22 [phone]

## 2022-10-09 LAB — SARS CORONAVIRUS 2 (TAT 6-24 HRS): SARS Coronavirus 2: NEGATIVE

## 2022-10-20 ENCOUNTER — Telehealth: Payer: Self-pay | Admitting: Neurology

## 2022-10-20 NOTE — Telephone Encounter (Signed)
HST- [employer] no auth req via auto machine ref # [id number].  Patient is scheduled at [facility] for 11/26/22 at 1 pm.  Mailed packet to the patient.

## 2022-10-30 ENCOUNTER — Telehealth: Payer: Commercial Managed Care - HMO | Admitting: Family Medicine

## 2022-10-30 ENCOUNTER — Ambulatory Visit: Payer: Self-pay

## 2022-10-30 DIAGNOSIS — B9689 Other specified bacterial agents as the cause of diseases classified elsewhere: Secondary | ICD-10-CM | POA: Diagnosis not present

## 2022-10-30 DIAGNOSIS — J208 Acute bronchitis due to other specified organisms: Secondary | ICD-10-CM

## 2022-10-30 MED ORDER — AMOXICILLIN-POT CLAVULANATE 875-125 MG PO TABS: 1.0000 | ORAL_TABLET | Freq: Two times a day (BID) | ORAL | 0 refills | Status: AC

## 2022-10-30 MED ORDER — PREDNISONE 20 MG PO TABS: 40.0000 mg | ORAL_TABLET | Freq: Every day | ORAL | 0 refills | Status: AC

## 2022-10-30 MED ORDER — BENZONATATE 100 MG PO CAPS: 100.0000 mg | ORAL_CAPSULE | Freq: Three times a day (TID) | ORAL | 0 refills | Status: DC | PRN

## 2022-10-30 NOTE — Telephone Encounter (Signed)
  Chief Complaint: URI s/s and dizziness Symptoms: Cough, BA, HA, eye discomfort - fever has resolved. Frequency: 2 weeks Pertinent Negatives: Patient denies Fever Disposition: []$ ED /[]$ Urgent Care (no appt availability in office) / []$ Appointment(In office/virtual)/ [x]$  [facility]/ []$ Home Care/ []$ Refused Recommended Disposition /[]$ [facility]/ []$  Follow-up with PCP Additional Notes: PT reports URI s/s for 2 weeks. Cough, BA, HA and eye discomfort. Fever has resolved. IBU helped with BA, HA and eye discomfort. Pt now reports mild dizziness. Pt is able to walk, but feels a bit woozy. Pt reports that he is drinking a lot of fluids.    Summary: cold and flu like symptoms   The patient has experienced body aches, elevated temperature, eye discomfort and a cough  The patient has tested negative for COVID 19  The patient shares that they have experienced their symptoms for roughly two weeks  The patient would like to be prescribed something for their discomfort     Reason for Disposition  [1] MODERATE dizziness (e.g., interferes with normal activities) AND [2] has NOT been evaluated by doctor (or NP/PA) for this  (Exception: Dizziness caused by heat exposure, sudden standing, or poor fluid intake.)  Answer Assessment - Initial Assessment Questions 1. DESCRIPTION: "Describe your dizziness."     Woozy 2. LIGHTHEADED: "Do you feel lightheaded?" (e.g., somewhat faint, woozy, weak upon standing)     Yes - woozy 3. VERTIGO: "Do you feel like either you or the room is spinning or tilting?" (i.e. vertigo)     no 4. SEVERITY: "How bad is it?"  "Do you feel like you are going to faint?" "Can you stand and walk?"   - MILD: Feels slightly dizzy, but walking normally.   - MODERATE: Feels unsteady when walking, but not falling; interferes with normal activities (e.g., school, work).   - SEVERE: Unable to walk without falling, or requires assistance to walk without falling; feels  like passing out now.      mild 5. ONSET:  "When did the dizziness begin?"     2 days 6. AGGRAVATING FACTORS: "Does anything make it worse?" (e.g., standing, change in head position)     IBU last night which helped eyes, and HA 7. HEART RATE: "Can you tell me your heart rate?" "How many beats in 15 seconds?"  (Note: not all patients can do this)        8. CAUSE: "What do you think is causing the dizziness?"     Unsure 9. RECURRENT SYMPTOM: "Have you had dizziness before?" If Yes, ask: "When was the last time?" "What happened that time?"      10. OTHER SYMPTOMS: "Do you have any other symptoms?" (e.g., fever, chest pain, vomiting, diarrhea, bleeding)       BA, HA, cough  Protocols used: Dizziness - Lightheadedness-A-AH

## 2022-10-30 NOTE — Patient Instructions (Signed)
Fabio Bering, thank you for joining Perlie Mayo, NP for today's virtual visit.  While this provider is not your primary care provider (PCP), if your PCP is located in our provider database this encounter information will be shared with them immediately following your visit.   [facility] account gives you access to today's visit and all your visits, tests, and labs performed at [facility] " click here if you don't have a [facility] account or go to [email or web address]  Consent: (Patient) Luis Beard provided verbal consent for this virtual visit at the beginning of the encounter.  Current Medications:  Current Outpatient Medications:    amoxicillin-clavulanate (AUGMENTIN) 875-125 MG tablet, Take 1 tablet by mouth 2 (two) times daily for 7 days., Disp: 14 tablet, Rfl: 0   benzonatate (TESSALON) 100 MG capsule, Take 1-2 capsules (100-200 mg total) by mouth 3 (three) times daily as needed for cough., Disp: 30 capsule, Rfl: 0   predniSONE (DELTASONE) 20 MG tablet, Take 2 tablets (40 mg total) by mouth daily with breakfast for 5 days., Disp: 10 tablet, Rfl: 0   albuterol (VENTOLIN HFA) 108 (90 Base) MCG/ACT inhaler, Inhale 1-2 puffs into the lungs every 6 (six) hours as needed for wheezing or shortness of breath., Disp: 18 g, Rfl: 0   amLODipine (NORVASC) 10 MG tablet, Take 1 tablet (10 mg total) by mouth daily., Disp: 90 tablet, Rfl: 1   fluticasone (FLONASE) 50 MCG/ACT nasal spray, Place 2 sprays into the nose daily as needed for allergies., Disp: , Rfl:    hydrochlorothiazide (HYDRODIURIL) 25 MG tablet, Take 1 tablet (25 mg total) by mouth daily., Disp: 90 tablet, Rfl: 1   hydrOXYzine (ATARAX) 50 MG tablet, Take 1 tablet (50 mg total) by mouth at bedtime., Disp: 30 tablet, Rfl: 0   valsartan (DIOVAN) 320 MG tablet, Take 1 tablet (320 mg total) by mouth daily., Disp: 90 tablet, Rfl: 1   Medications ordered in this encounter:  Meds ordered this  encounter  Medications   amoxicillin-clavulanate (AUGMENTIN) 875-125 MG tablet    Sig: Take 1 tablet by mouth 2 (two) times daily for 7 days.    Dispense:  14 tablet    Refill:  0    Order Specific Question:   Supervising Provider    Answer:   Chase Picket [id number]   benzonatate (TESSALON) 100 MG capsule    Sig: Take 1-2 capsules (100-200 mg total) by mouth 3 (three) times daily as needed for cough.    Dispense:  30 capsule    Refill:  0    Order Specific Question:   Supervising Provider    Answer:   Chase Picket [id number]   predniSONE (DELTASONE) 20 MG tablet    Sig: Take 2 tablets (40 mg total) by mouth daily with breakfast for 5 days.    Dispense:  10 tablet    Refill:  0    Order Specific Question:   Supervising Provider    Answer:   Chase Picket [id number]     *If you need refills on other medications prior to your next appointment, please contact your pharmacy*  Follow-Up: Call back or seek an in-person evaluation if the symptoms worsen or if the condition fails to improve as anticipated.  [facility] [phone]  Other Instructions  - Take meds as prescribed -Plain Mucinex (avoid decongestants as they raise your BP) - Rest voice - Use a cool mist humidifier especially during the winter  months when heat dries out the air. - Use saline nose sprays frequently to help soothe nasal passages if they are drying out. - Stay hydrated by drinking plenty of fluids - Keep thermostat turn down low to prevent drying out which can cause a dry cough. - For any cough or congestion- robitussin DM or Delsym as needed - For fever or aches or pains- take tylenol or ibuprofen as directed on bottle             * for fevers greater than 101 orally you may alternate ibuprofen and tylenol every 3 hours.  If you do not improve you will need a follow up visit in person.                   If you have been instructed to have an in-person evaluation today at a  local Urgent Care facility, please use the link below. It will take you to a list of all of our available [facility], including address, phone number and hours of operation. Please do not delay care.  [facility]  If you or a family member do not have a primary care provider, use the link below to schedule a visit and establish care. When you choose a [facility] primary care physician or advanced practice provider, you gain a long-term partner in health. Find a Primary Care Provider  Learn more about [facility]'s in-office and virtual care options: [facility]

## 2022-10-30 NOTE — Progress Notes (Signed)
Virtual Visit Consent   Luis Beard, you are scheduled for a virtual visit with a Luis Beard provider today. Just as with appointments in the office, your consent must be obtained to participate. Your consent will be active for this visit and any virtual visit you may have with one of our providers in the next 365 days. If you have a MyChart account, a copy of this consent can be sent to you electronically.  As this is a virtual visit, video technology does not allow for your provider to perform a traditional examination. This may limit your provider's ability to fully assess your condition. If your provider identifies any concerns that need to be evaluated in person or the need to arrange testing (such as labs, EKG, etc.), we will make arrangements to do so. Although advances in technology are sophisticated, we cannot ensure that it will always work on either your end or our end. If the connection with a video visit is poor, the visit may have to be switched to a telephone visit. With either a video or telephone visit, we are not always able to ensure that we have a secure connection.  By engaging in this virtual visit, you consent to the provision of healthcare and authorize for your insurance to be billed (if applicable) for the services provided during this visit. Depending on your insurance coverage, you may receive a charge related to this service.  I need to obtain your verbal consent now. Are you willing to proceed with your visit today? Luis Beard has provided verbal consent on 10/30/2022 for a virtual visit (video or telephone). Luis Mayo, NP  Date: 10/30/2022 11:42 AM  Virtual Visit via Video Note   I, Luis Beard, connected with  Luis Beard  ([id number], 1981/02/07) on 10/30/22 at 11:45 AM EST by a video-enabled telemedicine application and verified that I am speaking with the correct person using two identifiers.  Location: Patient: Virtual Visit Location Patient:  Home Provider: Virtual Visit Location Provider: Home Office   I discussed the limitations of evaluation and management by telemedicine and the availability of in person appointments. The patient expressed understanding and agreed to proceed.    History of Present Illness: Luis Beard is a 42 y.o. who identifies as a male who was assigned male at birth, and is being seen today for on going URI symptoms.  Onset was URI was start of Feb (had been exposed to the Flu)- never really resolved- reports cough and congestion have continued through the month. Associated symptoms are sore throat, congestion, sinus pain, cough, mucus production, and fever 101, body aches. Modifying factors are mucinex, cold OTC meds, Ibuprofen Denies chest pain, shortness of breath, ear pain  Exposure to sick contacts- unknown in last 2 weeks COVID test: negative- at home was neg 5 days ago Vaccines: none recently  Problems:  Patient Active Problem List   Diagnosis Date Noted   Referred otalgia of right ear 05/07/2022   Temporomandibular joint (TMJ) pain 05/07/2022   Acute hypoxemic respiratory failure ([facility]) 03/26/2022   Acute recurrent pansinusitis 12/18/2015   Nasal turbinate hypertrophy 12/18/2015   Obstructive sleep apnea 12/18/2015   Seasonal allergic rhinitis 12/18/2015    Allergies: No Known Allergies Medications:  Current Outpatient Medications:    albuterol (VENTOLIN HFA) 108 (90 Base) MCG/ACT inhaler, Inhale 1-2 puffs into the lungs every 6 (six) hours as needed for wheezing or shortness of breath., Disp: 18 g, Rfl: 0   amLODipine (NORVASC) 10 MG tablet,  Take 1 tablet (10 mg total) by mouth daily., Disp: 90 tablet, Rfl: 1   fluticasone (FLONASE) 50 MCG/ACT nasal spray, Place 2 sprays into the nose daily as needed for allergies., Disp: , Rfl:    hydrochlorothiazide (HYDRODIURIL) 25 MG tablet, Take 1 tablet (25 mg total) by mouth daily., Disp: 90 tablet, Rfl: 1   hydrOXYzine (ATARAX) 50 MG tablet,  Take 1 tablet (50 mg total) by mouth at bedtime., Disp: 30 tablet, Rfl: 0   valsartan (DIOVAN) 320 MG tablet, Take 1 tablet (320 mg total) by mouth daily., Disp: 90 tablet, Rfl: 1  Observations/Objective: Patient is well-developed, well-nourished in no acute distress.  Resting comfortably  at home.  Head is normocephalic, atraumatic.  No labored breathing.  Speech is clear and coherent with logical content.  Patient is alert and oriented at baseline.  Cough  Congestion   Assessment and Plan:  1. Acute bacterial bronchitis  - amoxicillin-clavulanate (AUGMENTIN) 875-125 MG tablet; Take 1 tablet by mouth 2 (two) times daily for 7 days.  Dispense: 14 tablet; Refill: 0 - benzonatate (TESSALON) 100 MG capsule; Take 1-2 capsules (100-200 mg total) by mouth 3 (three) times daily as needed for cough.  Dispense: 30 capsule; Refill: 0 - predniSONE (DELTASONE) 20 MG tablet; Take 2 tablets (40 mg total) by mouth daily with breakfast for 5 days.  Dispense: 10 tablet; Refill: 0  -in person is dizziness does not improve with ABX  - Take meds as prescribed -Mucinex as directed (avoid decongestants since you have high blood pressure) - Rest voice - Use a cool mist humidifier especially during the winter months when heat dries out the air. - Use saline nose sprays frequently to help soothe nasal passages if they are drying out. - Stay hydrated by drinking plenty of fluids - Keep thermostat turn down low to prevent drying out which can cause a dry cough. - For any cough or congestion- robitussin DM or Delsym as needed - For fever or aches or pains- take tylenol or ibuprofen as directed on bottle             * for fevers greater than 101 orally you may alternate ibuprofen and tylenol every 3 hours.  If you do not improve you will need a follow up visit in person.               Reviewed side effects, risks and benefits of medication.   Patient acknowledged agreement and understanding of the plan.    Past Medical, Surgical, Social History, Allergies, and Medications have been Reviewed.    Follow Up Instructions: I discussed the assessment and treatment plan with the patient. The patient was provided an opportunity to ask questions and all were answered. The patient agreed with the plan and demonstrated an understanding of the instructions.  A copy of instructions were sent to the patient via MyChart unless otherwise noted below.    The patient was advised to call back or seek an in-person evaluation if the symptoms worsen or if the condition fails to improve as anticipated.  Time:  I spent 10 minutes with the patient via telehealth technology discussing the above problems/concerns.    Luis Mayo, NP

## 2022-10-31 NOTE — Telephone Encounter (Signed)
I have attempted without success to contact this patient by phone to I left a message on answering machine.

## 2022-11-01 ENCOUNTER — Emergency Department (HOSPITAL_BASED_OUTPATIENT_CLINIC_OR_DEPARTMENT_OTHER): Payer: Commercial Managed Care - HMO | Admitting: Radiology

## 2022-11-01 ENCOUNTER — Other Ambulatory Visit: Payer: Self-pay

## 2022-11-01 ENCOUNTER — Emergency Department (HOSPITAL_BASED_OUTPATIENT_CLINIC_OR_DEPARTMENT_OTHER)
Admission: EM | Admit: 2022-11-01 | Discharge: 2022-11-01 | Disposition: A | Discharge status: Home / Self Care | Payer: Commercial Managed Care - HMO | Attending: Emergency Medicine | Admitting: Emergency Medicine

## 2022-11-01 ENCOUNTER — Encounter (HOSPITAL_BASED_OUTPATIENT_CLINIC_OR_DEPARTMENT_OTHER): Payer: Self-pay | Admitting: Emergency Medicine

## 2022-11-01 ENCOUNTER — Telehealth (HOSPITAL_BASED_OUTPATIENT_CLINIC_OR_DEPARTMENT_OTHER): Payer: Self-pay | Admitting: Emergency Medicine

## 2022-11-01 DIAGNOSIS — J019 Acute sinusitis, unspecified: Secondary | ICD-10-CM | POA: Diagnosis not present

## 2022-11-01 DIAGNOSIS — Z79899 Other long term (current) drug therapy: Secondary | ICD-10-CM | POA: Diagnosis not present

## 2022-11-01 DIAGNOSIS — I10 Essential (primary) hypertension: Secondary | ICD-10-CM | POA: Diagnosis not present

## 2022-11-01 DIAGNOSIS — J45909 Unspecified asthma, uncomplicated: Secondary | ICD-10-CM | POA: Insufficient documentation

## 2022-11-01 DIAGNOSIS — Z7951 Long term (current) use of inhaled steroids: Secondary | ICD-10-CM | POA: Insufficient documentation

## 2022-11-01 DIAGNOSIS — Z20822 Contact with and (suspected) exposure to covid-19: Secondary | ICD-10-CM | POA: Insufficient documentation

## 2022-11-01 DIAGNOSIS — J101 Influenza due to other identified influenza virus with other respiratory manifestations: Secondary | ICD-10-CM | POA: Insufficient documentation

## 2022-11-01 DIAGNOSIS — R519 Headache, unspecified: Secondary | ICD-10-CM | POA: Diagnosis present

## 2022-11-01 LAB — CBC WITH DIFFERENTIAL/PLATELET
Abs Immature Granulocytes: 0.03 10*3/uL (ref 0.00–0.07)
Basophils Absolute: 0 10*3/uL (ref 0.0–0.1)
Basophils Relative: 1 %
Eosinophils Absolute: 0 10*3/uL (ref 0.0–0.5)
Eosinophils Relative: 0 %
HCT: 54.2 % — ABNORMAL HIGH (ref 39.0–52.0)
Hemoglobin: 16.4 g/dL (ref 13.0–17.0)
Immature Granulocytes: 1 %
Lymphocytes Relative: 19 %
Lymphs Abs: 1 10*3/uL (ref 0.7–4.0)
MCH: 23.4 pg — ABNORMAL LOW (ref 26.0–34.0)
MCHC: 30.3 g/dL (ref 30.0–36.0)
MCV: 77.2 fL — ABNORMAL LOW (ref 80.0–100.0)
Monocytes Absolute: 0.7 10*3/uL (ref 0.1–1.0)
Monocytes Relative: 14 %
Neutro Abs: 3.4 10*3/uL (ref 1.7–7.7)
Neutrophils Relative %: 65 %
Platelets: 247 10*3/uL (ref 150–400)
RBC: 7.02 MIL/uL — ABNORMAL HIGH (ref 4.22–5.81)
RDW: 18.9 % — ABNORMAL HIGH (ref 11.5–15.5)
WBC: 5.2 10*3/uL (ref 4.0–10.5)
nRBC: 0 % (ref 0.0–0.2)

## 2022-11-01 LAB — BASIC METABOLIC PANEL
Anion gap: 13 (ref 5–15)
BUN: 18 mg/dL (ref 6–20)
CO2: 26 mmol/L (ref 22–32)
Calcium: 9.1 mg/dL (ref 8.9–10.3)
Chloride: 94 mmol/L — ABNORMAL LOW (ref 98–111)
Creatinine, Ser: 1.3 mg/dL — ABNORMAL HIGH (ref 0.61–1.24)
GFR, Estimated: >60 mL/min (ref >60)
Glucose, Bld: 114 mg/dL — ABNORMAL HIGH (ref 70–99)
Potassium: 3.5 mmol/L (ref 3.5–5.1)
Sodium: 133 mmol/L — ABNORMAL LOW (ref 135–145)

## 2022-11-01 LAB — RESP PANEL BY RT-PCR (RSV, FLU A&B, COVID)  RVPGX2
Influenza A by PCR: NEGATIVE
Influenza B by PCR: POSITIVE — AB
Resp Syncytial Virus by PCR: NEGATIVE
SARS Coronavirus 2 by RT PCR: NEGATIVE

## 2022-11-01 MED ORDER — GUAIFENESIN-CODEINE 100-10 MG/5ML PO SOLN: 5.0000 mL | Freq: Three times a day (TID) | ORAL | 0 refills | Status: DC | PRN

## 2022-11-01 MED ORDER — GUAIFENESIN-CODEINE 100-10 MG/5ML PO SOLN
5.0000 mL | Freq: Once | ORAL | Status: AC
  Administered 2022-11-01: 5 mL via ORAL
  Filled 2022-11-01: qty 5

## 2022-11-01 MED ORDER — ACETAMINOPHEN 500 MG PO TABS
1000.0000 mg | ORAL_TABLET | Freq: Four times a day (QID) | ORAL | Status: DC | PRN
  Administered 2022-11-01: 1000 mg via ORAL
  Filled 2022-11-01: qty 2

## 2022-11-01 MED ORDER — DOXYCYCLINE HYCLATE 100 MG PO CAPS: 100.0000 mg | ORAL_CAPSULE | Freq: Two times a day (BID) | ORAL | 0 refills | Status: AC

## 2022-11-01 MED ORDER — ALBUTEROL SULFATE (2.5 MG/3ML) 0.083% IN NEBU
2.5000 mg | INHALATION_SOLUTION | Freq: Once | RESPIRATORY_TRACT | Status: AC
  Administered 2022-11-01: 2.5 mg via RESPIRATORY_TRACT
  Filled 2022-11-01: qty 3

## 2022-11-01 NOTE — ED Provider Notes (Signed)
[facility] Provider Note   CSN: [id number] Arrival date & time: 11/01/22  1524     History  No chief complaint on file.   Luis Beard is a 42 y.o. male.  With history of asthma, hypertension and sleep apnea who presents to the ED for evaluation of headache, congestion, cough.  Symptoms began on February 7.  Have progressed to today.  He was initially seen and diagnosed with upper respiratory infection and started taking an antihistamine and Flonase.  He did not have any relief in his symptoms after this.  He then had a virtual appointment and was diagnosed with bronchitis and started on amoxicillin and prednisone.  He still did not have any improvement in his symptoms so he returned to the ED today.  His cough is productive of yellow sputum.  He reports fevers and chills at home.  Checked his temperature on Thursday and found it to be 101.5 F.  Took Tylenol at that time.  Has not checked his temperature since then.  Reports some shortness of breath after episodes of coughing but denies shortness of breath at baseline.  Denies chest pain.  States he has bodyaches due to his coughing.  He endorses occasional smoking of marijuana but denies daily smoking.  HPI     Home Medications Prior to Admission medications   Medication Sig Start Date End Date Taking? Authorizing Provider  doxycycline (VIBRAMYCIN) 100 MG capsule Take 1 capsule (100 mg total) by mouth 2 (two) times daily for 7 days. 11/01/22 11/08/22 Yes Tayo Maute, [place] Folk, PA-C  guaiFENesin-codeine 100-10 MG/5ML syrup Take 5 mLs by mouth 3 (three) times daily as needed for cough. 11/01/22  Yes Annette Liotta, [place]rafton Folk, PA-C  albuterol (VENTOLIN HFA) 108 (90 Base) MCG/ACT inhaler Inhale 1-2 puffs into the lungs every 6 (six) hours as needed for wheezing or shortness of breath. 06/05/20   Hall-Potvin, [place], PA-C  amLODipine (NORVASC) 10 MG tablet Take 1 tablet (10 mg total) by mouth daily. 09/05/22    Dorna Mai, MD  amoxicillin-clavulanate (AUGMENTIN) 875-125 MG tablet Take 1 tablet by mouth 2 (two) times daily for 7 days. 10/30/22 11/06/22  Perlie Mayo, NP  benzonatate (TESSALON) 100 MG capsule Take 1-2 capsules (100-200 mg total) by mouth 3 (three) times daily as needed for cough. 10/30/22   Perlie Mayo, NP  fluticasone Asencion Islam) 50 MCG/ACT nasal spray Place 2 sprays into the nose daily as needed for allergies. 12/04/20   [provider]  hydrochlorothiazide (HYDRODIURIL) 25 MG tablet Take 1 tablet (25 mg total) by mouth daily. 09/05/22   Dorna Mai, MD  hydrOXYzine (ATARAX) 50 MG tablet Take 1 tablet (50 mg total) by mouth at bedtime. 03/30/22   Rai, Vernelle Emerald, MD  predniSONE (DELTASONE) 20 MG tablet Take 2 tablets (40 mg total) by mouth daily with breakfast for 5 days. 10/30/22 11/04/22  Perlie Mayo, NP  valsartan (DIOVAN) 320 MG tablet Take 1 tablet (320 mg total) by mouth daily. 09/05/22   Dorna Mai, MD  cetirizine (ZYRTEC ALLERGY) 10 MG tablet Take 1 tablet (10 mg total) by mouth daily. Patient not taking: Reported on 12/27/2019 01/19/19 12/27/19  Ok Edwards, PA-C  ipratropium (ATROVENT) 0.06 % nasal spray Place 2 sprays into both nostrils 4 (four) times daily. 12/27/19 06/05/20  Ok Edwards, PA-C      Allergies    Patient has no known allergies.    Review of Systems   Review of Systems  Constitutional:  Positive for chills and fever.  HENT:  Positive for congestion and rhinorrhea.   Respiratory:  Positive for cough.   All other systems reviewed and are negative.   Physical Exam Updated Vital Signs BP 127/83   Pulse 90   Temp 99.1 F (37.3 C) (Oral)   Resp 16   SpO2 91%  Physical Exam Vitals and nursing note reviewed.  Constitutional:      General: He is not in acute distress.    Appearance: He is well-developed. He is obese. He is not ill-appearing, toxic-appearing or diaphoretic.  HENT:     Head: Normocephalic and atraumatic.     Nose: Congestion and  rhinorrhea present.     Mouth/Throat:     Mouth: Mucous membranes are moist.     Pharynx: Oropharynx is clear. No oropharyngeal exudate or posterior oropharyngeal erythema.  Eyes:     Conjunctiva/sclera: Conjunctivae normal.     Pupils: Pupils are equal, round, and reactive to light.  Cardiovascular:     Rate and Rhythm: Normal rate and regular rhythm.     Heart sounds: No murmur heard. Pulmonary:     Effort: Pulmonary effort is normal. No respiratory distress.     Breath sounds: Normal breath sounds. No stridor. No wheezing, rhonchi or rales.  Abdominal:     Palpations: Abdomen is soft.     Tenderness: There is no abdominal tenderness. There is no guarding.     Hernia: No hernia is present.  Musculoskeletal:        General: No swelling.     Cervical back: Neck supple. No rigidity.     Right lower leg: No edema.     Left lower leg: No edema.  Lymphadenopathy:     Cervical: Cervical adenopathy (Anterior cervical lymphadenopathy) present.  Skin:    General: Skin is warm and dry.     Capillary Refill: Capillary refill takes less than 2 seconds.     Findings: No rash.  Neurological:     General: No focal deficit present.     Mental Status: He is alert and oriented to person, place, and time.  Psychiatric:        Mood and Affect: Mood normal.     ED Results / Procedures / Treatments   Labs (all labs ordered are listed, but only abnormal results are displayed) Labs Reviewed  RESP PANEL BY RT-PCR (RSV, FLU A&B, COVID)  RVPGX2 - Abnormal; Notable for the following components:      Result Value   Influenza B by PCR POSITIVE (*)    All other components within normal limits  BASIC METABOLIC PANEL - Abnormal; Notable for the following components:   Sodium 133 (*)    Chloride 94 (*)    Glucose, Bld 114 (*)    Creatinine, Ser 1.30 (*)    All other components within normal limits  CBC WITH DIFFERENTIAL/PLATELET - Abnormal; Notable for the following components:   RBC 7.02 (*)     HCT 54.2 (*)    MCV 77.2 (*)    MCH 23.4 (*)    RDW 18.9 (*)    All other components within normal limits    EKG None  Radiology DG Chest 2 View  Result Date: 11/01/2022 CLINICAL DATA:  Persistent cough, headaches EXAM: CHEST - 2 VIEW COMPARISON:  03/27/2022 FINDINGS: The heart size and mediastinal contours are within normal limits. Mild diffuse interstitial pulmonary opacity. The visualized skeletal structures are unremarkable. IMPRESSION: Mild diffuse interstitial pulmonary opacity,  consistent with edema or atypical/viral infection. No focal airspace opacity. Electronically Signed   By: Delanna Ahmadi M.D.   On: 11/01/2022 17:05   DG Abd 1 View  Result Date: 11/01/2022 CLINICAL DATA:  Abdominal pain EXAM: ABDOMEN - 1 VIEW COMPARISON:  None Available. FINDINGS: Nonobstructive bowel gas pattern. Visualized osseous structures are within normal limits. IMPRESSION: Negative. Electronically Signed   By: Julian Hy M.D.   On: 11/01/2022 17:05    Procedures Procedures    Medications Ordered in ED Medications  acetaminophen (TYLENOL) tablet 1,000 mg (1,000 mg Oral Given 11/01/22 1537)  albuterol (PROVENTIL) (2.5 MG/3ML) 0.083% nebulizer solution 2.5 mg (2.5 mg Nebulization Given 11/01/22 1651)  guaiFENesin-codeine 100-10 MG/5ML solution 5 mL (5 mLs Oral Given 11/01/22 1613)    ED Course/ Medical Decision Making/ A&P Clinical Course as of 11/01/22 1814  Sat Nov 01, 2022  1639 Patient is requesting imaging of his abdomen due to pain when coughing.  No abnormal findings on physical exam.  Will obtain KUB [AS]    Clinical Course User Index [AS] Alford Gamero, Grafton Folk, PA-C                             Medical Decision Making Amount and/or Complexity of Data Reviewed Labs: ordered. Radiology: ordered.  Risk OTC drugs. Prescription drug management.  This patient presents to the ED for concern of URI, cough, this involves an extensive number of treatment options, and is a complaint that  carries with it a high risk of complications and morbidity.  Differential diagnosis for emergent cause of cough includes but is not limited to upper respiratory infection, lower respiratory infection, allergies, asthma, irritants, foreign body, medications such as ACE inhibitors, reflux, asthma, CHF, lung cancer, interstitial lung disease, psychiatric causes, postnasal drip and postinfectious bronchospasm.   Co morbidities that complicate the patient evaluation   asthma, sleep apnea, hypertension  My initial workup includes basic labs, chest x-ray, albuterol, antitussive  Additional history obtained from: Nursing notes from this visit.  I ordered, reviewed and interpreted labs which include: CBC, BMP, respiratory panel.  Hyponatremia of 133.  Hypochloremia of 94.  Hyperglycemia 114.  Creatinine of 1.3.  Labs similar to  I ordered imaging studies including chest x-ray, x-ray abdomen I independently visualized and interpreted imaging which showed findings consistent with viral or atypical pneumonia I agree with the radiologist interpretation  Initially febrile to 102.1 which was reduced in the ED with Tylenol.  Hypoxia in the ED to 92% on room air.  States his oxygen at home is typically 94% on room air.  Chart review reveals his pulse ox in office the past 6 months have been 89 to 91%.  Patient was referred to pulmonology due to this abnormal finding in the absence of chronic smoking.  He denies shortness of breath today.  Did test positive for influenza B today.  Does not meet criteria for Tamiflu as he has had symptoms for nearly 1 month. does have congestion and rhinorrhea.  No adventitious breath sounds.  Patient will be treated for subacute bacterial sinusitis and pneumonia with doxycycline.  He was encouraged to follow-up with his primary care provider in 1 week for reevaluation of his symptoms.  Will also send prescription for codeine cough syrup.  He was educated on side effects of opioid  therapy.  He was given strict return precautions.  Stable at discharge.  At this time there does not appear to be  any evidence of an acute emergency medical condition and the patient appears stable for discharge with appropriate outpatient follow up. Diagnosis was discussed with patient who verbalizes understanding of care plan and is agreeable to discharge. I have discussed return precautions with patient who verbalizes understanding. Patient encouraged to follow-up with their PCP within 1 week. All questions answered.  Note: Portions of this report may have been transcribed using voice recognition software. Every effort was made to ensure accuracy; however, inadvertent computerized transcription errors may still be present.        Final Clinical Impression(s) / ED Diagnoses Final diagnoses:  Influenza B  Subacute sinusitis, unspecified location    Rx / DC Orders ED Discharge Orders          Ordered    doxycycline (VIBRAMYCIN) 100 MG capsule  2 times daily        11/01/22 1809    guaiFENesin-codeine 100-10 MG/5ML syrup  3 times daily PRN        11/01/22 1809              Nehemiah Massed 11/01/22 1814    Wyvonnia Dusky, MD 11/01/22 1836

## 2022-11-01 NOTE — Telephone Encounter (Signed)
[facility] does not have the codeine containing medication in stock or will not provide it.  We will send to the [facility] instead.

## 2022-11-01 NOTE — ED Triage Notes (Signed)
Headaches, congestion ,coughing for weeks. Started antibiotic Thursday and steroid , still no improvement.

## 2022-11-01 NOTE — Discharge Instructions (Addendum)
You have been seen today for your complaint of upper respiratory infection, cough. Your imaging showed findings consistent of viral pneumonia. Your discharge medications include doxycycline. This is an antibiotic. You should take it as prescribed. You should take it for the entire duration of the prescription. This may cause an upset stomach. This is normal. You may take this with food. You may also eat yogurt to prevent diarrhea.  Codeine cough syrup.  This is an opioid medication.  Only take this as needed.  Do not drive or operate heavy machinery while taking this medication. Follow up with: Pulmonology regarding your low oxygen readings.  This has been present for quite some time.  You should call Monday to schedule an appointment for an ED follow-up visit Please seek immediate medical care if you develop any of the following symptoms: Develop shortness of breath or have difficulty breathing. Have skin or nails that turn a bluish color. Have severe pain or stiffness in your neck. Develop a sudden headache or sudden pain in your face or ear. Cannot eat or drink without vomiting. At this time there does not appear to be the presence of an emergent medical condition, however there is always the potential for conditions to change. Please read and follow the below instructions.  Do not take your medicine if  develop an itchy rash, swelling in your mouth or lips, or difficulty breathing; call 911 and seek immediate emergency medical attention if this occurs.  You may review your lab tests and imaging results in their entirety on your MyChart account.  Please discuss all results of fully with your primary care provider and other specialist at your follow-up visit.  Note: Portions of this text may have been transcribed using voice recognition software. Every effort was made to ensure accuracy; however, inadvertent computerized transcription errors may still be present.

## 2022-11-26 ENCOUNTER — Ambulatory Visit (INDEPENDENT_AMBULATORY_CARE_PROVIDER_SITE_OTHER): Payer: Commercial Managed Care - HMO | Admitting: Neurology

## 2022-11-26 DIAGNOSIS — Z9889 Other specified postprocedural states: Secondary | ICD-10-CM

## 2022-11-26 DIAGNOSIS — G4733 Obstructive sleep apnea (adult) (pediatric): Secondary | ICD-10-CM | POA: Diagnosis not present

## 2022-11-26 DIAGNOSIS — R030 Elevated blood-pressure reading, without diagnosis of hypertension: Secondary | ICD-10-CM

## 2022-11-26 DIAGNOSIS — R0683 Snoring: Secondary | ICD-10-CM

## 2022-11-26 DIAGNOSIS — G4731 Primary central sleep apnea: Secondary | ICD-10-CM

## 2022-11-26 DIAGNOSIS — G4734 Idiopathic sleep related nonobstructive alveolar hypoventilation: Secondary | ICD-10-CM

## 2022-11-27 ENCOUNTER — Telehealth: Payer: Self-pay | Admitting: *Deleted

## 2022-11-27 NOTE — Procedures (Signed)
[facility]  HOME SLEEP TEST (Watch PAT) REPORT  STUDY DATE: 11/26/22  DOB: 10/21/1980  MRN: [id number]  ORDERING CLINICIAN: Star Age, MD, PhD   REFERRING CLINICIANDorna Mai, MD   CLINICAL INFORMATION/HISTORY: 42 year old right-handed gentleman with an underlying medical history of sinusitis, allergic rhinitis, asthma, and obesity, status post turbinate reduction, adenoidectomy and septoplasty in July 2023, who presents for re-evaluation of his sleep apnea. He was diagnosed with severe OSA in 2017. He had nasal surgery under Dr. Redmond Baseman on 03/26/2022 with turbinate reduction, submucosal resection, adenoidectomy and septoplasty.  Epworth sleepiness score: 10/24.  BMI: 44.7 kg/m  FINDINGS:   Sleep Summary:   Total Recording Time (hours, min): 10 hours, 5 min  Total Sleep Time (hours, min):  9 hours, 23 min  Percent REM (%):    7.4%   Respiratory Indices:   Calculated pAHI (per hour):  93.5/hour         REM pAHI:    90.6/hour       NREM pAHI: 93.7/hour  Central pAHI: 17.4/hour  Oxygen Saturation Statistics:    Oxygen Saturation (%) Mean: 77%   Minimum oxygen saturation (%):                 51%   O2 Saturation Range (%): 51 - 98%    O2 Saturation (minutes) <=88%: 341.7 min  Pulse Rate Statistics:   Pulse Mean (bpm):    72/min    Pulse Range (36 - 121/min)   IMPRESSION: OSA (obstructive sleep apnea), severe Central Sleep Apnea  Nocturnal Hypoxemia  RECOMMENDATION:  This home sleep test demonstrates rather severe obstructive sleep apnea with a total AHI of 93.5/hour and O2 nadir of 51% with nocturnal hypoxemia and average O2 saturation of only 77%. Moderate to loud snoring was detected. There was a milder central sleep apnea component as well. Urgent treatment with positive airway pressure is highly recommended. The patient will be advised to proceed with an autoPAP titration/trial at home. He will be advised to sleep mildly  elevated and on his sides while waiting for his PAP set up at home. A laboratory attended titration study can be considered in the future for optimization of treatment settings and to improve tolerance and compliance. Alternative treatment options are limited secondary to the severity of the patient's sleep disordered breathing, but may include surgical treatment with an implantable hypoglossal nerve stimulator (in carefully selected candidates, meeting criteria).  Concomitant weight loss is highly recommended as well. Please note, that untreated obstructive sleep apnea may carry additional perioperative morbidity. Patients with significant obstructive sleep apnea should receive perioperative PAP therapy and the surgeons and particularly the anesthesiologist should be informed of the diagnosis and the severity of the sleep disordered breathing. The patient should be cautioned not to drive, work at heights, or operate dangerous or heavy equipment when tired or sleepy. Review and reiteration of good sleep hygiene measures should be pursued with any patient. Other causes of the patient's symptoms, including circadian rhythm disturbances, an underlying mood disorder, medication effect and/or an underlying medical problem cannot be ruled out based on this test. Clinical correlation is recommended.  The patient and his referring provider will be notified of the test results. The patient will be seen in follow up in sleep clinic at [facility].  I certify that I have reviewed the raw data recording prior to the issuance of this report in accordance with the standards of the American Academy of Sleep Medicine (AASM).  INTERPRETING PHYSICIAN:  Star Age, MD, PhD Medical Director, [facility]) [place], ABPN (Neurology and Sleep)   [facility] [address] [place], [place] [address] [phone]

## 2022-11-27 NOTE — Addendum Note (Signed)
Addended by: Star Age on: 11/27/2022 05:19 PM   Modules accepted: Orders

## 2022-11-27 NOTE — Telephone Encounter (Signed)
-----   Message from Star Age, MD sent at 11/27/2022  5:19 PM EDT ----- Urgent set up requested on PAP therapy, due to severe OSA. Patient had HST for re-eval of his severe OSA, seen by me on 09/24/22, patient had a HST on 11/26/22.    Please call and notify the patient that the recent home sleep test showed obstructive sleep apnea in the very severe range. I recommend URGENT treatment for this in the form of autoPAP, which means, that we don't have to bring him in for a sleep study with CPAP, but will let him start using a so called autoPAP machine at home, through a DME company (of his choice, or as per insurance requirement). The DME representative will fit the patient with a mask of choice, educate him on how to use the machine, how to put the mask on, etc. I have placed an order in the chart. Please send the order to a local DME, talk to patient, send report to referring MD. Please also reinforce the need for compliance with treatment. We will need a FU in sleep clinic for 10 weeks post-PAP set up, please arrange that with me or one of our NPs.  Please advise patient to sleep with the HOB mildly elevated, about 30-45 degrees and on his sides as much as possible, and continue to work aggressively on wt loss.   Thanks,   Star Age, MD, PhD [facility])

## 2022-11-27 NOTE — Progress Notes (Signed)
See procedure note.

## 2022-11-27 NOTE — Telephone Encounter (Signed)
I spoke with the patient we discussed his sleep study results.  I advised him that his sleep study from last night showed obstructive sleep apnea in the very severe range.  Dr. Rexene Alberts recommends urgent treatment for this in the form of AutoPap.  The patient is amenable to proceeding with AutoPap and we discussed the difference between AutoPap and CPAP.  The patient is okay with using [facility] for his DME company.  I advised him that we would send a referral today for them to get him set up ASAP.  The patient was advised of the insurance compliance requirements which includes using the machine at least 4 hours at night and also being seen in our office for an initial follow-up appointment between 30 and 90 days after set up.  We went ahead and scheduled him for the follow-up appointment on June 3 at 1015 arrival 9:45 AM.  The patient verbalized understanding and his questions were answered.  He had some concerns about tolerating the mask but I assured him that [facility] would be able to do a mask fit with him to try to find one he could tolerate as there are different options.  We also discussed the additional recommendations from Dr. Rexene Alberts for the patient to sleep with his head of bed mildly elevated at about 30-45 degrees and to lie on his sides as much as possible and avoid his back.  Also continue to work aggressively on weight loss.  The patient verbalized understanding and appreciation for the call.  I faxed the urgent referral to [facility] and also reached out to our contact there to try to expedite setup. Received a receipt of confirmation on the fax.

## 2023-02-02 ENCOUNTER — Encounter: Payer: Self-pay | Admitting: Neurology

## 2023-02-02 ENCOUNTER — Ambulatory Visit: Payer: Commercial Managed Care - HMO | Admitting: Neurology

## 2023-02-02 ENCOUNTER — Encounter: Payer: Self-pay | Admitting: *Deleted

## 2023-02-02 VITALS — BP 157/99 | HR 61 | Ht 69.0 in | Wt 286.0 lb

## 2023-02-02 DIAGNOSIS — G4733 Obstructive sleep apnea (adult) (pediatric): Secondary | ICD-10-CM

## 2023-02-02 DIAGNOSIS — G4734 Idiopathic sleep related nonobstructive alveolar hypoventilation: Secondary | ICD-10-CM | POA: Diagnosis not present

## 2023-02-02 NOTE — Patient Instructions (Signed)
It was nice to see you again today. I am glad to hear, things are going well with your autoPAP therapy. You have adjusted well to treatment with your new machine, and you are compliant with it. You have also fulfilled the insurance-mandated compliance percentage, which is reassuring, so you can get ongoing supplies through your insurance. Please talk to your DME provider about getting replacement supplies on a regular basis. Please be sure to change your filter every month, your mask about every 3 months, hose about every 6 months, humidifier chamber about yearly. Some restrictions are imposed by your insurance carrier with regard to how frequently you can get certain supplies.  Your DME company can provide further details if necessary.   Please continue using your autoPAP regularly. While your insurance requires that you use PAP at least 4 hours each night on 70% of the nights, I recommend, that you not skip any nights and use it throughout the night if you can. Getting used to PAP and staying with the treatment long term does take time and patience and discipline. Untreated obstructive sleep apnea when it is moderate to severe can have an adverse impact on cardiovascular health and raise her risk for heart disease, arrhythmias, hypertension, congestive heart failure, stroke and diabetes. Untreated obstructive sleep apnea causes sleep disruption, nonrestorative sleep, and sleep deprivation. This can have an impact on your day to day functioning and cause daytime sleepiness and impairment of cognitive function, memory loss, mood disturbance, and problems focussing. Using PAP regularly can improve these symptoms. As discussed, we will do an overnight oxygen level test, called ONO, and your DME company will call and set this up for one night, while you also use your autoPAP as usual. We will call you with the results. This is to make sure that your oxygen levels stay in the 90s, while you are treated with autoPAP  for your OSA. Remember, your oxygen levels dropped into the 50s during the home sleep test.   If all goes well, we can see you in 1 year, you can see one of our nurse practitioners as you are stable.

## 2023-02-02 NOTE — Telephone Encounter (Signed)
[facility] confirmed receipt of the order.  

## 2023-02-02 NOTE — Progress Notes (Signed)
Subjective:    Patient ID: Luis Beard is a 42 y.o. male.  HPI    Interim history:   Luis Beard is a 42 year old right-handed gentleman with an underlying medical history of sinusitis, allergic rhinitis, asthma, and obesity, status post turbinate reduction, adenoidectomy and septoplasty in July 2023, who presents for follow-up consultation of his obstructive sleep apnea after interim testing and starting home AutoPap therapy.  The patient is unaccompanied today.  I last saw him on 09/24/2022, at which time he reported that his snoring and his breathing had improved after his nasal surgery.  He was agreeable to pursuing sleep testing.  He had a home sleep test on 11/26/2022 which showed rather severe obstructive sleep apnea with a total AHI of 93.5/hour and O2 nadir of 51% with nocturnal hypoxemia and average O2 saturation of only 77%. Moderate to loud snoring was detected. There was a milder central sleep apnea component as well.  He was advised to start home AutoPap therapy.  His set up date was 12/01/2022.  He has a ResMed air sense 10 AutoSet machine.  His DME company is [facility].     Today, 02/02/2023: I reviewed his AutoPap compliance data from 12/30/2022 through 01/28/2023, which is a total of 30 days, during which time he used his machine 27 days with percent use days greater than 4 hours at 70%, indicating adequate compliance with an average usage of 5 hours and 28 minutes, residual AHI at goal but borderline at 4.9/h, 95th percentile of pressure at 13.6 cm with a range of 7 to 16 cm with EPR of 1.  Leak on the higher side, with the 95th percentile at 26.4 L/min.  In the past 15 days he has been very consistent with his usage.  He reports doing better, he feels that the AutoPap is helping but he is still adjusting to treatment.  He finds it uncomfortable and sometimes he has fallen asleep without it or not put it back on after he went to the bathroom in the middle of the night.  His blood pressure  is elevated today but he reports that he has not taken his 3 blood pressure medications yet, he does not always have a set time for his blood pressure medications and tends to take them later in the afternoon.  He reports that his wife is very pleased with how he is sleeping, particularly since he barely snores any longer.  He is motivated to continue with treatment but would like to try a different mask.  He has been using nasal pillows.  He admits that he has been more consistent with his usage especially since his DME provider had contacted him regarding his compliance a few weeks ago.  He is motivated to continue with treatment, he is wondering about inspire and we talked about it again today.  He does have some nasal congestion and drainage and mucus buildup.  He believes his humidifier is at 7.   The patient's allergies, current medications, family history, past medical history, past social history, past surgical history and problem list were reviewed and updated as appropriate.    Previously:    I first met him at the request of his ENT surgeon on 02/05/2021, at which time the patient reported a prior diagnosis of obstructive sleep apnea.  He was diagnosed with severe OSA in 2017.  He was advised to proceed with reevaluation with a sleep study but did not pursue sleep testing at the time.  He presents after  a longer gap of over 18 months. He had interim Nasal surgery under Dr. Jenne Pane on 03/26/2022 with turbinate reduction, submucosal resection, adenoidectomy and septoplasty.      02/05/21: (He) was previously diagnosed with obstructive sleep apnea.  He had a split-night sleep study at [facility] on 04/09/2016 and I was able to review the report.  Baseline AHI was 35.9/h, he was titrated on CPAP to a pressure of 15 cm.  Study was interpreted by Dr. Jetty Duhamel.  I reviewed your office note from 12/04/2020.  He has inferior turbinate hypertrophy.  Conservative treatment and surgical treatment  were discussed.  He has not been on CPAP therapy.  His Epworth sleepiness score is 14 out of 24, fatigue severity score is 34 out of 63.  He is married and lives with his family including wife and 4 children, ages 6, 83, 56 and 35.  They do not have any pets in the household, he has a TV in his bedroom but it tends to stay on at night.  He goes to bed typically before midnight and rise time is around 7 AM.  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  He smokes cigars about 3/day, non-smoker of cigarettes, alcohol occasionally, maybe once a month and caffeine occasionally in the form of soda.  He has nocturia about 3-4 times per average night and denies recurrent morning headaches.  His sister has sleep apnea and has a CPAP machine.  He has had weight fluctuation, when he was placed on phentermine by his primary care physician, he was able to lose about 50 pounds but after he stopped the phentermine he gained most of the weight back.       His Past Medical History Is Significant For: Past Medical History:  Diagnosis Date   Asthma    Hypertension    Sleep apnea     His Past Surgical History Is Significant For: Past Surgical History:  Procedure Laterality Date   ADENOIDECTOMY N/A 03/26/2022   Procedure: ADENOIDECTOMY;  Surgeon: Christia Reading, MD;  Location: [facility];  Service: ENT;  Laterality: N/A;   NASAL TURBINATE REDUCTION Bilateral 03/26/2022   Procedure: TURBINATE REDUCTION/SUBMUCOSAL RESECTION;  Surgeon: Christia Reading, MD;  Location: [facility];  Service: ENT;  Laterality: Bilateral;   SEPTOPLASTY Bilateral 03/26/2022   Procedure: SEPTOPLASTY;  Surgeon: Christia Reading, MD;  Location: [facility];  Service: ENT;  Laterality: Bilateral;    His Family History Is Significant For: Family History  Problem Relation Age of Onset   Hypertension Mother    Diabetes Mother    Heart failure Sister    Obstructive Sleep Apnea Sister      His Social History Is Significant For: Social History   Socioeconomic History   Marital status: Married    Spouse name: Not on file   Number of children: Not on file   Years of education: Not on file   Highest education level: Not on file  Occupational History   Not on file  Tobacco Use   Smoking status: Former    Types: Cigars    Quit date: 2022    Years since quitting: 2.4   Smokeless tobacco: Never  Vaping Use   Vaping Use: Never used  Substance and Sexual Activity   Alcohol use: Yes    Comment: occasionally   Drug use: Yes    Frequency: 2.0 times per week    Types: Marijuana  Comment: last time 03-25-22 (yesterday)   Sexual activity: Yes    Birth control/protection: Condom  Other Topics Concern   Not on file  Social History Narrative   Caffiene soda 2-3 week.   Working daytime    Social Determinants of Corporate investment banker Strain: Not on file  Food Insecurity: Not on file  Transportation Needs: Not on file  Physical Activity: Not on file  Stress: Not on file  Social Connections: Not on file    His Allergies Are:  No Known Allergies:   His Current Medications Are:  Outpatient Encounter Medications as of 02/02/2023  Medication Sig   albuterol (VENTOLIN HFA) 108 (90 Base) MCG/ACT inhaler Inhale 1-2 puffs into the lungs every 6 (six) hours as needed for wheezing or shortness of breath.   amLODipine (NORVASC) 10 MG tablet Take 1 tablet (10 mg total) by mouth daily.   hydrochlorothiazide (HYDRODIURIL) 25 MG tablet Take 1 tablet (25 mg total) by mouth daily.   valsartan (DIOVAN) 320 MG tablet Take 1 tablet (320 mg total) by mouth daily.   benzonatate (TESSALON) 100 MG capsule Take 1-2 capsules (100-200 mg total) by mouth 3 (three) times daily as needed for cough.   fluticasone (FLONASE) 50 MCG/ACT nasal spray Place 2 sprays into the nose daily as needed for allergies.   guaiFENesin-codeine 100-10 MG/5ML syrup Take 5 mLs by mouth 3 (three) times daily  as needed for cough.   hydrOXYzine (ATARAX) 50 MG tablet Take 1 tablet (50 mg total) by mouth at bedtime.   [DISCONTINUED] cetirizine (ZYRTEC ALLERGY) 10 MG tablet Take 1 tablet (10 mg total) by mouth daily. (Patient not taking: Reported on 12/27/2019)   [DISCONTINUED] ipratropium (ATROVENT) 0.06 % nasal spray Place 2 sprays into both nostrils 4 (four) times daily.   No facility-administered encounter medications on file as of 02/02/2023.  :  Review of Systems:  Out of a complete 14 point review of systems, all are reviewed and negative with the exception of these symptoms as listed below:   Review of Systems  Neurological:        Pt here for CPAP f/u Pt states no questions or concerns for today's visit   ESS:5     Objective:  Neurological Exam  Physical Exam Physical Examination:   Vitals:   02/02/23 1003  BP: (!) 157/99  Pulse: 61    General Examination: The patient is a very pleasant 42 y.o. male in no acute distress. He appears well-developed and well-nourished and well groomed.   HEENT: Normocephalic, atraumatic, pupils are equal, round and reactive to light, extraocular tracking is well-preserved.  There is no obvious nystagmus.  Hearing is grossly intact.  Face is symmetric with normal facial animation. Speech is clear without dysarthria, hypophonia or voice tremor.  Neck with full range of motion, no carotid bruits.  Airway examination reveals mild mouth dryness, otherwise stable findings.  Tongue protrudes centrally and palate elevates symmetrically.     Chest: Clear to auscultation without wheezing, rhonchi or crackles noted.   Heart: S1+S2+0, regular and normal without murmurs, rubs or gallops noted.    Abdomen: Soft, non-tender and non-distended.   Extremities: There is no obvious edema in the distal lower extremities bilaterally.    Skin: Warm and dry without trophic changes noted.    Musculoskeletal: exam reveals no obvious joint deformities.     Neurologically:  Mental status: The patient is awake, alert and oriented in all 4 spheres. His immediate and remote memory, attention,  language skills and fund of knowledge are appropriate. There is no evidence of aphasia, agnosia, apraxia or anomia. Speech is clear with normal prosody and enunciation. Thought process is linear. Mood is normal and affect is normal.  Cranial nerves II - XII are as described above under HEENT exam.  Motor exam: Normal bulk, strength and tone is noted. There is no obvious resting or action tremor.  Fine motor skills and coordination: grossly intact.  Cerebellar testing: No dysmetria or intention tremor. There is no truncal or gait ataxia.  Sensory exam: intact to light touch in the upper and lower extremities.  Gait, station and balance: He stands easily. No veering to one side is noted. No leaning to one side is noted. Posture is age-appropriate and stance is narrow based. Gait shows normal stride length and normal pace. No problems turning are noted.    Assessment and Plan:    In summary, Luis Beard is a very pleasant 42 year old right-handed gentleman with an underlying medical history of sinusitis, allergic rhinitis, asthma, and obesity, status post turbinate reduction, adenoidectomy and septoplasty in July 2023, who presents for follow-up consultation of his obstructive sleep apnea after interim testing and starting home AutoPap therapy. He had a home sleep test on 11/26/2022 which showed rather severe obstructive sleep apnea with a total AHI of 93.5/hour and O2 nadir of 51% with nocturnal hypoxemia and average O2 saturation of only 77%. Moderate to loud snoring was detected. There was a milder central sleep apnea component as well.  He has been on home AutoPap therapy since 12/01/2022.  He has a ResMed air sense 10 AutoSet machine.  His DME company is [facility].  He has been compliant with treatment, lately he has been more consistent with his usage and is commended  for his treatment adherence.  Apnea scores are borderline but still below 5/h on average.  He has been using a nasal pillows interface but would like to try something different.  He is encouraged to get in touch with his DME provider.  He is advised to proceed with a home pulse oximetry test overnight to make sure his oxygen saturations are improved.  He had severe oxygen desaturations during his home sleep test and I advised him that he may need a pressure change or supplemental oxygen versus a in lab sleep study to ensure better oxygen saturations if there is an abnormality on his pulse ox.  We will set this up through his DME provider.  If all goes well and his oxygen saturations are much improved while on AutoPap therapy, we will plan a follow-up in sleep clinic in 1 year.  Regarding his mucus production and congestion, he is advised to start using a nasal saline rinse system and also down regulate his humidifier on his AutoPap, it may help with less drainage.  He is advised to continue to work on weight loss.  He smokes marijuana and does not smoke any cigarettes.  He is encouraged to be consistent with his AutoPap especially if he does smoke marijuana and falls asleep without his AutoPap, he may have severe desaturations as demonstrated during his home sleep test.  We reviewed his home sleep test results in detail and his compliance data as well.  We will plan to follow-up in 1 year, he can see one of our nurse practitioners, we may have to change our action plan depending on his pulse oximetry test results.  I answered all his questions today and he was in agreement. I  spent 40 minutes in total face-to-face time and in reviewing records during pre-charting, more than 50% of which was spent in counseling and coordination of care, reviewing test results, reviewing medications and treatment regimen and/or in discussing or reviewing the diagnosis of severe OSA, the prognosis and treatment options. Pertinent  laboratory and imaging test results that were available during this visit with the patient were reviewed by me and considered in my medical decision making (see chart for details).

## 2023-02-02 NOTE — Progress Notes (Signed)
I sent [facility] the order for ONO x 1 night while on autopap.

## 2023-02-23 NOTE — Telephone Encounter (Signed)
I called [facility] to get update on ONO. I LVM with office number, pt's name and DOB, and asked for call back with update.

## 2023-02-25 NOTE — Telephone Encounter (Signed)
I called [facility] again to check update on ONO. I was told the patient actually just returned the ONO equipment this AM so as soon as they have the results they will fax them to [place].

## 2023-03-10 ENCOUNTER — Ambulatory Visit (INDEPENDENT_AMBULATORY_CARE_PROVIDER_SITE_OTHER): Payer: Commercial Managed Care - HMO | Admitting: Family Medicine

## 2023-03-10 ENCOUNTER — Encounter: Payer: Self-pay | Admitting: Family Medicine

## 2023-03-10 VITALS — BP 162/102 | HR 82 | Temp 98.1°F | Resp 16 | Wt 285.0 lb

## 2023-03-10 DIAGNOSIS — I10 Essential (primary) hypertension: Secondary | ICD-10-CM | POA: Diagnosis not present

## 2023-03-10 DIAGNOSIS — J302 Other seasonal allergic rhinitis: Secondary | ICD-10-CM | POA: Diagnosis not present

## 2023-03-10 MED ORDER — SPIRONOLACTONE 50 MG PO TABS: 50.0000 mg | ORAL_TABLET | Freq: Every day | ORAL | 1 refills | Status: DC

## 2023-03-10 MED ORDER — CETIRIZINE HCL 10 MG PO TABS: 10.0000 mg | ORAL_TABLET | Freq: Every day | ORAL | 11 refills | Status: DC

## 2023-03-11 ENCOUNTER — Encounter: Payer: Self-pay | Admitting: Family Medicine

## 2023-03-11 NOTE — Progress Notes (Signed)
Established Patient Office Visit  Subjective    Patient ID: Luis Beard, male    DOB: [birth year]  Age: 42 y.o. MRN: [id number]  CC:  Chief Complaint  Patient presents with   Hypertension    HPI Luis Beard presents for follow up of hypertension. Patient denies acute complaints.    Outpatient Encounter Medications as of 03/10/2023  Medication Sig   cetirizine (ZYRTEC) 10 MG tablet Take 1 tablet (10 mg total) by mouth daily.   spironolactone (ALDACTONE) 50 MG tablet Take 1 tablet (50 mg total) by mouth daily.   amLODipine (NORVASC) 10 MG tablet Take 1 tablet (10 mg total) by mouth daily.   fluticasone (FLONASE) 50 MCG/ACT nasal spray Place 2 sprays into the nose daily as needed for allergies.   hydrochlorothiazide (HYDRODIURIL) 25 MG tablet Take 1 tablet (25 mg total) by mouth daily.   valsartan (DIOVAN) 320 MG tablet Take 1 tablet (320 mg total) by mouth daily.   [DISCONTINUED] albuterol (VENTOLIN HFA) 108 (90 Base) MCG/ACT inhaler Inhale 1-2 puffs into the lungs every 6 (six) hours as needed for wheezing or shortness of breath.   [DISCONTINUED] benzonatate (TESSALON) 100 MG capsule Take 1-2 capsules (100-200 mg total) by mouth 3 (three) times daily as needed for cough.   [DISCONTINUED] cetirizine (ZYRTEC ALLERGY) 10 MG tablet Take 1 tablet (10 mg total) by mouth daily. (Patient not taking: Reported on 12/27/2019)   [DISCONTINUED] guaiFENesin-codeine 100-10 MG/5ML syrup Take 5 mLs by mouth 3 (three) times daily as needed for cough.   [DISCONTINUED] hydrOXYzine (ATARAX) 50 MG tablet Take 1 tablet (50 mg total) by mouth at bedtime.   [DISCONTINUED] ipratropium (ATROVENT) 0.06 % nasal spray Place 2 sprays into both nostrils 4 (four) times daily.   No facility-administered encounter medications on file as of 03/10/2023.    Past Medical History:  Diagnosis Date   Asthma    Hypertension    Sleep apnea     Past Surgical History:  Procedure Laterality Date   ADENOIDECTOMY N/A  03/26/2022   Procedure: ADENOIDECTOMY;  Surgeon: Christia Reading, MD;  Location: [facility];  Service: ENT;  Laterality: N/A;   NASAL TURBINATE REDUCTION Bilateral 03/26/2022   Procedure: TURBINATE REDUCTION/SUBMUCOSAL RESECTION;  Surgeon: Christia Reading, MD;  Location: [facility];  Service: ENT;  Laterality: Bilateral;   SEPTOPLASTY Bilateral 03/26/2022   Procedure: SEPTOPLASTY;  Surgeon: Christia Reading, MD;  Location: [facility];  Service: ENT;  Laterality: Bilateral;    Family History  Problem Relation Age of Onset   Hypertension Mother    Diabetes Mother    Heart failure Sister    Obstructive Sleep Apnea Sister     Social History   Socioeconomic History   Marital status: Married    Spouse name: Not on file   Number of children: Not on file   Years of education: Not on file   Highest education level: GED or equivalent  Occupational History   Not on file  Tobacco Use   Smoking status: Former    Types: Cigars    Quit date: 2022    Years since quitting: 2.5   Smokeless tobacco: Never  Vaping Use   Vaping Use: Never used  Substance and Sexual Activity   Alcohol use: Yes    Comment: occasionally   Drug use: Yes    Frequency: 2.0 times per week    Types: Marijuana    Comment: last time 03-25-22 (yesterday)   Sexual activity: Yes  Birth control/protection: Condom  Other Topics Concern   Not on file  Social History Narrative   Caffiene soda 2-3 week.   Working daytime    Social Determinants of Health   Financial Resource Strain: Low Risk  (03/08/2023)   Overall Financial Resource Strain (CARDIA)    Difficulty of Paying Living Expenses: Not very hard  Food Insecurity: No Food Insecurity (03/08/2023)   Hunger Vital Sign    Worried About Running Out of Food in the Last Year: Never true    Ran Out of Food in the Last Year: Never true  Transportation Needs: No Transportation Needs (03/08/2023)   PRAPARE - Scientist, research (physical sciences) (Medical): No    Lack of Transportation (Non-Medical): No  Physical Activity: Insufficiently Active (03/08/2023)   Exercise Vital Sign    Days of Exercise per Week: 1 day    Minutes of Exercise per Session: 10 min  Stress: No Stress Concern Present (03/08/2023)   [employer] of Occupational Health - Occupational Stress Questionnaire    Feeling of Stress : Not at all  Social Connections: Moderately Isolated (03/08/2023)   Social Connection and Isolation Panel [NHANES]    Frequency of Communication with Friends and Family: More than three times a week    Frequency of Social Gatherings with Friends and Family: Once a week    Attends Religious Services: Never    Database administrator or Organizations: No    Attends Engineer, structural: Not on file    Marital Status: Married  Catering manager Violence: Not on file    Review of Systems  All other systems reviewed and are negative.       Objective    BP (!) 162/102   Pulse 82   Temp 98.1 F (36.7 C) (Oral)   Resp 16   Wt 285 lb (129.3 kg)   SpO2 91%   BMI 42.09 kg/m   Physical Exam Vitals and nursing note reviewed.  Constitutional:      General: He is not in acute distress.    Appearance: He is obese.  Cardiovascular:     Rate and Rhythm: Normal rate and regular rhythm.  Pulmonary:     Effort: Pulmonary effort is normal.     Breath sounds: Normal breath sounds.  Abdominal:     Palpations: Abdomen is soft.     Tenderness: There is no abdominal tenderness.  Musculoskeletal:     Right lower leg: No edema.     Left lower leg: No edema.  Neurological:     General: No focal deficit present.     Mental Status: He is alert and oriented to person, place, and time.         Assessment & Plan:   1. Uncontrolled hypertension Elevated readings. Will add spironolactone 50 mg daily to the regimen and refer to cardiology for further eval/mgt. - Ambulatory referral to Cardiology  2. Seasonal  allergic rhinitis, unspecified trigger Zyrtec prescribed   Return in about 4 weeks (around 04/07/2023) for follow up.   [name], MD

## 2023-03-12 ENCOUNTER — Telehealth: Payer: Self-pay | Admitting: Neurology

## 2023-03-12 DIAGNOSIS — G4733 Obstructive sleep apnea (adult) (pediatric): Secondary | ICD-10-CM

## 2023-03-12 DIAGNOSIS — G4734 Idiopathic sleep related nonobstructive alveolar hypoventilation: Secondary | ICD-10-CM

## 2023-03-12 NOTE — Telephone Encounter (Signed)
I received patient's pulse oximetry test results.  He was on his AutoPap machine at the time, study date 02/24/2023, starting at 10:28 PM and end date 02/25/2023 at 4:45 AM.  Duration of test time 6 hours and 17 minutes, he was not on his AutoPap throughout the time, he ended using his AutoPap about 3:45 AM.  He also had an hour where the pulse ox had come off.  Altogether, his average oxygen saturation was 98.4%, nadir was 80%, time below or at 88% saturation was 50 minutes.  Please advise patient that his oxygen saturations are not good enough while on AutoPap therapy.  I would like to see about bringing him in for a formal titration study.  Please reinforce full compliance with his current AutoPap machine and advised patient that we will reach out to him once we have insurance approval for an actual sleep study with CPAP titration overnight.

## 2023-03-16 NOTE — Telephone Encounter (Signed)
I spoke with the patient and provided him with the results of the pulse oximetry test. I informed him about compliance with his current AutoPap machine. He was advised that he will be contact for the formal sleep study. He verbalized understanding of the findings and expressed appreciation for the call.

## 2023-03-23 ENCOUNTER — Telehealth: Payer: Self-pay | Admitting: Neurology

## 2023-03-23 DIAGNOSIS — G4733 Obstructive sleep apnea (adult) (pediatric): Secondary | ICD-10-CM

## 2023-03-23 NOTE — Telephone Encounter (Signed)
Spoke to patient Pt states too much air  pressure not able to sleep during the night . Pt states very stopped up Made pt aware will forward  message to Dr. Frances Furbish

## 2023-03-23 NOTE — Telephone Encounter (Signed)
Pt stated he is having issues with CPAP pressure. Pt requesting a call back from nurse.

## 2023-03-23 NOTE — Telephone Encounter (Signed)
Will change Pressure setting to min pressure 6 cm and max 14 cm for better tolerance. Please encourage ongoing compliance, he has used his autoPAP well thus far with good apnea control.

## 2023-03-24 NOTE — Telephone Encounter (Addendum)
Spoke to patient made him aware of pressure change  . Sent orders to [facility] this am Pt was very appreciative and thanked me for calling

## 2023-04-01 ENCOUNTER — Other Ambulatory Visit: Payer: Self-pay | Admitting: Family Medicine

## 2023-04-02 NOTE — Telephone Encounter (Signed)
CPAP- [employer] pending faxed notes

## 2023-04-08 NOTE — Telephone Encounter (Signed)
Called to check the status. It is still pending it is in MD review.

## 2023-04-09 NOTE — Telephone Encounter (Signed)
I do not understand this, he is already on AutoPap therapy but has had ongoing desaturations which was seen on the abnormal ONO recently.  In-lab titration is requested to optimize his treatment settings and improve his hypoxemia.  Meagan, can you look into this?

## 2023-04-09 NOTE — Telephone Encounter (Signed)
[employer] denied the CPAP please see below for the reason. It looks like his insurance want him to try the APAP therapy.

## 2023-04-15 ENCOUNTER — Ambulatory Visit: Payer: Commercial Managed Care - HMO | Admitting: Family Medicine

## 2023-04-15 ENCOUNTER — Encounter: Payer: Self-pay | Admitting: Family Medicine

## 2023-04-15 VITALS — BP 113/76 | HR 84 | Temp 98.1°F | Resp 16 | Wt 272.0 lb

## 2023-04-15 DIAGNOSIS — Z6841 Body Mass Index (BMI) 40.0 and over, adult: Secondary | ICD-10-CM | POA: Diagnosis not present

## 2023-04-15 DIAGNOSIS — I10 Essential (primary) hypertension: Secondary | ICD-10-CM | POA: Diagnosis not present

## 2023-04-15 NOTE — Progress Notes (Signed)
Established Patient Office Visit  Subjective    Patient ID: Luis Beard, male    DOB: 1981-04-07  Age: 42 y.o. MRN: [id number]  CC: No chief complaint on file.   HPI Luis Beard presents for follow up of hypertension Patient denies acute complaints.    Outpatient Encounter Medications as of 04/15/2023  Medication Sig   albuterol (VENTOLIN HFA) 108 (90 Base) MCG/ACT inhaler Inhale into the lungs.   amLODipine (NORVASC) 10 MG tablet Take 1 tablet (10 mg total) by mouth daily.   cetirizine (ZYRTEC) 10 MG tablet Take 1 tablet (10 mg total) by mouth daily.   chlorpheniramine-HYDROcodone (TUSSIONEX) 10-8 MG/5ML SMARTSIG:5 Milliliter(s) By Mouth Daily PRN   hydrochlorothiazide (HYDRODIURIL) 25 MG tablet Take 1 tablet (25 mg total) by mouth daily.   spironolactone (ALDACTONE) 50 MG tablet TAKE 1 TABLET BY MOUTH EVERY DAY   valsartan (DIOVAN) 320 MG tablet Take 1 tablet (320 mg total) by mouth daily.   [DISCONTINUED] fluticasone (FLONASE) 50 MCG/ACT nasal spray Place 2 sprays into the nose daily as needed for allergies.   [DISCONTINUED] ipratropium (ATROVENT) 0.06 % nasal spray Place 2 sprays into both nostrils 4 (four) times daily.   No facility-administered encounter medications on file as of 04/15/2023.    Past Medical History:  Diagnosis Date   Asthma    Hypertension    Sleep apnea     Past Surgical History:  Procedure Laterality Date   ADENOIDECTOMY N/A 03/26/2022   Procedure: ADENOIDECTOMY;  Surgeon: Christia Reading, MD;  Location: [facility];  Service: ENT;  Laterality: N/A;   NASAL TURBINATE REDUCTION Bilateral 03/26/2022   Procedure: TURBINATE REDUCTION/SUBMUCOSAL RESECTION;  Surgeon: Christia Reading, MD;  Location: [facility];  Service: ENT;  Laterality: Bilateral;   SEPTOPLASTY Bilateral 03/26/2022   Procedure: SEPTOPLASTY;  Surgeon: Christia Reading, MD;  Location: [facility];  Service: ENT;  Laterality: Bilateral;    Family  History  Problem Relation Age of Onset   Hypertension Mother    Diabetes Mother    Heart failure Sister    Obstructive Sleep Apnea Sister     Social History   Socioeconomic History   Marital status: Married    Spouse name: Not on file   Number of children: Not on file   Years of education: Not on file   Highest education level: GED or equivalent  Occupational History   Not on file  Tobacco Use   Smoking status: Former    Types: Cigars    Quit date: 2022    Years since quitting: 2.6   Smokeless tobacco: Never  Vaping Use   Vaping status: Never Used  Substance and Sexual Activity   Alcohol use: Yes    Comment: occasionally   Drug use: Yes    Frequency: 2.0 times per week    Types: Marijuana    Comment: last time 03-25-22 (yesterday)   Sexual activity: Yes    Birth control/protection: Condom  Other Topics Concern   Not on file  Social History Narrative   Caffiene soda 2-3 week.   Working daytime    Social Determinants of Health   Financial Resource Strain: Low Risk  (03/08/2023)   Overall Financial Resource Strain (CARDIA)    Difficulty of Paying Living Expenses: Not very hard  Food Insecurity: No Food Insecurity (03/08/2023)   Hunger Vital Sign    Worried About Running Out of Food in the Last Year: Never true    Ran Out of Food  in the Last Year: Never true  Transportation Needs: No Transportation Needs (03/08/2023)   PRAPARE - Administrator, Civil Service (Medical): No    Lack of Transportation (Non-Medical): No  Physical Activity: Insufficiently Active (03/08/2023)   Exercise Vital Sign    Days of Exercise per Week: 1 day    Minutes of Exercise per Session: 10 min  Stress: No Stress Concern Present (03/08/2023)   [employer] of Occupational Health - Occupational Stress Questionnaire    Feeling of Stress : Not at all  Social Connections: Moderately Isolated (03/08/2023)   Social Connection and Isolation Panel [NHANES]    Frequency of Communication  with Friends and Family: More than three times a week    Frequency of Social Gatherings with Friends and Family: Once a week    Attends Religious Services: Never    Database administrator or Organizations: No    Attends Engineer, structural: Not on file    Marital Status: Married  Catering manager Violence: Not on file    Review of Systems  All other systems reviewed and are negative.       Objective    BP 113/76   Pulse 84   Temp 98.1 F (36.7 C) (Oral)   Resp 16   Wt 272 lb (123.4 kg)   SpO2 91%   BMI 40.17 kg/m   Physical Exam Vitals and nursing note reviewed.  Constitutional:      General: He is not in acute distress.    Appearance: He is obese.  Cardiovascular:     Rate and Rhythm: Normal rate and regular rhythm.  Pulmonary:     Effort: Pulmonary effort is normal.     Breath sounds: Normal breath sounds.  Abdominal:     Palpations: Abdomen is soft.     Tenderness: There is no abdominal tenderness.  Musculoskeletal:     Right lower leg: No edema.     Left lower leg: No edema.  Neurological:     General: No focal deficit present.     Mental Status: He is alert and oriented to person, place, and time.         Assessment & Plan:   1. Essential hypertension Improved and Appears stable. Continue   2. Class 3 severe obesity due to excess calories with serious comorbidity and body mass index (BMI) of 45.0 to 49.9 in adult [facility])    Return in about 3 months (around 07/16/2023) for follow up, chronic med issues.   [name], MD

## 2023-04-15 NOTE — Progress Notes (Signed)
Patient is here for BP recheck. Patient said he has been taking his medication like she should .

## 2023-04-17 ENCOUNTER — Encounter: Payer: Self-pay | Admitting: Family Medicine

## 2023-04-28 ENCOUNTER — Ambulatory Visit: Payer: Commercial Managed Care - HMO | Attending: Cardiovascular Disease | Admitting: Cardiovascular Disease

## 2023-04-29 ENCOUNTER — Encounter: Payer: Self-pay | Admitting: Cardiovascular Disease

## 2023-05-07 ENCOUNTER — Ambulatory Visit: Payer: Commercial Managed Care - HMO | Admitting: Internal Medicine

## 2023-05-15 ENCOUNTER — Telehealth: Payer: Self-pay | Admitting: Family Medicine

## 2023-05-15 NOTE — Telephone Encounter (Signed)
Called patient to reschedule appointment due to provider being out the office on the original day that was scheduled

## 2023-07-15 ENCOUNTER — Encounter: Payer: Self-pay | Admitting: Family Medicine

## 2023-07-15 ENCOUNTER — Ambulatory Visit: Payer: Managed Care, Other (non HMO) | Admitting: Family Medicine

## 2023-07-15 VITALS — BP 138/80 | HR 63 | Temp 98.0°F | Resp 16 | Ht 68.0 in | Wt 269.4 lb

## 2023-07-15 DIAGNOSIS — Z6841 Body Mass Index (BMI) 40.0 and over, adult: Secondary | ICD-10-CM | POA: Diagnosis not present

## 2023-07-15 DIAGNOSIS — I10 Essential (primary) hypertension: Secondary | ICD-10-CM | POA: Diagnosis not present

## 2023-07-15 DIAGNOSIS — E66813 Obesity, class 3: Secondary | ICD-10-CM | POA: Diagnosis not present

## 2023-07-16 ENCOUNTER — Ambulatory Visit: Payer: Commercial Managed Care - HMO | Admitting: Family Medicine

## 2023-07-21 ENCOUNTER — Encounter: Payer: Self-pay | Admitting: Family Medicine

## 2023-07-21 NOTE — Progress Notes (Signed)
Established Patient Office Visit  Subjective    Patient ID: Luis Beard, male    DOB: July 01, 1981  Age: 42 y.o. MRN: [id number]  CC:  Chief Complaint  Patient presents with   Follow-up    HPI Luis Beard presents for follow up of hypertension. Denies acute complaints or concerns.   Outpatient Encounter Medications as of 07/15/2023  Medication Sig   albuterol (VENTOLIN HFA) 108 (90 Base) MCG/ACT inhaler Inhale into the lungs.   amLODipine (NORVASC) 10 MG tablet Take 1 tablet (10 mg total) by mouth daily.   cetirizine (ZYRTEC) 10 MG tablet Take 1 tablet (10 mg total) by mouth daily.   chlorpheniramine-HYDROcodone (TUSSIONEX) 10-8 MG/5ML SMARTSIG:5 Milliliter(s) By Mouth Daily PRN   hydrochlorothiazide (HYDRODIURIL) 25 MG tablet Take 1 tablet (25 mg total) by mouth daily.   spironolactone (ALDACTONE) 50 MG tablet TAKE 1 TABLET BY MOUTH EVERY DAY   valsartan (DIOVAN) 320 MG tablet Take 1 tablet (320 mg total) by mouth daily.   [DISCONTINUED] ipratropium (ATROVENT) 0.06 % nasal spray Place 2 sprays into both nostrils 4 (four) times daily.   No facility-administered encounter medications on file as of 07/15/2023.    Past Medical History:  Diagnosis Date   Asthma    Hypertension    Sleep apnea     Past Surgical History:  Procedure Laterality Date   ADENOIDECTOMY N/A 03/26/2022   Procedure: ADENOIDECTOMY;  Surgeon: Christia Reading, MD;  Location: [facility];  Service: ENT;  Laterality: N/A;   NASAL TURBINATE REDUCTION Bilateral 03/26/2022   Procedure: TURBINATE REDUCTION/SUBMUCOSAL RESECTION;  Surgeon: Christia Reading, MD;  Location: [facility];  Service: ENT;  Laterality: Bilateral;   SEPTOPLASTY Bilateral 03/26/2022   Procedure: SEPTOPLASTY;  Surgeon: Christia Reading, MD;  Location: [facility];  Service: ENT;  Laterality: Bilateral;    Family History  Problem Relation Age of Onset   Hypertension Mother    Diabetes Mother     Heart failure Sister    Obstructive Sleep Apnea Sister     Social History   Socioeconomic History   Marital status: Married    Spouse name: Not on file   Number of children: Not on file   Years of education: Not on file   Highest education level: GED or equivalent  Occupational History   Not on file  Tobacco Use   Smoking status: Former    Types: Cigars    Quit date: 2022    Years since quitting: 2.8   Smokeless tobacco: Never  Vaping Use   Vaping status: Never Used  Substance and Sexual Activity   Alcohol use: Yes    Comment: occasionally   Drug use: Yes    Frequency: 2.0 times per week    Types: Marijuana    Comment: last time 03-25-22 (yesterday)   Sexual activity: Yes    Birth control/protection: Condom  Other Topics Concern   Not on file  Social History Narrative   Caffiene soda 2-3 week.   Working daytime    Social Determinants of Health   Financial Resource Strain: Low Risk  (03/08/2023)   Overall Financial Resource Strain (CARDIA)    Difficulty of Paying Living Expenses: Not very hard  Food Insecurity: No Food Insecurity (03/08/2023)   Hunger Vital Sign    Worried About Running Out of Food in the Last Year: Never true    Ran Out of Food in the Last Year: Never true  Transportation Needs: No Transportation Needs (03/08/2023)  PRAPARE - Administrator, Civil Service (Medical): No    Lack of Transportation (Non-Medical): No  Physical Activity: Insufficiently Active (03/08/2023)   Exercise Vital Sign    Days of Exercise per Week: 1 day    Minutes of Exercise per Session: 10 min  Stress: No Stress Concern Present (03/08/2023)   [employer] of Occupational Health - Occupational Stress Questionnaire    Feeling of Stress : Not at all  Social Connections: Moderately Isolated (03/08/2023)   Social Connection and Isolation Panel [NHANES]    Frequency of Communication with Friends and Family: More than three times a week    Frequency of Social Gatherings  with Friends and Family: Once a week    Attends Religious Services: Never    Database administrator or Organizations: No    Attends Engineer, structural: Not on file    Marital Status: Married  Catering manager Violence: Not on file    Review of Systems  All other systems reviewed and are negative.       Objective    BP 138/80 (BP Location: Right Arm, Patient Position: Sitting, Cuff Size: Large)   Pulse 63   Temp 98 F (36.7 C) (Oral)   Resp 16   Ht 5\' 8"  (1.727 m)   Wt 269 lb 6.4 oz (122.2 kg)   SpO2 93%   BMI 40.96 kg/m   Physical Exam Vitals and nursing note reviewed.  Constitutional:      General: He is not in acute distress. Cardiovascular:     Rate and Rhythm: Normal rate and regular rhythm.  Pulmonary:     Effort: Pulmonary effort is normal.     Breath sounds: Normal breath sounds.  Abdominal:     Palpations: Abdomen is soft.     Tenderness: There is no abdominal tenderness.  Neurological:     General: No focal deficit present.     Mental Status: He is alert and oriented to person, place, and time.         Assessment & Plan:   1. Essential hypertension Much improved with present management. Continue   2. Class 3 severe obesity due to excess calories with serious comorbidity and body mass index (BMI) of 45.0 to 49.9 in adult [facility])      Return in about 4 months (around 11/12/2023) for physical.   [name], MD

## 2023-08-13 ENCOUNTER — Other Ambulatory Visit: Payer: Self-pay | Admitting: Family Medicine

## 2023-08-13 NOTE — Telephone Encounter (Signed)
Changed to 90 day supply per request of pharmacy.  Requested Prescriptions  Pending Prescriptions Disp Refills   amLODipine (NORVASC) 10 MG tablet [Pharmacy Med Name: AMLODIPINE BESYLATE 10 MG TAB] 90 tablet 0    Sig: TAKE 1 TABLET BY MOUTH EVERY DAY     Cardiovascular: Calcium Channel Blockers 2 Passed - 08/13/2023 12:21 PM      Passed - Last BP in normal range    BP Readings from Last 1 Encounters:  07/15/23 138/80         Passed - Last Heart Rate in normal range    Pulse Readings from Last 1 Encounters:  07/15/23 63         Passed - Valid encounter within last 6 months    Recent Outpatient Visits           4 weeks ago Essential hypertension   [place] [facility], MD   4 months ago Essential hypertension   [place] [facility] MD   5 months ago Uncontrolled hypertension   [place] [facility], MD   11 months ago Essential hypertension   [place] [facility], MD   1 year ago Encounter for weight management   [place] [facility], MD       Future Appointments             In 3 months Georganna Skeans, MD [facility]

## 2023-09-17 ENCOUNTER — Other Ambulatory Visit: Payer: Self-pay | Admitting: Family Medicine

## 2023-09-17 NOTE — Telephone Encounter (Signed)
Requested Prescriptions  Pending Prescriptions Disp Refills   valsartan (DIOVAN) 320 MG tablet [Pharmacy Med Name: VALSARTAN 320 MG TABLET] 90 tablet 0    Sig: TAKE 1 TABLET BY MOUTH EVERY DAY     Cardiovascular:  Angiotensin Receptor Blockers Failed - 09/17/2023 10:10 AM      Failed - Cr in normal range and within 180 days    Creat  Date Value Ref Range Status  09/25/2021 1.19 0.60 - 1.26 mg/dL Final   Creatinine, Ser  Date Value Ref Range Status  11/01/2022 1.30 (H) 0.61 - 1.24 mg/dL Final         Failed - K in normal range and within 180 days    Potassium  Date Value Ref Range Status  11/01/2022 3.5 3.5 - 5.1 mmol/L Final         Passed - Patient is not pregnant      Passed - Last BP in normal range    BP Readings from Last 1 Encounters:  07/15/23 138/80         Passed - Valid encounter within last 6 months    Recent Outpatient Visits           2 months ago Essential hypertension   [place] [facility], MD   5 months ago Essential hypertension   [place] [facility], MD   6 months ago Uncontrolled hypertension   [place] [facility], MD   1 year ago Essential hypertension   [place] [facility], MD   1 year ago Encounter for weight management   [place] [facility], MD       Future Appointments             In 1 month Georganna Skeans, MD [facility] at [facility]

## 2023-11-12 ENCOUNTER — Encounter: Payer: Self-pay | Admitting: Family Medicine

## 2023-11-12 ENCOUNTER — Ambulatory Visit (INDEPENDENT_AMBULATORY_CARE_PROVIDER_SITE_OTHER): Payer: Managed Care, Other (non HMO) | Admitting: Family Medicine

## 2023-11-12 VITALS — BP 125/87 | HR 62 | Temp 97.8°F | Resp 16 | Ht 69.0 in | Wt 265.4 lb

## 2023-11-12 DIAGNOSIS — Z13220 Encounter for screening for lipoid disorders: Secondary | ICD-10-CM

## 2023-11-12 DIAGNOSIS — Z0000 Encounter for general adult medical examination without abnormal findings: Secondary | ICD-10-CM | POA: Diagnosis not present

## 2023-11-12 DIAGNOSIS — Z130 Encounter for screening for diseases of the blood and blood-forming organs and certain disorders involving the immune mechanism: Secondary | ICD-10-CM

## 2023-11-12 MED ORDER — VALSARTAN 320 MG PO TABS: 320.0000 mg | ORAL_TABLET | Freq: Every day | ORAL | 1 refills | Status: DC

## 2023-11-12 MED ORDER — AMLODIPINE BESYLATE 10 MG PO TABS: 10.0000 mg | ORAL_TABLET | Freq: Every day | ORAL | 1 refills | Status: DC

## 2023-11-12 MED ORDER — HYDROCHLOROTHIAZIDE 25 MG PO TABS: 25.0000 mg | ORAL_TABLET | Freq: Every day | ORAL | 1 refills | Status: AC

## 2023-11-12 MED ORDER — SPIRONOLACTONE 50 MG PO TABS: 50.0000 mg | ORAL_TABLET | Freq: Every day | ORAL | 1 refills | Status: DC

## 2023-11-12 NOTE — Progress Notes (Signed)
 Established Patient Office Visit  Subjective    Patient ID: Luis Beard, male    DOB: [birth year]  Age: 43 y.o. MRN: [id number]  CC:  Chief Complaint  Patient presents with   Follow-up    4 month    HPI Luis Beard presents for routine annual exam. Patient denies acute complaints.   Outpatient Encounter Medications as of 11/12/2023  Medication Sig   albuterol (VENTOLIN HFA) 108 (90 Base) MCG/ACT inhaler Inhale into the lungs.   cetirizine (ZYRTEC) 10 MG tablet Take 1 tablet (10 mg total) by mouth daily.   chlorpheniramine-HYDROcodone (TUSSIONEX) 10-8 MG/5ML SMARTSIG:5 Milliliter(s) By Mouth Daily PRN   [DISCONTINUED] amLODipine (NORVASC) 10 MG tablet TAKE 1 TABLET BY MOUTH EVERY DAY   [DISCONTINUED] hydrochlorothiazide (HYDRODIURIL) 25 MG tablet Take 1 tablet (25 mg total) by mouth daily.   [DISCONTINUED] spironolactone (ALDACTONE) 50 MG tablet TAKE 1 TABLET BY MOUTH EVERY DAY   [DISCONTINUED] valsartan (DIOVAN) 320 MG tablet TAKE 1 TABLET BY MOUTH EVERY DAY   amLODipine (NORVASC) 10 MG tablet Take 1 tablet (10 mg total) by mouth daily.   hydrochlorothiazide (HYDRODIURIL) 25 MG tablet Take 1 tablet (25 mg total) by mouth daily.   spironolactone (ALDACTONE) 50 MG tablet Take 1 tablet (50 mg total) by mouth daily.   valsartan (DIOVAN) 320 MG tablet Take 1 tablet (320 mg total) by mouth daily.   [DISCONTINUED] ipratropium (ATROVENT) 0.06 % nasal spray Place 2 sprays into both nostrils 4 (four) times daily.   No facility-administered encounter medications on file as of 11/12/2023.    Past Medical History:  Diagnosis Date   Asthma    Hypertension    Sleep apnea     Past Surgical History:  Procedure Laterality Date   ADENOIDECTOMY N/A 03/26/2022   Procedure: ADENOIDECTOMY;  Surgeon: Christia Reading, MD;  Location: [facility];  Service: ENT;  Laterality: N/A;   NASAL TURBINATE REDUCTION Bilateral 03/26/2022   Procedure: TURBINATE REDUCTION/SUBMUCOSAL  RESECTION;  Surgeon: Christia Reading, MD;  Location: [facility];  Service: ENT;  Laterality: Bilateral;   SEPTOPLASTY Bilateral 03/26/2022   Procedure: SEPTOPLASTY;  Surgeon: Christia Reading, MD;  Location: [facility];  Service: ENT;  Laterality: Bilateral;    Family History  Problem Relation Age of Onset   Hypertension Mother    Diabetes Mother    Heart failure Sister    Obstructive Sleep Apnea Sister     Social History   Socioeconomic History   Marital status: Married    Spouse name: Not on file   Number of children: Not on file   Years of education: Not on file   Highest education level: GED or equivalent  Occupational History   Not on file  Tobacco Use   Smoking status: Former    Types: Cigars    Quit date: 2022    Years since quitting: 3.1   Smokeless tobacco: Never  Vaping Use   Vaping status: Never Used  Substance and Sexual Activity   Alcohol use: Yes    Comment: occasionally   Drug use: Yes    Frequency: 2.0 times per week    Types: Marijuana    Comment: last time 03-25-22 (yesterday)   Sexual activity: Yes    Birth control/protection: Condom  Other Topics Concern   Not on file  Social History Narrative   Caffiene soda 2-3 week.   Working daytime    Social Drivers of Corporate investment banker Strain: Low Risk  (  03/08/2023)   Overall Financial Resource Strain (CARDIA)    Difficulty of Paying Living Expenses: Not very hard  Food Insecurity: No Food Insecurity (03/08/2023)   Hunger Vital Sign    Worried About Running Out of Food in the Last Year: Never true    Ran Out of Food in the Last Year: Never true  Transportation Needs: No Transportation Needs (03/08/2023)   PRAPARE - Administrator, Civil Service (Medical): No    Lack of Transportation (Non-Medical): No  Physical Activity: Insufficiently Active (03/08/2023)   Exercise Vital Sign    Days of Exercise per Week: 1 day    Minutes of Exercise per Session: 10 min   Stress: No Stress Concern Present (03/08/2023)   [employer] of Occupational Health - Occupational Stress Questionnaire    Feeling of Stress : Not at all  Social Connections: Moderately Isolated (03/08/2023)   Social Connection and Isolation Panel [NHANES]    Frequency of Communication with Friends and Family: More than three times a week    Frequency of Social Gatherings with Friends and Family: Once a week    Attends Religious Services: Never    Database administrator or Organizations: No    Attends Engineer, structural: Not on file    Marital Status: Married  Catering manager Violence: Not on file    Review of Systems  All other systems reviewed and are negative.       Objective    BP 125/87   Pulse 62   Temp 97.8 F (36.6 C) (Oral)   Resp 16   Ht 5\' 9"  (1.753 m)   Wt 265 lb 6.4 oz (120.4 kg)   SpO2 92%   BMI 39.19 kg/m   Physical Exam Vitals and nursing note reviewed.  Constitutional:      General: He is not in acute distress. HENT:     Head: Normocephalic and atraumatic.     Right Ear: Tympanic membrane, ear canal and external ear normal.     Left Ear: Tympanic membrane, ear canal and external ear normal.     Nose: Nose normal.     Mouth/Throat:     Mouth: Mucous membranes are moist.     Pharynx: Oropharynx is clear.  Eyes:     Conjunctiva/sclera: Conjunctivae normal.     Pupils: Pupils are equal, round, and reactive to light.  Neck:     Thyroid: No thyromegaly.  Cardiovascular:     Rate and Rhythm: Normal rate and regular rhythm.     Heart sounds: Normal heart sounds. No murmur heard. Pulmonary:     Effort: Pulmonary effort is normal.     Breath sounds: Normal breath sounds.  Abdominal:     General: There is no distension.     Palpations: Abdomen is soft. There is no mass.     Tenderness: There is no abdominal tenderness.     Hernia: There is no hernia in the left inguinal area or right inguinal area.  Musculoskeletal:        General:  Normal range of motion.     Cervical back: Normal range of motion and neck supple.     Right lower leg: No edema.     Left lower leg: No edema.  Skin:    General: Skin is warm and dry.  Neurological:     General: No focal deficit present.     Mental Status: He is alert and oriented to person, place, and time. Mental  status is at baseline.  Psychiatric:        Mood and Affect: Mood normal.        Behavior: Behavior normal.         Assessment & Plan:   Annual physical exam -     CMP14+EGFR  Screening for deficiency anemia -     CBC with Differential/Platelet  Screening for lipid disorders -     Lipid panel  Other orders -     amLODIPine Besylate; Take 1 tablet (10 mg total) by mouth daily.  Dispense: 90 tablet; Refill: 1 -     Spironolactone; Take 1 tablet (50 mg total) by mouth daily.  Dispense: 90 tablet; Refill: 1 -     Valsartan; Take 1 tablet (320 mg total) by mouth daily.  Dispense: 90 tablet; Refill: 1 -     hydroCHLOROthiazide; Take 1 tablet (25 mg total) by mouth daily.  Dispense: 90 tablet; Refill: 1     Return in about 6 months (around 05/14/2024) for follow up, chronic med issues.   [name], MD

## 2023-11-13 LAB — CMP14+EGFR
ALT: 18 IU/L (ref 0–44)
AST: 17 IU/L (ref 0–40)
Albumin: 4.8 g/dL (ref 4.1–5.1)
Alkaline Phosphatase: 130 IU/L — ABNORMAL HIGH (ref 44–121)
BUN/Creatinine Ratio: 10 (ref 9–20)
BUN: 13 mg/dL (ref 6–24)
Bilirubin Total: 0.9 mg/dL (ref 0.0–1.2)
CO2: 23 mmol/L (ref 20–29)
Calcium: 9.9 mg/dL (ref 8.7–10.2)
Chloride: 97 mmol/L (ref 96–106)
Creatinine, Ser: 1.27 mg/dL (ref 0.76–1.27)
Globulin, Total: 2.9 g/dL (ref 1.5–4.5)
Glucose: 81 mg/dL (ref 70–99)
Potassium: 4.3 mmol/L (ref 3.5–5.2)
Sodium: 136 mmol/L (ref 134–144)
Total Protein: 7.7 g/dL (ref 6.0–8.5)
eGFR: 73 mL/min/{1.73_m2} (ref >59)

## 2023-11-13 LAB — CBC WITH DIFFERENTIAL/PLATELET
Basophils Absolute: 0.1 10*3/uL (ref 0.0–0.2)
Basos: 1 %
EOS (ABSOLUTE): 0.9 10*3/uL — ABNORMAL HIGH (ref 0.0–0.4)
Eos: 11 %
Hematocrit: 51.8 % — ABNORMAL HIGH (ref 37.5–51.0)
Hemoglobin: 16.9 g/dL (ref 13.0–17.7)
Immature Grans (Abs): 0 10*3/uL (ref 0.0–0.1)
Immature Granulocytes: 0 %
Lymphocytes Absolute: 3 10*3/uL (ref 0.7–3.1)
Lymphs: 34 %
MCH: 27.6 pg (ref 26.6–33.0)
MCHC: 32.6 g/dL (ref 31.5–35.7)
MCV: 85 fL (ref 79–97)
Monocytes Absolute: 0.7 10*3/uL (ref 0.1–0.9)
Monocytes: 8 %
Neutrophils Absolute: 4 10*3/uL (ref 1.4–7.0)
Neutrophils: 46 %
Platelets: 297 10*3/uL (ref 150–450)
RBC: 6.13 x10E6/uL — ABNORMAL HIGH (ref 4.14–5.80)
RDW: 13.3 % (ref 11.6–15.4)
WBC: 8.6 10*3/uL (ref 3.4–10.8)

## 2023-11-13 LAB — LIPID PANEL
Chol/HDL Ratio: 6.2 ratio — ABNORMAL HIGH (ref 0.0–5.0)
Cholesterol, Total: 261 mg/dL — ABNORMAL HIGH (ref 100–199)
HDL: 42 mg/dL (ref >39)
LDL Chol Calc (NIH): 194 mg/dL — ABNORMAL HIGH (ref 0–99)
Triglycerides: 138 mg/dL (ref 0–149)
VLDL Cholesterol Cal: 25 mg/dL (ref 5–40)

## 2024-02-05 NOTE — Progress Notes (Signed)
 [facility] [address] [place]. [place] [address] [phone]       OFFICE FOLLOW UP NOTE  Mr. Luis Beard Date of Birth:  02/28/1981 Medical Record Number:  [id number]    Primary neurologist: Dr. Omar Bibber Reason for visit: CPAP follow-up    SUBJECTIVE:   CHIEF COMPLAINT:  Chief Complaint  Patient presents with   Sleep Apnea    Rm 3 alone Pt is well, reports he does not use CPAP every night. He reports he gets congested when using mask. He also reports he lost weight and didn't feel he needed to use it nightly.     Follow-up visit:  Prior visit: 02/02/2023 Dr. Omar Bibber  Brief HPI:   Luis Beard is a 43 y.o. male who is followed for OSA on CPAP.  HST 10/2022 showed severe obstructive sleep apnea with a total AHI of 93.5/hour and O2 nadir of 51% with nocturnal hypoxemia and average O2 saturation of only 77%.  AutoPap therapy initiated 12/2022.  At prior visit with Dr. Omar Bibber, compliance report showed adequate compliance with optimal residual AHI.  Reported feeling better on AutoPap therapy but still adjusting to treatment finding uncomfortable at times.  Using nasal pillows but question different mask type and advised to follow-up with DME.  Also recommended completion of ONO.   Patient called in July requesting change in pressure settings due to feeling of too much pressure and increased congestion, settings changed from 7-16 to 6-14    Interval history:  Patient returns for yearly CPAP compliance visit.  He continues to struggle with nightly usage due to increased congestion if he uses for more than just a few days in a row despite use of saline rinses and nasal spray (fluticasone ).  Denies any improvement since pressure setting changes.  He believes possibly related to sinus surgery in 03/2022 (turbinate reduction, submucosal resection, adenoidectomyand septoplasty). Continues to use nasal pillow, tried nasal mask but no improvement, unable to tolerate  FFM due to claustrophobia feeling.  He does note benefit when he uses CPAP more so in regards to sleep quality.  Denies any significant daytime fatigue or continued snoring with or without CPAP over the past several months, feels possibly related to weight loss, has lost about 30 pounds since initial CPAP study. ONO showed continued oxygen desaturations despite CPAP therefore recommended pursuing titration study but was denied by insurance for unclear reason.  ESS 6/24.  DME [facility] and up to date on supplies.       ROS:   14 system review of systems performed and negative with exception of those listed in HPI  PMH:  Past Medical History:  Diagnosis Date   Asthma    Hypertension    Sleep apnea     PSH:  Past Surgical History:  Procedure Laterality Date   ADENOIDECTOMY N/A 03/26/2022   Procedure: ADENOIDECTOMY;  Surgeon: Virgina Grills, MD;  Location: [facility];  Service: ENT;  Laterality: N/A;   NASAL TURBINATE REDUCTION Bilateral 03/26/2022   Procedure: TURBINATE REDUCTION/SUBMUCOSAL RESECTION;  Surgeon: Virgina Grills, MD;  Location: [facility];  Service: ENT;  Laterality: Bilateral;   SEPTOPLASTY Bilateral 03/26/2022   Procedure: SEPTOPLASTY;  Surgeon: Virgina Grills, MD;  Location: [facility];  Service: ENT;  Laterality: Bilateral;    Social History:  Social History   Socioeconomic History   Marital status: Married    Spouse name: Not on file   Number of children: Not on file   Years of  education: Not on file   Highest education level: GED or equivalent  Occupational History   Not on file  Tobacco Use   Smoking status: Former    Types: Cigars    Quit date: 2022    Years since quitting: 3.4   Smokeless tobacco: Never  Vaping Use   Vaping status: Never Used  Substance and Sexual Activity   Alcohol use: Yes    Comment: occasionally   Drug use: Yes    Frequency: 2.0 times per week    Types: Marijuana    Comment: last time  03-25-22 (yesterday)   Sexual activity: Yes    Birth control/protection: Condom  Other Topics Concern   Not on file  Social History Narrative   Caffiene soda 2-3 week.   Working daytime    Social Drivers of Corporate investment banker Strain: Low Risk  (03/08/2023)   Overall Financial Resource Strain (CARDIA)    Difficulty of Paying Living Expenses: Not very hard  Food Insecurity: No Food Insecurity (03/08/2023)   Hunger Vital Sign    Worried About Running Out of Food in the Last Year: Never true    Ran Out of Food in the Last Year: Never true  Transportation Needs: No Transportation Needs (03/08/2023)   PRAPARE - Administrator, Civil Service (Medical): No    Lack of Transportation (Non-Medical): No  Physical Activity: Insufficiently Active (03/08/2023)   Exercise Vital Sign    Days of Exercise per Week: 1 day    Minutes of Exercise per Session: 10 min  Stress: No Stress Concern Present (03/08/2023)   [employer] of Occupational Health - Occupational Stress Questionnaire    Feeling of Stress : Not at all  Social Connections: Moderately Isolated (03/08/2023)   Social Connection and Isolation Panel [NHANES]    Frequency of Communication with Friends and Family: More than three times a week    Frequency of Social Gatherings with Friends and Family: Once a week    Attends Religious Services: Never    Database administrator or Organizations: No    Attends Engineer, structural: Not on file    Marital Status: Married  Catering manager Violence: Not on file    Family History:  Family History  Problem Relation Age of Onset   Hypertension Mother    Diabetes Mother    Heart failure Sister    Obstructive Sleep Apnea Sister     Medications:   Current Outpatient Medications on File Prior to Visit  Medication Sig Dispense Refill   albuterol  (VENTOLIN  HFA) 108 (90 Base) MCG/ACT inhaler Inhale into the lungs.     amLODipine  (NORVASC ) 10 MG tablet Take 1 tablet (10 mg  total) by mouth daily. 90 tablet 1   cetirizine  (ZYRTEC ) 10 MG tablet Take 1 tablet (10 mg total) by mouth daily. 30 tablet 11   chlorpheniramine-HYDROcodone  (TUSSIONEX) 10-8 MG/5ML SMARTSIG:5 Milliliter(s) By Mouth Daily PRN     hydrochlorothiazide  (HYDRODIURIL ) 25 MG tablet Take 1 tablet (25 mg total) by mouth daily. 90 tablet 1   spironolactone  (ALDACTONE ) 50 MG tablet Take 1 tablet (50 mg total) by mouth daily. 90 tablet 1   valsartan  (DIOVAN ) 320 MG tablet Take 1 tablet (320 mg total) by mouth daily. 90 tablet 1   [DISCONTINUED] ipratropium (ATROVENT ) 0.06 % nasal spray Place 2 sprays into both nostrils 4 (four) times daily. 15 mL 0   No current facility-administered medications on file prior to visit.  Allergies:  No Known Allergies    OBJECTIVE:  Physical Exam  Vitals:   02/08/24 0831 02/08/24 0833  BP: (!) 156/107 (!) 152/98  Pulse: 78 89  Weight: 271 lb (122.9 kg)   Height: 5\' 9"  (1.753 m)    Body mass index is 40.02 kg/m. (BMI 44.54 09/2022) No results found.   General: well developed, well nourished, very pleasant middle-age African-American male, seated, in no evident distress Head: head normocephalic and atraumatic.   Neck: supple with no carotid or supraclavicular bruits Cardiovascular: regular rate and rhythm, no murmurs Musculoskeletal: no deformity Skin:  no rash/petichiae Vascular:  Normal pulses all extremities   Neurologic Exam Mental Status: Awake and fully alert. Oriented to place and time. Recent and remote memory intact. Attention span, concentration and fund of knowledge appropriate. Mood and affect appropriate.  Cranial Nerves: Pupils equal, briskly reactive to light. Extraocular movements full without nystagmus. Visual fields full to confrontation. Hearing intact. Facial sensation intact. Face, tongue, palate moves normally and symmetrically.  Motor: Normal bulk and tone. Normal strength in all tested extremity muscles Gait and Station: Arises  from chair without difficulty. Stance is normal. Gait demonstrates normal stride length and balance without use of AD.         ASSESSMENT/PLAN: Luis Beard is a 43 y.o. year old male    OSA on CPAP :  Nocturnal hypoxemia:  Compliance report shows suboptimal usage due to difficulty tolerating with increased congestion after using for several days in a row despite use of saline rinses, nasal spray and adjusting humidification settings.  Unable to tolerate FFM. Will f/u with Dr. Omar Bibber to discuss other recommendations.  ONO 03/2023 average oxygen saturation was 98.4%, nadir was 80%, time below or at 88% saturation was 50 minutes.  Dr. Omar Bibber recommended titration study but denied by insurance - will f/u with sleep lab regarding this. He also questions repeat sleep study due to 30 lb weight loss (10% weight reduction since initial sleep study) although suspect apnea still present based on severity of apnea on HST and BMI still at 40 (previously 44). Will discuss this as well with Dr. Omar Bibber. He was congratulated on his current weight loss accomplishment and encouraged further weight loss Continue current pressure settings 6-14 with EPR 2.   Discussed importance of nightly usage with ensuring greater than 4 hours nightly for optimal benefit and per insurance purposes.   Continue to follow with DME company for any needed supplies or CPAP related concerns     Follow up in 6 months with Dr. Omar Bibber or call earlier if needed   CC:  PCP: Abraham Abo, MD    I personally spent a total of 40 minutes in the care of the patient today including preparing to see the patient, performing a medically appropriate exam/evaluation, counseling and educating, placing orders, referring and communicating with other health care professionals, and independently interpreting results. This is our first time meeting and time has been spent reviewing past medical history and relevant medical records.  Johny Nap,  AGNP-BC  [facility] [address] [place], [place] [address]  Phone [phone] Fax [phone] Note: This document was prepared with digital dictation and possible smart phrase technology. Any transcriptional errors that result from this process are unintentional.

## 2024-02-08 ENCOUNTER — Encounter: Payer: Self-pay | Admitting: Adult Health

## 2024-02-08 ENCOUNTER — Ambulatory Visit: Payer: Commercial Managed Care - HMO | Admitting: Adult Health

## 2024-02-08 VITALS — BP 152/98 | HR 89 | Ht 69.0 in | Wt 271.0 lb

## 2024-02-08 DIAGNOSIS — G4734 Idiopathic sleep related nonobstructive alveolar hypoventilation: Secondary | ICD-10-CM

## 2024-02-08 DIAGNOSIS — Z9889 Other specified postprocedural states: Secondary | ICD-10-CM | POA: Diagnosis not present

## 2024-02-08 DIAGNOSIS — G4733 Obstructive sleep apnea (adult) (pediatric): Secondary | ICD-10-CM | POA: Diagnosis not present

## 2024-02-08 NOTE — Patient Instructions (Signed)
 Your Plan:  Please ensure you are using your CPAP nightly with greater than 4 hours per night  Will follow-up with Dr. Omar Bibber regarding continued issues with congestion after using for several days in a row and further recommendations  Will also follow-up with our sleep lab and Dr. Omar Bibber regarding titration study versus repeat sleep study   Congratulations on your current weight loss achievement!  Highly encouraged continued weight loss for overall health benefits     Follow up in 6 months with Dr. Omar Bibber or call earlier if needed     Thank you for coming to see us  at [facility]. I hope we have been able to provide you high quality care today.  You may receive a patient satisfaction survey over the next few weeks. We would appreciate your feedback and comments so that we may continue to improve ourselves and the health of our patients.

## 2024-02-08 NOTE — Addendum Note (Signed)
 Addended by: Johny Nap L on: 02/08/2024 09:12 AM   Modules accepted: Level of Service

## 2024-02-14 NOTE — Progress Notes (Signed)
 I reviewed the above note and documentation by the Nurse Practitioner and agree with the history, exam, assessment and plan as outlined above. I was available for consultation. Since a titration study was denied, I recommend we reevaluate his sleep apnea with a home sleep test given his weight loss.  Based on the results we may be able to make some suggestions regarding pressure or alternative treatment options. Debbra Fairy, MD, PhD [facility])

## 2024-02-15 NOTE — Addendum Note (Signed)
 Addended by: Johny Nap L on: 02/15/2024 07:31 AM   Modules accepted: Orders

## 2024-02-16 ENCOUNTER — Telehealth: Payer: Self-pay

## 2024-02-16 NOTE — Telephone Encounter (Signed)
 Spoke w/Pt regarding recommendations for HST since titration study was denied by insurance. Pt is in agreement. Informed Pt [facility] will reach out to him with details about HST. Pt voiced understanding.

## 2024-03-01 ENCOUNTER — Ambulatory Visit (INDEPENDENT_AMBULATORY_CARE_PROVIDER_SITE_OTHER): Admitting: Neurology

## 2024-03-01 DIAGNOSIS — G4733 Obstructive sleep apnea (adult) (pediatric): Secondary | ICD-10-CM | POA: Diagnosis not present

## 2024-03-01 DIAGNOSIS — Z9889 Other specified postprocedural states: Secondary | ICD-10-CM

## 2024-03-01 DIAGNOSIS — G4734 Idiopathic sleep related nonobstructive alveolar hypoventilation: Secondary | ICD-10-CM

## 2024-03-01 DIAGNOSIS — G4731 Primary central sleep apnea: Secondary | ICD-10-CM

## 2024-03-02 NOTE — Procedures (Signed)
 [facility]  HOME SLEEP TEST (Watch PAT) REPORT  STUDY DATE: 03/02/2024  DOB: Jul 12, 1981  MRN: [id number]  ORDERING CLINICIAN: True Mar, MD, PhD   REFERRING CLINICIAN: Harlene Bogaert, NP  CLINICAL INFORMATION/HISTORY: 43 year old male with an underlying medical history of asthma, hypertension, sleep apnea, and severe obesity with a BMI of over 40, status post nasal septum surgery and turbinate reduction in July 2023, who presents for reevaluation of his OSA.  He has not been fully consistent with his AutoPap of 6 to 14 cm with EPR of 2.  He has a prior diagnosis of severe obstructive sleep apnea from March 2024, his HST at the time showed an AHI of above 90/h, O2 nadir 51% with significant nocturnal hypoxemia noted at the time.  The patient reports weight loss.  BMI: 40 kg/m  FINDINGS:   Sleep Summary:   Total Recording Time (hours, min): 7 hours, 42 min  Total Sleep Time (hours, min):  6 hours, 47 min  Percent REM (%):    20.5%   Respiratory Indices:   Calculated pAHI (per hour):  54.4/hour         REM pAHI:    48.6/hour       NREM pAHI: 56/hour  Central pAHI: 5.1/hour  Oxygen Saturation Statistics:    Oxygen Saturation (%) Mean: 87%   Minimum oxygen saturation (%):                 71%   O2 Saturation Range (%): 71-98%    O2 Saturation (minutes) <=88%: 247.8 min  Pulse Rate Statistics:   Pulse Mean (bpm):    70/min    Pulse Range (32-116/min)   IMPRESSION:   OSA (obstructive sleep apnea), severe  Central Sleep Apnea  Nocturnal Hypoxemia  RECOMMENDATION:  This home sleep test demonstrates severe obstructive sleep apnea with a total AHI of 54.4/hour and O2 nadir of 71% with significant time below or at 88% saturation of over 245 minutes for the study, indicating nocturnal hypoxemia.  In fact, his average oxygen saturation was only 87%.  Snoring was in the moderate to loud range.  Altogether, while comparing his test results to his  home sleep test from March 2024, his sleep apnea has improved some, and while it is no longer considered critically severe, it is still in the very severe range and ongoing treatment with positive airway pressure such as AutoPap is highly recommended.  There is a very mild or borderline central apnea component as well.  If he has trouble tolerating his AutoPap, a designated CPAP titration study can be helpful for optimization of treatment settings and to improve tolerance and compliance, if needed, down the road. Alternative treatment options are limited secondary to the severity of the patient's sleep disordered breathing, but may include surgical treatment with an implantable hypoglossal nerve stimulator (in carefully selected candidates, meeting criteria).  Concomitant weight loss is recommended (where clinically appropriate). Please note, that untreated obstructive sleep apnea may carry additional perioperative morbidity. Patients with significant obstructive sleep apnea should receive perioperative PAP therapy and the surgeons and particularly the anesthesiologist should be informed of the diagnosis and the severity of the sleep disordered breathing. The patient should be cautioned not to drive, work at heights, or operate dangerous or heavy equipment when tired or sleepy. Review and reiteration of good sleep hygiene measures should be pursued with any patient. Other causes of the patient's symptoms, including circadian rhythm disturbances, an underlying mood disorder, medication effect and/or an underlying  medical problem cannot be ruled out based on this test. Clinical correlation is recommended.  The patient and his referring provider will be notified of the test results. The patient will be seen in follow up in sleep clinic at [facility].  I certify that I have reviewed the raw data recording prior to the issuance of this report in accordance with the standards of the American Academy of Sleep Medicine  (AASM).    INTERPRETING PHYSICIAN:   True Mar, MD, PhD Medical Director, [facility]) Diplomat, ABPN (Neurology and Sleep)   [facility] [address] [place], [place] [address] [phone]

## 2024-03-02 NOTE — Progress Notes (Signed)
 See procedure note.

## 2024-03-04 ENCOUNTER — Ambulatory Visit
Admission: EM | Admit: 2024-03-04 | Discharge: 2024-03-04 | Disposition: A | Discharge status: Home / Self Care | Attending: Family Medicine | Admitting: Family Medicine

## 2024-03-04 DIAGNOSIS — W57XXXA Bitten or stung by nonvenomous insect and other nonvenomous arthropods, initial encounter: Secondary | ICD-10-CM

## 2024-03-04 DIAGNOSIS — L03311 Cellulitis of abdominal wall: Secondary | ICD-10-CM

## 2024-03-04 MED ORDER — PREDNISONE 20 MG PO TABS: 40.0000 mg | ORAL_TABLET | Freq: Every day | ORAL | 0 refills | Status: AC

## 2024-03-04 MED ORDER — CEPHALEXIN 500 MG PO CAPS: 500.0000 mg | ORAL_CAPSULE | Freq: Two times a day (BID) | ORAL | 0 refills | Status: AC

## 2024-03-04 NOTE — Discharge Instructions (Signed)
 Take prednisone  20 mg--2 daily for 5 days  Cephalexin  500 mg --1 tablet by mouth 2 times daily for 7 days.  Take Zyrtec /cetirizine  10 mg over-the-counter--1 tablet daily as needed for itching

## 2024-03-04 NOTE — ED Provider Notes (Signed)
 [facility]    CSN: [id number] Arrival date & time: 03/04/24  0806      History   Chief Complaint Chief Complaint  Patient presents with   Insect Bite    HPI Geroge Beard is a 43 y.o. male.   HPI Here for swollen red itchy spot on his right lower abdomen.  About 2 days ago he noticed an insect bite him on his right lower abdomen.  Since then it has been itching a lot and is actually painful. No trouble breathing and no lip swelling.  NKDA  He takes medication for hypertension but does not have a history of diabetes.   Past Medical History:  Diagnosis Date   Asthma    Hypertension    Sleep apnea     Patient Active Problem List   Diagnosis Date Noted   Referred otalgia of right ear 05/07/2022   Temporomandibular joint (TMJ) pain 05/07/2022   Acute hypoxemic respiratory failure (HCC) 03/26/2022   Acute recurrent pansinusitis 12/18/2015   Nasal turbinate hypertrophy 12/18/2015   Obstructive sleep apnea 12/18/2015   Seasonal allergic rhinitis 12/18/2015    Past Surgical History:  Procedure Laterality Date   ADENOIDECTOMY N/A 03/26/2022   Procedure: ADENOIDECTOMY;  Surgeon: Carlie Clark, MD;  Location: [facility];  Service: ENT;  Laterality: N/A;   NASAL TURBINATE REDUCTION Bilateral 03/26/2022   Procedure: TURBINATE REDUCTION/SUBMUCOSAL RESECTION;  Surgeon: Carlie Clark, MD;  Location: [facility];  Service: ENT;  Laterality: Bilateral;   SEPTOPLASTY Bilateral 03/26/2022   Procedure: SEPTOPLASTY;  Surgeon: Carlie Clark, MD;  Location:  SUR[facility]ERY [facility];  Service: ENT;  Laterality: Bilateral;       Home Medications    Prior to Admission medications   Medication Sig Start Date End Date Taking? Authorizing Provider  amLODipine  (NORVASC ) 10 MG tablet Take 1 tablet (10 mg total) by mouth daily. 11/12/23  Yes Tanda Bleacher, MD  cephALEXin  (KEFLEX ) 500 MG capsule Take 1 capsule (500 mg total) by mouth 2 (two)  times daily for 5 days. 03/04/24 03/09/24 Yes Vonna Sharlet POUR, MD  hydrochlorothiazide  (HYDRODIURIL ) 25 MG tablet Take 1 tablet (25 mg total) by mouth daily. 11/12/23  Yes Tanda Bleacher, MD  predniSONE  (DELTASONE ) 20 MG tablet Take 2 tablets (40 mg total) by mouth daily with breakfast for 5 days. 03/04/24 03/09/24 Yes Vonna Sharlet POUR, MD  spironolactone  (ALDACTONE ) 50 MG tablet Take 1 tablet (50 mg total) by mouth daily. 11/12/23  Yes Tanda Bleacher, MD  valsartan  (DIOVAN ) 320 MG tablet Take 1 tablet (320 mg total) by mouth daily. 11/12/23  Yes Tanda Bleacher, MD  albuterol  (VENTOLIN  HFA) 108 [phone] Base) MCG/ACT inhaler Inhale into the lungs. 09/06/14   [provider]  chlorpheniramine-HYDROcodone  (TUSSIONEX) 10-8 MG/5ML SMARTSIG:5 Milliliter(s) By Mouth Daily PRN 03/24/23   [provider]  ipratropium (ATROVENT ) 0.06 % nasal spray Place 2 sprays into both nostrils 4 (four) times daily. 12/27/19 06/05/20  Babara Greig GAILS, PA-C    Family History Family History  Problem Relation Age of Onset   Hypertension Mother    Diabetes Mother    Heart failure Sister    Obstructive Sleep Apnea Sister     Social History Social History   Tobacco Use   Smoking status: Former    Types: Cigars    Quit date: 2022    Years since quitting: 3.5   Smokeless tobacco: Never  Vaping Use   Vaping status: Never Used  Substance Use Topics  Alcohol use: Yes    Comment: occasionally   Drug use: Yes    Frequency: 2.0 times per week    Types: Marijuana    Comment: Occassionally.     Allergies   Patient has no known allergies.   Review of Systems Review of Systems   Physical Exam Triage Vital Signs ED Triage Vitals  Encounter Vitals Group     BP 03/04/24 0817 128/85     Girls Systolic BP Percentile --      Girls Diastolic BP Percentile --      Boys Systolic BP Percentile --      Boys Diastolic BP Percentile --      Pulse Rate 03/04/24 0817 67     Resp 03/04/24 0817 20     Temp 03/04/24  0817 97.9 F (36.6 C)     Temp Source 03/04/24 0817 Oral     SpO2 03/04/24 0817 95 %     Weight 03/04/24 0814 270 lb (122.5 kg)     Height 03/04/24 0814 5' 9 (1.753 m)     Head Circumference --      Peak Flow --      Pain Score 03/04/24 0809 4     Pain Loc --      Pain Education --      Exclude from Growth Chart --    No data found.  Updated Vital Signs BP 128/85 (BP Location: Left Arm)   Pulse 67   Temp 97.9 F (36.6 C) (Oral)   Resp 20   Ht 5' 9 (1.753 m)   Wt 122.5 kg   SpO2 95%   BMI 39.87 kg/m   Visual Acuity Right Eye Distance:   Left Eye Distance:   Bilateral Distance:    Right Eye Near:   Left Eye Near:    Bilateral Near:     Physical Exam Vitals reviewed.  Constitutional:      General: He is not in acute distress.    Appearance: He is not ill-appearing, toxic-appearing or diaphoretic.  HENT:     Mouth/Throat:     Mouth: Mucous membranes are moist.  Cardiovascular:     Rate and Rhythm: Normal rate and regular rhythm.  Pulmonary:     Effort: Pulmonary effort is normal. No respiratory distress.     Breath sounds: Normal breath sounds. No stridor. No wheezing, rhonchi or rales.  Skin:    Coloration: Skin is not jaundiced or pale.     Comments: There is an area of erythema and induration and mild tenderness about 1.5 cm in diameter in his right lower quadrant abdominal wall.  There is no fluctuance.  There is no eschar    Neurological:     General: No focal deficit present.     Mental Status: He is alert and oriented to person, place, and time.  Psychiatric:        Behavior: Behavior normal.      UC Treatments / Results  Labs (all labs ordered are listed, but only abnormal results are displayed) Labs Reviewed - No data to display  EKG   Radiology No results found.  Procedures Procedures (including critical care time)  Medications Ordered in UC Medications - No data to display  Initial Impression / Assessment and Plan / UC Course   I have reviewed the triage vital signs and the nursing notes.  Pertinent labs & imaging results that were available during my care of the patient were reviewed by me and considered  in my medical decision making (see chart for details).      5-day burst of prednisone  and 5 days of Keflex  are sent in for what I think is most likely an insect bite but with the degree of discomfort I am going to cover for possible cellulitis. I have recommended Zyrtec /cetirizine  for the itching. Final Clinical Impressions(s) / UC Diagnoses   Final diagnoses:  Cellulitis of abdominal wall  Reaction to insect bite     Discharge Instructions      Take prednisone  20 mg--2 daily for 5 days  Cephalexin  500 mg --1 tablet by mouth 2 times daily for 7 days.  Take Zyrtec /cetirizine  10 mg over-the-counter--1 tablet daily as needed for itching     ED Prescriptions     Medication Sig Dispense Auth. Provider   predniSONE  (DELTASONE ) 20 MG tablet Take 2 tablets (40 mg total) by mouth daily with breakfast for 5 days. 10 tablet Vonna Sharlet POUR, MD   cephALEXin  (KEFLEX ) 500 MG capsule Take 1 capsule (500 mg total) by mouth 2 (two) times daily for 5 days. 10 capsule Montgomery Favor K, MD      PDMP not reviewed this encounter.   Vonna Sharlet POUR, MD 03/04/24 [phone]

## 2024-03-04 NOTE — ED Triage Notes (Signed)
 I have been bitten by something on the lower right side of my abd. No fever. I noticed it a few days ago.

## 2024-03-08 ENCOUNTER — Ambulatory Visit: Payer: Self-pay | Admitting: Adult Health

## 2024-05-16 ENCOUNTER — Ambulatory Visit (INDEPENDENT_AMBULATORY_CARE_PROVIDER_SITE_OTHER): Payer: Self-pay | Admitting: Family Medicine

## 2024-05-16 ENCOUNTER — Encounter: Payer: Self-pay | Admitting: Family Medicine

## 2024-05-16 VITALS — BP 135/87 | HR 64 | Ht 69.0 in | Wt 277.8 lb

## 2024-05-16 DIAGNOSIS — I10 Essential (primary) hypertension: Secondary | ICD-10-CM | POA: Diagnosis not present

## 2024-05-16 DIAGNOSIS — E785 Hyperlipidemia, unspecified: Secondary | ICD-10-CM | POA: Diagnosis not present

## 2024-05-16 DIAGNOSIS — E66813 Obesity, class 3: Secondary | ICD-10-CM

## 2024-05-16 DIAGNOSIS — Z6841 Body Mass Index (BMI) 40.0 and over, adult: Secondary | ICD-10-CM

## 2024-05-16 NOTE — Progress Notes (Unsigned)
 New Patient Office Visit  Subjective    Patient ID: Luis Beard, male    DOB: 1981-04-23  Age: 43 y.o. MRN: [id number]  CC:  Chief Complaint  Patient presents with   Medical Management of Chronic Issues    HPI Luis Beard presents to establish care ***  Outpatient Encounter Medications as of 05/16/2024  Medication Sig   amLODipine  (NORVASC ) 10 MG tablet Take 1 tablet (10 mg total) by mouth daily.   chlorpheniramine-HYDROcodone  (TUSSIONEX) 10-8 MG/5ML SMARTSIG:5 Milliliter(s) By Mouth Daily PRN   hydrochlorothiazide  (HYDRODIURIL ) 25 MG tablet Take 1 tablet (25 mg total) by mouth daily.   spironolactone  (ALDACTONE ) 50 MG tablet Take 1 tablet (50 mg total) by mouth daily.   valsartan  (DIOVAN ) 320 MG tablet Take 1 tablet (320 mg total) by mouth daily.   albuterol  (VENTOLIN  HFA) 108 (90 Base) MCG/ACT inhaler Inhale into the lungs. (Patient not taking: Reported on 05/16/2024)   [DISCONTINUED] ipratropium (ATROVENT ) 0.06 % nasal spray Place 2 sprays into both nostrils 4 (four) times daily.   No facility-administered encounter medications on file as of 05/16/2024.    Past Medical History:  Diagnosis Date   Asthma    Hypertension    Sleep apnea     Past Surgical History:  Procedure Laterality Date   ADENOIDECTOMY N/A 03/26/2022   Procedure: ADENOIDECTOMY;  Surgeon: Carlie Clark, MD;  Location: [facility];  Service: ENT;  Laterality: N/A;   NASAL TURBINATE REDUCTION Bilateral 03/26/2022   Procedure: TURBINATE REDUCTION/SUBMUCOSAL RESECTION;  Surgeon: Carlie Clark, MD;  Location: [facility];  Service: ENT;  Laterality: Bilateral;   SEPTOPLASTY Bilateral 03/26/2022   Procedure: SEPTOPLASTY;  Surgeon: Carlie Clark, MD;  Location: [facility];  Service: ENT;  Laterality: Bilateral;    Family History  Problem Relation Age of Onset   Hypertension Mother    Diabetes Mother    Heart failure Sister    Obstructive Sleep Apnea Sister      Social History   Socioeconomic History   Marital status: Married    Spouse name: Not on file   Number of children: Not on file   Years of education: Not on file   Highest education level: GED or equivalent  Occupational History   Not on file  Tobacco Use   Smoking status: Former    Types: Cigars    Quit date: 2022    Years since quitting: 3.7   Smokeless tobacco: Never  Vaping Use   Vaping status: Never Used  Substance and Sexual Activity   Alcohol use: Yes    Comment: occasionally   Drug use: Yes    Frequency: 2.0 times per week    Types: Marijuana    Comment: Occassionally.   Sexual activity: Yes    Birth control/protection: Condom  Other Topics Concern   Not on file  Social History Narrative   Caffiene soda 2-3 week.   Working daytime    Social Drivers of Corporate investment banker Strain: Low Risk  (03/08/2023)   Overall Financial Resource Strain (CARDIA)    Difficulty of Paying Living Expenses: Not very hard  Food Insecurity: No Food Insecurity (03/08/2023)   Hunger Vital Sign    Worried About Running Out of Food in the Last Year: Never true    Ran Out of Food in the Last Year: Never true  Transportation Needs: No Transportation Needs (03/08/2023)   PRAPARE - Administrator, Civil Service (Medical): No  Lack of Transportation (Non-Medical): No  Physical Activity: Insufficiently Active (03/08/2023)   Exercise Vital Sign    Days of Exercise per Week: 1 day    Minutes of Exercise per Session: 10 min  Stress: No Stress Concern Present (03/08/2023)   [employer] of Occupational Health - Occupational Stress Questionnaire    Feeling of Stress : Not at all  Social Connections: Moderately Isolated (03/08/2023)   Social Connection and Isolation Panel    Frequency of Communication with Friends and Family: More than three times a week    Frequency of Social Gatherings with Friends and Family: Once a week    Attends Religious Services: Never    Automotive engineer or Organizations: No    Attends Engineer, structural: Not on file    Marital Status: Married  Catering manager Violence: Not on file    ROS      Objective   BP 135/87   Pulse 64   Ht 5' 9 (1.753 m)   Wt 277 lb 12.8 oz (126 kg)   SpO2 91%   BMI 41.02 kg/m   Physical Exam  {Labs (Optional):23779}    Assessment & Plan:   There are no diagnoses linked to this encounter.   No follow-ups on file.   Tanda Raguel SQUIBB, MD

## 2024-05-17 ENCOUNTER — Encounter: Payer: Self-pay | Admitting: Family Medicine

## 2024-05-17 NOTE — Progress Notes (Signed)
 Established Patient Office Visit  Subjective    Patient ID: Luis Beard, male    DOB: 05-02-1981  Age: 43 y.o. MRN: [id number]  CC:  Chief Complaint  Patient presents with   Medical Management of Chronic Issues    HPI Luis Beard presents for routine follow up of chronic med issues including hypertension. Patient reports med compliance and denies acute complaints.   Outpatient Encounter Medications as of 05/16/2024  Medication Sig   amLODipine  (NORVASC ) 10 MG tablet Take 1 tablet (10 mg total) by mouth daily.   chlorpheniramine-HYDROcodone  (TUSSIONEX) 10-8 MG/5ML SMARTSIG:5 Milliliter(s) By Mouth Daily PRN   hydrochlorothiazide  (HYDRODIURIL ) 25 MG tablet Take 1 tablet (25 mg total) by mouth daily.   spironolactone  (ALDACTONE ) 50 MG tablet Take 1 tablet (50 mg total) by mouth daily.   valsartan  (DIOVAN ) 320 MG tablet Take 1 tablet (320 mg total) by mouth daily.   albuterol  (VENTOLIN  HFA) 108 (90 Base) MCG/ACT inhaler Inhale into the lungs. (Patient not taking: Reported on 05/16/2024)   [DISCONTINUED] ipratropium (ATROVENT ) 0.06 % nasal spray Place 2 sprays into both nostrils 4 (four) times daily.   No facility-administered encounter medications on file as of 05/16/2024.    Past Medical History:  Diagnosis Date   Asthma    Hypertension    Sleep apnea     Past Surgical History:  Procedure Laterality Date   ADENOIDECTOMY N/A 03/26/2022   Procedure: ADENOIDECTOMY;  Surgeon: Carlie Clark, MD;  Location: [facility];  Service: ENT;  Laterality: N/A;   NASAL TURBINATE REDUCTION Bilateral 03/26/2022   Procedure: TURBINATE REDUCTION/SUBMUCOSAL RESECTION;  Surgeon: Carlie Clark, MD;  Location: [facility];  Service: ENT;  Laterality: Bilateral;   SEPTOPLASTY Bilateral 03/26/2022   Procedure: SEPTOPLASTY;  Surgeon: Carlie Clark, MD;  Location: [facility];  Service: ENT;  Laterality: Bilateral;    Family History  Problem Relation Age  of Onset   Hypertension Mother    Diabetes Mother    Heart failure Sister    Obstructive Sleep Apnea Sister     Social History   Socioeconomic History   Marital status: Married    Spouse name: Not on file   Number of children: Not on file   Years of education: Not on file   Highest education level: GED or equivalent  Occupational History   Not on file  Tobacco Use   Smoking status: Former    Types: Cigars    Quit date: 2022    Years since quitting: 3.7   Smokeless tobacco: Never  Vaping Use   Vaping status: Never Used  Substance and Sexual Activity   Alcohol use: Yes    Comment: occasionally   Drug use: Yes    Frequency: 2.0 times per week    Types: Marijuana    Comment: Occassionally.   Sexual activity: Yes    Birth control/protection: Condom  Other Topics Concern   Not on file  Social History Narrative   Caffiene soda 2-3 week.   Working daytime    Social Drivers of Corporate investment banker Strain: Low Risk  (03/08/2023)   Overall Financial Resource Strain (CARDIA)    Difficulty of Paying Living Expenses: Not very hard  Food Insecurity: No Food Insecurity (03/08/2023)   Hunger Vital Sign    Worried About Running Out of Food in the Last Year: Never true    Ran Out of Food in the Last Year: Never true  Transportation Needs: No Transportation Needs (03/08/2023)  PRAPARE - Administrator, Civil Service (Medical): No    Lack of Transportation (Non-Medical): No  Physical Activity: Insufficiently Active (03/08/2023)   Exercise Vital Sign    Days of Exercise per Week: 1 day    Minutes of Exercise per Session: 10 min  Stress: No Stress Concern Present (03/08/2023)   [employer] of Occupational Health - Occupational Stress Questionnaire    Feeling of Stress : Not at all  Social Connections: Moderately Isolated (03/08/2023)   Social Connection and Isolation Panel    Frequency of Communication with Friends and Family: More than three times a week     Frequency of Social Gatherings with Friends and Family: Once a week    Attends Religious Services: Never    Database administrator or Organizations: No    Attends Engineer, structural: Not on file    Marital Status: Married  Catering manager Violence: Not on file    Review of Systems  All other systems reviewed and are negative.       Objective    BP 135/87   Pulse 64   Ht 5' 9 (1.753 m)   Wt 277 lb 12.8 oz (126 kg)   SpO2 91%   BMI 41.02 kg/m   Physical Exam Vitals and nursing note reviewed.  Constitutional:      General: He is not in acute distress.    Appearance: He is obese.  Cardiovascular:     Rate and Rhythm: Normal rate and regular rhythm.  Pulmonary:     Effort: Pulmonary effort is normal.     Breath sounds: Normal breath sounds.  Abdominal:     Palpations: Abdomen is soft.     Tenderness: There is no abdominal tenderness.  Musculoskeletal:     Right lower leg: No edema.     Left lower leg: No edema.  Neurological:     General: No focal deficit present.     Mental Status: He is alert and oriented to person, place, and time.         Assessment & Plan:   Essential hypertension  Class 3 severe obesity due to excess calories with serious comorbidity and body mass index (BMI) of 45.0 to 49.9 in adult  Hyperlipidemia, unspecified hyperlipidemia type     Return in about 6 months (around 11/13/2024) for physical.   Tanda Raguel SQUIBB, MD

## 2024-06-11 ENCOUNTER — Other Ambulatory Visit: Payer: Self-pay | Admitting: Family Medicine

## 2024-06-22 ENCOUNTER — Other Ambulatory Visit: Payer: Self-pay | Admitting: Family Medicine

## 2024-08-08 ENCOUNTER — Encounter: Payer: Self-pay | Admitting: Neurology

## 2024-08-08 ENCOUNTER — Ambulatory Visit: Admitting: Neurology

## 2024-09-26 ENCOUNTER — Emergency Department (HOSPITAL_BASED_OUTPATIENT_CLINIC_OR_DEPARTMENT_OTHER)

## 2024-09-26 ENCOUNTER — Encounter (HOSPITAL_BASED_OUTPATIENT_CLINIC_OR_DEPARTMENT_OTHER): Payer: Self-pay | Admitting: Emergency Medicine

## 2024-09-26 ENCOUNTER — Emergency Department (HOSPITAL_BASED_OUTPATIENT_CLINIC_OR_DEPARTMENT_OTHER)
Admission: EM | Admit: 2024-09-26 | Discharge: 2024-09-26 | Disposition: A | Discharge status: Home / Self Care | Attending: Emergency Medicine | Admitting: Emergency Medicine

## 2024-09-26 ENCOUNTER — Other Ambulatory Visit: Payer: Self-pay

## 2024-09-26 DIAGNOSIS — R1084 Generalized abdominal pain: Secondary | ICD-10-CM | POA: Diagnosis present

## 2024-09-26 DIAGNOSIS — I10 Essential (primary) hypertension: Secondary | ICD-10-CM | POA: Diagnosis not present

## 2024-09-26 LAB — COMPREHENSIVE METABOLIC PANEL WITH GFR
ALT: 17 U/L (ref 0–44)
AST: 16 U/L (ref 15–41)
Albumin: 4.7 g/dL (ref 3.5–5.0)
Alkaline Phosphatase: 89 U/L (ref 38–126)
Anion gap: 13 (ref 5–15)
BUN: 10 mg/dL (ref 6–20)
CO2: 24 mmol/L (ref 22–32)
Calcium: 9.8 mg/dL (ref 8.9–10.3)
Chloride: 102 mmol/L (ref 98–111)
Creatinine, Ser: 1.17 mg/dL (ref 0.61–1.24)
GFR, Estimated: >60 mL/min
Glucose, Bld: 83 mg/dL (ref 70–99)
Potassium: 3.9 mmol/L (ref 3.5–5.1)
Sodium: 138 mmol/L (ref 135–145)
Total Bilirubin: 1.2 mg/dL (ref 0.0–1.2)
Total Protein: 7.7 g/dL (ref 6.5–8.1)

## 2024-09-26 LAB — URINALYSIS, ROUTINE W REFLEX MICROSCOPIC
Bacteria, UA: NONE SEEN
Bilirubin Urine: NEGATIVE
Glucose, UA: NEGATIVE mg/dL
Hgb urine dipstick: NEGATIVE
Leukocytes,Ua: NEGATIVE
Nitrite: NEGATIVE
Protein, ur: 30 mg/dL — AB
Specific Gravity, Urine: 1.038 — ABNORMAL HIGH (ref 1.005–1.030)
pH: 6 (ref 5.0–8.0)

## 2024-09-26 LAB — CBC
HCT: 51.2 % (ref 39.0–52.0)
Hemoglobin: 16.7 g/dL (ref 13.0–17.0)
MCH: 26.9 pg (ref 26.0–34.0)
MCHC: 32.6 g/dL (ref 30.0–36.0)
MCV: 82.4 fL (ref 80.0–100.0)
Platelets: 244 10*3/uL (ref 150–400)
RBC: 6.21 MIL/uL — ABNORMAL HIGH (ref 4.22–5.81)
RDW: 13.7 % (ref 11.5–15.5)
WBC: 7 10*3/uL (ref 4.0–10.5)
nRBC: 0 % (ref 0.0–0.2)

## 2024-09-26 LAB — LIPASE, BLOOD: Lipase: 31 U/L (ref 11–51)

## 2024-09-26 MED ORDER — MORPHINE SULFATE (PF) 4 MG/ML IV SOLN
4.0000 mg | Freq: Once | INTRAVENOUS | Status: AC
  Administered 2024-09-26: 4 mg via INTRAVENOUS
  Filled 2024-09-26: qty 1

## 2024-09-26 MED ORDER — IOHEXOL 300 MG/ML  SOLN
100.0000 mL | Freq: Once | INTRAMUSCULAR | Status: AC | PRN
  Administered 2024-09-26: 100 mL via INTRAVENOUS

## 2024-09-26 NOTE — ED Triage Notes (Signed)
 Patient with a known hernia and now having abdominal pain, nausea, poor PO intake x3 days.

## 2024-09-26 NOTE — Discharge Instructions (Signed)
 You have been evaluated for your abdominal pain.  Fortunately your blood work as well as the CT scan that was obtained today did not show any concerning finding.  No evidence of hernia.  Please call and follow-up closely with your primary care doctor for outpatient management.  Return if you have any concern.

## 2024-09-26 NOTE — ED Notes (Signed)
 Patient transported to CT

## 2024-09-26 NOTE — ED Provider Notes (Signed)
 " [facility] AT [facility] Provider Note   CSN: [id number] Arrival date & time: 09/26/24  1657     Patient presents with: Nausea and Abdominal Pain   Luis Beard is a 44 y.o. male.   The history is provided by the patient and medical records. No language interpreter was used.  Abdominal Pain    44 year old male with history of hypertension, sleep apnea, abdominal hernia presenting with complaint of abdominal pain.  Patient states for the past 3 days he has had pain to his mid abdomen.  He described pain as a uncomfortable sensation with associate nausea.  He has not had a bowel movement for several days as well and unsure if he is able to pass flatus.  He feels like he is having a hernia to his abdomen.  He denies any prior abdominal surgeries no fever no chills no chest pain shortness of breath or urinary symptoms.  Denies any specific treatment tried at home.  Prior to Admission medications  Medication Sig Start Date End Date Taking? Authorizing Provider  albuterol  (VENTOLIN  HFA) 108 (90 Base) MCG/ACT inhaler Inhale into the lungs. Patient not taking: Reported on 05/16/2024 09/06/14   [provider]  amLODipine  (NORVASC ) 10 MG tablet TAKE 1 TABLET BY MOUTH EVERY DAY 06/13/24   Tanda Bleacher, MD  chlorpheniramine-HYDROcodone  (TUSSIONEX) 10-8 MG/5ML SMARTSIG:5 Milliliter(s) By Mouth Daily PRN 03/24/23   [provider]  hydrochlorothiazide  (HYDRODIURIL ) 25 MG tablet Take 1 tablet (25 mg total) by mouth daily. 11/12/23   Tanda Bleacher, MD  spironolactone  (ALDACTONE ) 50 MG tablet TAKE 1 TABLET BY MOUTH EVERY DAY 06/24/24   Tanda Bleacher, MD  valsartan  (DIOVAN ) 320 MG tablet TAKE 1 TABLET BY MOUTH EVERY DAY 06/13/24   Tanda Bleacher, MD  ipratropium (ATROVENT ) 0.06 % nasal spray Place 2 sprays into both nostrils 4 (four) times daily. 12/27/19 06/05/20  Babara Greig GAILS, PA-C    Allergies: Patient has no known allergies.    Review of Systems   Gastrointestinal:  Positive for abdominal pain.  All other systems reviewed and are negative.   Updated Vital Signs BP (!) 141/110 (BP Location: Left Arm)   Pulse 73   Temp 98.6 F (37 C) (Oral)   Resp 18   Ht 5' 9 (1.753 m)   Wt 126 kg   SpO2 96%   BMI 41.02 kg/m   Physical Exam Constitutional:      General: He is not in acute distress.    Appearance: He is well-developed. He is obese.  HENT:     Head: Atraumatic.  Eyes:     Conjunctiva/sclera: Conjunctivae normal.  Cardiovascular:     Rate and Rhythm: Normal rate and regular rhythm.     Pulses: Normal pulses.     Heart sounds: Normal heart sounds.  Abdominal:     Tenderness: There is abdominal tenderness (Tenderness to periumbilical region on palpation but no hernia noted, bowel sounds present.).  Musculoskeletal:     Cervical back: Normal range of motion and neck supple.  Skin:    Findings: No rash.  Neurological:     Mental Status: He is alert.     (all labs ordered are listed, but only abnormal results are displayed) Labs Reviewed  CBC - Abnormal; Notable for the following components:      Result Value   RBC 6.21 (*)    All other components within normal limits  URINALYSIS, ROUTINE W REFLEX MICROSCOPIC - Abnormal; Notable for the following components:  Specific Gravity, Urine 1.038 (*)    Ketones, ur TRACE (*)    Protein, ur 30 (*)    All other components within normal limits  LIPASE, BLOOD  COMPREHENSIVE METABOLIC PANEL WITH GFR    EKG: None  Radiology: CT ABDOMEN PELVIS W CONTRAST Result Date: 09/26/2024 EXAM: CT ABDOMEN AND PELVIS WITH CONTRAST 09/26/2024 07:11:17 PM TECHNIQUE: CT of the abdomen and pelvis was performed with the administration of 100 mL of iohexol  (OMNIPAQUE ) 300 MG/ML solution. Multiplanar reformatted images are provided for review. Automated exposure control, iterative reconstruction, and/or weight-based adjustment of the mA/kV was utilized to reduce the radiation dose to as  low as reasonably achievable. COMPARISON: 09/12/2014 CLINICAL HISTORY: Acute abdominal pain. FINDINGS: LOWER CHEST: No acute abnormality. LIVER: The liver is unremarkable. GALLBLADDER AND BILE DUCTS: Gallbladder is unremarkable. No biliary ductal dilatation. SPLEEN: No acute abnormality. PANCREAS: No acute abnormality. ADRENAL GLANDS: No acute abnormality. KIDNEYS, URETERS AND BLADDER: No renal calculi or obstructive changes are noted. No hydronephrosis. No perinephric or periureteral stranding. Bladder is decompressed. GI AND BOWEL: Stomach demonstrates no acute abnormality. The small bowel is unremarkable. No obstructive or inflammatory changes of the colon are seen. The appendix is within normal limits. There is no bowel obstruction. PERITONEUM AND RETROPERITONEUM: No ascites. No free air. VASCULATURE: Aorta is normal in caliber. LYMPH NODES: No lymphadenopathy. REPRODUCTIVE ORGANS: Prostate is within normal limits. BONES AND SOFT TISSUES: No acute osseous abnormality. A small fat-containing umbilical hernia is seen. No bowel is noted within. No focal soft tissue abnormality. IMPRESSION: 1. No acute findings in the abdomen or pelvis. Electronically signed by: Oneil Devonshire MD 09/26/2024 07:23 PM EST RP Workstation: [id number]     Procedures   Medications Ordered in the ED  iohexol  (OMNIPAQUE ) 300 MG/ML solution 100 mL (100 mLs Intravenous Contrast Given 09/26/24 1902)  morphine  (PF) 4 MG/ML injection 4 mg (4 mg Intravenous Given 09/26/24 1919)                                    Medical Decision Making Amount and/or Complexity of Data Reviewed Labs: ordered. Radiology: ordered.  Risk Prescription drug management.   BP (!) 141/110 (BP Location: Left Arm)   Pulse 73   Temp 98.6 F (37 C) (Oral)   Resp 18   Ht 5' 9 (1.753 m)   Wt 126 kg   SpO2 96%   BMI 41.02 kg/m   19:23 PM  44 year old male with history of hypertension, sleep apnea, abdominal hernia presenting with complaint of  abdominal pain.  Patient states for the past 3 days he has had pain to his mid abdomen.  He described pain as a uncomfortable sensation with associate nausea.  He has not had a bowel movement for several days as well and unsure if he is able to pass flatus.  He feels like he is having a hernia to his abdomen.  He denies any prior abdominal surgeries no fever no chills no chest pain shortness of breath or urinary symptoms.  Denies any specific treatment tried at home.  On exam patient does have tenderness to mid abdomen however no obvious hernia appreciated.  Bowel sounds are present.  Given his discomfort, CT scan of the abdomen pelvis ordered.  -Labs ordered, independently viewed and interpreted by me.  Labs remarkable for reassuring lab values -The patient was maintained on a cardiac monitor.  I personally viewed and interpreted the cardiac  monitored which showed an underlying rhythm of: SR -Imaging independently viewed and interpreted by me and I agree with radiologist's interpretation.  Result remarkable for abdominal pelvis CT scan without any concerning finding -This patient presents to the ED for concern of abdominal pain, this involves an extensive number of treatment options, and is a complaint that carries with it a high risk of complications and morbidity.  The differential diagnosis includes incarcerated hernia, colitis, diverticulitis, pancreatitis, cholecystitis, appendicitis, GERD, gastritis, SBO, viral illness -Co morbidities that complicate the patient evaluation includes hypertension, sleep apnea -Treatment includes morphine  -Reevaluation of the patient after these medicines showed that the patient improved -PCP office notes or outside notes reviewed -Escalation to admission/observation considered: patients feels much better, is comfortable with discharge, and will follow up with PCP -Prescription medication considered, patient comfortable with OTC medication -Social Determinant of  Health considered which includes tobacco use      Final diagnoses:  Generalized abdominal pain    ED Discharge Orders     None          Nivia Colon, PA-C 09/26/24 2046  "

## 2024-11-14 ENCOUNTER — Encounter: Admitting: Family Medicine

## 2024-11-24 ENCOUNTER — Ambulatory Visit: Admitting: Neurology
